# Patient Record
Sex: Male | Born: 1955 | Race: White | Hispanic: No | Marital: Single | Smoking: Current every day smoker
Health system: Southern US, Community
[De-identification: ages and names within clinical notes are randomized; demographics above are authoritative.]

## PROBLEM LIST (undated history)

## (undated) DIAGNOSIS — I517 Cardiomegaly: Secondary | ICD-10-CM

## (undated) DIAGNOSIS — I639 Cerebral infarction, unspecified: Secondary | ICD-10-CM

## (undated) DIAGNOSIS — R2231 Localized swelling, mass and lump, right upper limb: Secondary | ICD-10-CM

## (undated) DIAGNOSIS — E7800 Pure hypercholesterolemia, unspecified: Secondary | ICD-10-CM

## (undated) DIAGNOSIS — G510 Bell's palsy: Secondary | ICD-10-CM

## (undated) DIAGNOSIS — I2510 Atherosclerotic heart disease of native coronary artery without angina pectoris: Secondary | ICD-10-CM

## (undated) DIAGNOSIS — K8050 Calculus of bile duct without cholangitis or cholecystitis without obstruction: Secondary | ICD-10-CM

## (undated) DIAGNOSIS — I5189 Other ill-defined heart diseases: Secondary | ICD-10-CM

## (undated) DIAGNOSIS — I739 Peripheral vascular disease, unspecified: Secondary | ICD-10-CM

## (undated) DIAGNOSIS — I10 Essential (primary) hypertension: Secondary | ICD-10-CM

## (undated) DIAGNOSIS — K649 Unspecified hemorrhoids: Secondary | ICD-10-CM

## (undated) DIAGNOSIS — Z720 Tobacco use: Secondary | ICD-10-CM

## (undated) HISTORY — PX: VARICOSE VEIN SURGERY: SHX832

## (undated) HISTORY — DX: Atherosclerotic heart disease of native coronary artery without angina pectoris: I25.10

## (undated) HISTORY — DX: Cardiomegaly: I51.7

## (undated) HISTORY — DX: Peripheral vascular disease, unspecified: I73.9

## (undated) HISTORY — DX: Other ill-defined heart diseases: I51.89

---

## 2004-11-21 ENCOUNTER — Emergency Department: Payer: Self-pay | Admitting: General Practice

## 2007-02-05 ENCOUNTER — Emergency Department: Payer: Self-pay | Admitting: Emergency Medicine

## 2009-09-11 ENCOUNTER — Emergency Department: Payer: Self-pay | Admitting: Internal Medicine

## 2011-04-22 ENCOUNTER — Emergency Department: Payer: Self-pay | Admitting: Emergency Medicine

## 2011-04-22 LAB — CBC
HGB: 15.7 g/dL (ref 13.0–18.0)
Platelet: 186 10*3/uL (ref 150–440)
RBC: 4.85 10*6/uL (ref 4.40–5.90)
RDW: 12.5 % (ref 11.5–14.5)
WBC: 4.3 10*3/uL (ref 3.8–10.6)

## 2011-04-22 LAB — URINALYSIS, COMPLETE
Bilirubin,UR: NEGATIVE
Blood: NEGATIVE
Ketone: NEGATIVE
Protein: NEGATIVE
RBC,UR: <1 /HPF (ref 0–5)
Specific Gravity: 1.004 (ref 1.003–1.030)
Squamous Epithelial: NONE SEEN
WBC UR: NONE SEEN /HPF (ref 0–5)

## 2011-04-22 LAB — COMPREHENSIVE METABOLIC PANEL
Alkaline Phosphatase: 56 U/L (ref 50–136)
Anion Gap: 9 (ref 7–16)
Calcium, Total: 8.6 mg/dL (ref 8.5–10.1)
Chloride: 106 mmol/L (ref 98–107)
Co2: 28 mmol/L (ref 21–32)
EGFR (African American): >60
Glucose: 99 mg/dL (ref 65–99)
Potassium: 3.8 mmol/L (ref 3.5–5.1)
SGOT(AST): 21 U/L (ref 15–37)

## 2011-04-26 ENCOUNTER — Emergency Department: Payer: Self-pay | Admitting: Emergency Medicine

## 2011-04-26 LAB — COMPREHENSIVE METABOLIC PANEL
Alkaline Phosphatase: 68 U/L (ref 50–136)
Anion Gap: 8 (ref 7–16)
BUN: 14 mg/dL (ref 7–18)
Calcium, Total: 9.7 mg/dL (ref 8.5–10.1)
Chloride: 101 mmol/L (ref 98–107)
EGFR (African American): >60
EGFR (Non-African Amer.): >60
SGOT(AST): 17 U/L (ref 15–37)
SGPT (ALT): 26 U/L
Total Protein: 7.9 g/dL (ref 6.4–8.2)

## 2011-04-26 LAB — CBC
MCH: 32.1 pg (ref 26.0–34.0)
MCHC: 33.8 g/dL (ref 32.0–36.0)
MCV: 95 fL (ref 80–100)
Platelet: 213 10*3/uL (ref 150–440)
RBC: 4.73 10*6/uL (ref 4.40–5.90)
RDW: 13.6 % (ref 11.5–14.5)

## 2016-03-19 ENCOUNTER — Emergency Department: Payer: Self-pay

## 2016-03-19 ENCOUNTER — Observation Stay
Admission: EM | Admit: 2016-03-19 | Discharge: 2016-03-20 | Disposition: A | Discharge status: Home / Self Care | Payer: Self-pay | Attending: Internal Medicine | Admitting: Internal Medicine

## 2016-03-19 DIAGNOSIS — Z7902 Long term (current) use of antithrombotics/antiplatelets: Secondary | ICD-10-CM | POA: Insufficient documentation

## 2016-03-19 DIAGNOSIS — Z79899 Other long term (current) drug therapy: Secondary | ICD-10-CM | POA: Insufficient documentation

## 2016-03-19 DIAGNOSIS — Z9119 Patient's noncompliance with other medical treatment and regimen: Secondary | ICD-10-CM | POA: Insufficient documentation

## 2016-03-19 DIAGNOSIS — J40 Bronchitis, not specified as acute or chronic: Secondary | ICD-10-CM | POA: Insufficient documentation

## 2016-03-19 DIAGNOSIS — Z8673 Personal history of transient ischemic attack (TIA), and cerebral infarction without residual deficits: Secondary | ICD-10-CM | POA: Insufficient documentation

## 2016-03-19 DIAGNOSIS — Z791 Long term (current) use of non-steroidal anti-inflammatories (NSAID): Secondary | ICD-10-CM | POA: Insufficient documentation

## 2016-03-19 DIAGNOSIS — Z7982 Long term (current) use of aspirin: Secondary | ICD-10-CM | POA: Insufficient documentation

## 2016-03-19 DIAGNOSIS — R079 Chest pain, unspecified: Secondary | ICD-10-CM | POA: Diagnosis present

## 2016-03-19 DIAGNOSIS — Z8249 Family history of ischemic heart disease and other diseases of the circulatory system: Secondary | ICD-10-CM | POA: Insufficient documentation

## 2016-03-19 DIAGNOSIS — I200 Unstable angina: Secondary | ICD-10-CM | POA: Insufficient documentation

## 2016-03-19 DIAGNOSIS — I10 Essential (primary) hypertension: Secondary | ICD-10-CM | POA: Insufficient documentation

## 2016-03-19 DIAGNOSIS — G510 Bell's palsy: Secondary | ICD-10-CM | POA: Insufficient documentation

## 2016-03-19 DIAGNOSIS — F17210 Nicotine dependence, cigarettes, uncomplicated: Secondary | ICD-10-CM | POA: Insufficient documentation

## 2016-03-19 DIAGNOSIS — J111 Influenza due to unidentified influenza virus with other respiratory manifestations: Principal | ICD-10-CM | POA: Insufficient documentation

## 2016-03-19 HISTORY — DX: Tobacco use: Z72.0

## 2016-03-19 HISTORY — DX: Cerebral infarction, unspecified: I63.9

## 2016-03-19 HISTORY — DX: Bell's palsy: G51.0

## 2016-03-19 HISTORY — DX: Essential (primary) hypertension: I10

## 2016-03-19 LAB — BASIC METABOLIC PANEL
ANION GAP: 6 (ref 5–15)
BUN: 9 mg/dL (ref 6–20)
CALCIUM: 8.5 mg/dL — AB (ref 8.9–10.3)
CO2: 27 mmol/L (ref 22–32)
Chloride: 104 mmol/L (ref 101–111)
Creatinine, Ser: 1.03 mg/dL (ref 0.61–1.24)
GFR calc Af Amer: >60 mL/min (ref >60)
GLUCOSE: 112 mg/dL — AB (ref 65–99)
Potassium: 3.4 mmol/L — ABNORMAL LOW (ref 3.5–5.1)
SODIUM: 137 mmol/L (ref 135–145)

## 2016-03-19 LAB — CBC
HCT: 45.8 % (ref 40.0–52.0)
HEMOGLOBIN: 15.7 g/dL (ref 13.0–18.0)
MCH: 31.1 pg (ref 26.0–34.0)
MCHC: 34.4 g/dL (ref 32.0–36.0)
MCV: 90.4 fL (ref 80.0–100.0)
Platelets: 135 10*3/uL — ABNORMAL LOW (ref 150–440)
RBC: 5.06 MIL/uL (ref 4.40–5.90)
RDW: 13.8 % (ref 11.5–14.5)
WBC: 2.9 10*3/uL — ABNORMAL LOW (ref 3.8–10.6)

## 2016-03-19 LAB — TROPONIN I
TROPONIN I: 0.1 ng/mL — AB (ref <0.03)
Troponin I: 0.09 ng/mL (ref <0.03)

## 2016-03-19 LAB — INFLUENZA PANEL BY PCR (TYPE A & B)
Influenza A By PCR: NEGATIVE
Influenza B By PCR: POSITIVE — AB

## 2016-03-19 MED ORDER — BISACODYL 10 MG RE SUPP: 10.0000 mg | Freq: Every day | RECTAL | Status: DC | PRN

## 2016-03-19 MED ORDER — ONDANSETRON HCL 4 MG/2ML IJ SOLN: 4.0000 mg | Freq: Four times a day (QID) | INTRAMUSCULAR | Status: DC | PRN

## 2016-03-19 MED ORDER — AMLODIPINE BESYLATE 10 MG PO TABS
10.0000 mg | ORAL_TABLET | Freq: Every day | ORAL | Status: DC
  Administered 2016-03-19 – 2016-03-20 (×2): 10 mg via ORAL
  Filled 2016-03-19 (×2): qty 1

## 2016-03-19 MED ORDER — METHYLPREDNISOLONE SODIUM SUCC 125 MG IJ SOLR
60.0000 mg | Freq: Every day | INTRAMUSCULAR | Status: DC
Start: 2016-03-19 — End: 2016-03-20
  Administered 2016-03-19 – 2016-03-20 (×2): 60 mg via INTRAVENOUS
  Filled 2016-03-19 (×2): qty 2

## 2016-03-19 MED ORDER — SODIUM CHLORIDE 0.9% FLUSH: 3.0000 mL | INTRAVENOUS | Status: DC | PRN

## 2016-03-19 MED ORDER — OSELTAMIVIR PHOSPHATE 75 MG PO CAPS
75.0000 mg | ORAL_CAPSULE | Freq: Two times a day (BID) | ORAL | Status: DC
  Administered 2016-03-19 – 2016-03-20 (×2): 75 mg via ORAL
  Filled 2016-03-19 (×2): qty 1

## 2016-03-19 MED ORDER — NITROGLYCERIN 0.4 MG SL SUBL: 0.4000 mg | SUBLINGUAL_TABLET | SUBLINGUAL | Status: DC | PRN

## 2016-03-19 MED ORDER — ENOXAPARIN SODIUM 40 MG/0.4ML ~~LOC~~ SOLN
40.0000 mg | SUBCUTANEOUS | Status: DC
  Administered 2016-03-19: 40 mg via SUBCUTANEOUS
  Filled 2016-03-19: qty 0.4

## 2016-03-19 MED ORDER — IBUPROFEN 400 MG PO TABS: 400.0000 mg | ORAL_TABLET | Freq: Four times a day (QID) | ORAL | Status: DC | PRN

## 2016-03-19 MED ORDER — ASPIRIN 81 MG PO CHEW
324.0000 mg | CHEWABLE_TABLET | Freq: Once | ORAL | Status: AC
  Administered 2016-03-19: 324 mg via ORAL
  Filled 2016-03-19: qty 4

## 2016-03-19 MED ORDER — ACETAMINOPHEN 650 MG RE SUPP: 650.0000 mg | Freq: Four times a day (QID) | RECTAL | Status: DC | PRN

## 2016-03-19 MED ORDER — ASPIRIN EC 81 MG PO TBEC
81.0000 mg | DELAYED_RELEASE_TABLET | Freq: Every day | ORAL | Status: DC
  Administered 2016-03-20: 81 mg via ORAL
  Filled 2016-03-19: qty 1

## 2016-03-19 MED ORDER — IPRATROPIUM-ALBUTEROL 0.5-2.5 (3) MG/3ML IN SOLN: 3.0000 mL | RESPIRATORY_TRACT | Status: DC | PRN

## 2016-03-19 MED ORDER — ACETAMINOPHEN 325 MG PO TABS: 650.0000 mg | ORAL_TABLET | Freq: Four times a day (QID) | ORAL | Status: DC | PRN

## 2016-03-19 MED ORDER — ONDANSETRON HCL 4 MG PO TABS: 4.0000 mg | ORAL_TABLET | Freq: Four times a day (QID) | ORAL | Status: DC | PRN

## 2016-03-19 MED ORDER — NITROGLYCERIN 2 % TD OINT
1.0000 [in_us] | TOPICAL_OINTMENT | Freq: Once | TRANSDERMAL | Status: AC
  Administered 2016-03-19: 1 [in_us] via TOPICAL
  Filled 2016-03-19: qty 1

## 2016-03-19 MED ORDER — LISINOPRIL 20 MG PO TABS
40.0000 mg | ORAL_TABLET | Freq: Every day | ORAL | Status: DC
  Administered 2016-03-19 – 2016-03-20 (×2): 40 mg via ORAL
  Filled 2016-03-19 (×2): qty 2

## 2016-03-19 MED ORDER — GABAPENTIN 100 MG PO CAPS
100.0000 mg | ORAL_CAPSULE | Freq: Three times a day (TID) | ORAL | Status: DC
  Administered 2016-03-19 – 2016-03-20 (×2): 100 mg via ORAL
  Filled 2016-03-19 (×2): qty 1

## 2016-03-19 MED ORDER — SODIUM CHLORIDE 0.9% FLUSH
3.0000 mL | Freq: Two times a day (BID) | INTRAVENOUS | Status: DC
  Administered 2016-03-19 – 2016-03-20 (×2): 3 mL via INTRAVENOUS

## 2016-03-19 MED ORDER — POLYETHYLENE GLYCOL 3350 17 G PO PACK: 17.0000 g | PACK | Freq: Every day | ORAL | Status: DC | PRN

## 2016-03-19 MED ORDER — SODIUM CHLORIDE 0.9% FLUSH: 3.0000 mL | Freq: Two times a day (BID) | INTRAVENOUS | Status: DC

## 2016-03-19 MED ORDER — HYDRALAZINE HCL 20 MG/ML IJ SOLN: 10.0000 mg | Freq: Four times a day (QID) | INTRAMUSCULAR | Status: DC | PRN

## 2016-03-19 MED ORDER — SODIUM CHLORIDE 0.9 % IV SOLN: 250.0000 mL | INTRAVENOUS | Status: DC | PRN

## 2016-03-19 NOTE — H&P (Signed)
[place] [facility] [place] NAME: Miguel Fletcher    MR#:  [id number]  DATE OF BIRTH:  06-Sep-1955  DATE OF ADMISSION:  03/19/2016  PRIMARY CARE PHYSICIAN: No primary care provider on file.   REQUESTING/REFERRING PHYSICIAN: Dr. Marcelene Butte  CHIEF COMPLAINT:   Chief Complaint  Patient presents with  . Chest Pain    HISTORY OF PRESENT ILLNESS:  Miguel Fletcher  is a 60 y.o. male with a known history of Hypertension, Bell's palsy, CVA, tobacco use presents to the emergency room complaining of 3 weeks of on and off chest pain lasting 30 seconds to a minute. This is worse when he coughs. He has also noticed on and off coughing and wheezing. Continues to smoke. Afebrile. Patient was found to have troponin of 0.10 in the emergency room. EKG shows chronic changes. Influenza B positive. Accelerated hypertension as patient has not taken his medications for many weeks.  PAST MEDICAL HISTORY:   Past Medical History:  Diagnosis Date  . Bell's palsy   . Hypertension   . Stroke (cerebrum) ([place])   . Tobacco use     PAST SURGICAL HISTORY:  History reviewed. No pertinent surgical history.  SOCIAL HISTORY:   Social History  Substance Use Topics  . Smoking status: Current Every Day Smoker    Packs/day: 2.00  . Smokeless tobacco: Never Used  . Alcohol use No    FAMILY HISTORY:   Family History  Problem Relation Age of Onset  . CAD Neg Hx     DRUG ALLERGIES:  No Known Allergies  REVIEW OF SYSTEMS:   Review of Systems  Constitutional: Positive for malaise/fatigue. Negative for chills, fever and weight loss.  HENT: Negative for hearing loss and nosebleeds.   Eyes: Negative for blurred vision, double vision and pain.  Respiratory: Positive for cough, sputum production and wheezing. Negative for hemoptysis and shortness of breath.   Cardiovascular: Positive for chest pain. Negative for palpitations, orthopnea and leg swelling.  Gastrointestinal:  Negative for abdominal pain, constipation, diarrhea, nausea and vomiting.  Genitourinary: Negative for dysuria and hematuria.  Musculoskeletal: Negative for back pain, falls and myalgias.  Skin: Negative for rash.  Neurological: Positive for weakness. Negative for dizziness, tremors, sensory change, speech change, focal weakness, seizures and headaches.  Endo/Heme/Allergies: Does not bruise/bleed easily.  Psychiatric/Behavioral: Negative for depression and memory loss. The patient is not nervous/anxious.     MEDICATIONS AT HOME:   Prior to Admission medications   Medication Sig Start Date End Date Taking? Authorizing Provider  amLODipine (NORVASC) 10 MG tablet Take 10 mg by mouth daily. 08/19/15  Yes Historical Provider, MD  aspirin EC 81 MG tablet Take 81 mg by mouth daily. 08/04/14  Yes Historical Provider, MD  dextromethorphan-guaiFENesin (MUCINEX DM) 30-600 MG 12hr tablet Take 1 tablet by mouth 2 (two) times daily as needed for cough.   Yes Historical Provider, MD  gabapentin (NEURONTIN) 100 MG capsule Take 100 mg by mouth 3 (three) times daily.   Yes Historical Provider, MD  hydrochlorothiazide (HYDRODIURIL) 25 MG tablet Take 25 mg by mouth daily.   Yes Historical Provider, MD  Ibuprofen (ADVIL PO) Take 1 tablet by mouth every 4 (four) hours as needed (pain).   Yes Historical Provider, MD  lisinopril (PRINIVIL,ZESTRIL) 20 MG tablet Take 20 mg by mouth daily. 07/08/15  Yes Historical Provider, MD  nitroGLYCERIN (NITROSTAT) 0.4 MG SL tablet Place 0.4 mg under the tongue every 5 (five) minutes x 3 doses as needed.  08/04/14  Yes Historical Provider, MD     VITAL SIGNS:  Blood pressure (!) 196/77, pulse 64, temperature 99.9 F (37.7 C), temperature source Oral, resp. rate 16, height 5\' 9"  (1.753 m), weight 99.8 kg (220 lb), SpO2 94 %.  PHYSICAL EXAMINATION:  Physical Exam  GENERAL:  60 y.o.-year-old patient lying in the bed with no acute distress.  EYES: Pupils equal, round, reactive to  light and accommodation. No scleral icterus. Extraocular muscles intact.  HEENT: Head atraumatic, normocephalic. Oropharynx and nasopharynx clear. No oropharyngeal erythema, moist oral mucosa  NECK:  Supple, no jugular venous distention. No thyroid enlargement, no tenderness.  LUNGS: Bilateral wheezing. Good air entry. CARDIOVASCULAR: S1, S2 normal. No murmurs, rubs, or gallops.  ABDOMEN: Soft, nontender, nondistended. Bowel sounds present. No organomegaly or mass.  EXTREMITIES: No pedal edema, cyanosis, or clubbing. + 2 pedal & radial pulses b/l.   NEUROLOGIC: Cranial nerves II through XII are intact. No focal Motor or sensory deficits appreciated b/l PSYCHIATRIC: The patient is alert and oriented x 3. Good affect.  SKIN: No obvious rash, lesion, or ulcer.   LABORATORY PANEL:   CBC  Recent Labs Lab 03/19/16 1712  WBC 2.9*  HGB 15.7  HCT 45.8  PLT 135*   ------------------------------------------------------------------------------------------------------------------  Chemistries   Recent Labs Lab 03/19/16 1712  NA 137  K 3.4*  CL 104  CO2 27  GLUCOSE 112*  BUN 9  CREATININE 1.03  CALCIUM 8.5*   ------------------------------------------------------------------------------------------------------------------  Cardiac Enzymes  Recent Labs Lab 03/19/16 1712  TROPONINI 0.10*   ------------------------------------------------------------------------------------------------------------------  RADIOLOGY:  Dg Chest 2 View  Result Date: 03/19/2016 CLINICAL DATA:  Chest pain and shortness of breath.  Cough. EXAM: CHEST  2 VIEW COMPARISON:  04/22/2011 FINDINGS: The heart size and mediastinal contours are within normal limits. Both lungs are clear. Slight thoracolumbar scoliosis. IMPRESSION: No active cardiopulmonary disease. Electronically Signed   By: Lorriane Shire M.D.   On: 03/19/2016 18:30   IMPRESSION AND PLAN:   * Chest pain with mild elevated of  troponin Atypical chest pain. Likely due to accelerated hypertension and influenza Admit to telemetry. Repeat troponin. Start aspirin. Check echocardiogram and consult cardiology. If patient has further significant elevation in troponin he eats to be started on full dose Lovenox.  * Influenza B Start Tamiflu. Solu-Medrol. Nebs when necessary.  * Accelerated hypertension Restart patient's amlodipine and lisinopril from outpatient. Invalid blood pressure still elevated consider hydrochlorothiazide tomorrow. Add IV when necessary medications.  * DVT prophylaxis with Lovenox  All the records are reviewed and case discussed with ED provider. Management plans discussed with the patient, family and they are in agreement.  CODE STATUS: FULL CODE  TOTAL TIME TAKING CARE OF THIS PATIENT: 40 minutes.   Hillary Bow R M.D on 03/19/2016 at 8:02 PM  Between 7am to 6pm - Pager - [phone]  After 6pm go to [email or web address] - password [email or web address] [facility]  Office  [phone]  CC: Primary care physician; No primary care provider on file.  Note: This dictation was prepared with Dragon dictation along with smaller phrase technology. Any transcriptional errors that result from this process are unintentional.

## 2016-03-19 NOTE — ED Provider Notes (Signed)
Time Seen: Approximately 1826  I have reviewed the triage notes  Chief Complaint: Chest Pain   History of Present Illness: Miguel Fletcher is a 60 y.o. male who presents with some intermittent chest discomfort now for the last approximately week or more. He describes the episodes lasting anywhere from 15-20 minutes mainly substernal radiating into both upper extremities. Patient's had some cough and cold symptoms and apparently has felt febrile at home without taking his temperature. He also has history of hypertension and has not currently been taking his blood pressure medications. Patient arrives to the emergency department today due to chest pain episode that started approximately an hour prior to arrival and again lasted approximately 20 minutes. Patient denies any substernal chest discomfort at this time. He denies any headaches or focal weakness.   Past Medical History:  Diagnosis Date  . Hypertension   . Stroke (cerebrum) ([place])     There are no active problems to display for this patient.   History reviewed. No pertinent surgical history.  History reviewed. No pertinent surgical history.    Allergies:  Patient has no known allergies.  Family History: No family history on file.  Social History: Social History  Substance Use Topics  . Smoking status: Current Every Day Smoker    Packs/day: 2.00  . Smokeless tobacco: Never Used  . Alcohol use No     Review of Systems:   10 point review of systems was performed and was otherwise negative:  Constitutional: No fever Eyes: No visual disturbances ENT: No sore throat, ear pain Cardiac: No chest pain Respiratory: NoCurrent shortness of breath, wheezing, or stridor. He does have a dry nonproductive cough. Abdomen: No abdominal pain, no vomiting, No diarrhea Endocrine: No weight loss, No night sweats Extremities: No peripheral edema, cyanosis Skin: No rashes, easy bruising Neurologic: No focal weakness, trouble  with speech or swollowing Urologic: No dysuria, Hematuria, or urinary frequency   Physical Exam:  ED Triage Vitals  Enc Vitals Group     BP 03/19/16 1707 (!) 196/77     Pulse Rate 03/19/16 1707 64     Resp 03/19/16 1707 16     Temp 03/19/16 1707 99.9 F (37.7 C)     Temp Source 03/19/16 1707 Oral     SpO2 03/19/16 1707 94 %     Weight 03/19/16 1708 220 lb (99.8 kg)     Height 03/19/16 1708 5\' 9"  (1.753 m)     Head Circumference --      Peak Flow --      Pain Score --      Pain Loc --      Pain Edu? --      Excl. in [place]? --     General: Awake , Alert , and Oriented times 3; GCS 15 Head: Normal cephalic , atraumatic Eyes: Pupils equal , round, reactive to light Nose/Throat: No nasal drainage, patent upper airway without erythema or exudate.  Neck: Supple, Full range of motion, No anterior adenopathy or palpable thyroid masses Lungs: Clear to ascultation without wheezes , rhonchi, or rales Heart: Regular rate, regular rhythm without murmurs , gallops , or rubs Abdomen: Soft, non tender without rebound, guarding , or rigidity; bowel sounds positive and symmetric in all 4 quadrants. No organomegaly .        Extremities: 2 plus symmetric pulses. No edema, clubbing or cyanosis Neurologic: normal ambulation, Motor symmetric without deficits, sensory intact Skin: warm, dry, no rashes No obvious reproducible chest wall  pain  Labs:   All laboratory work was reviewed including any pertinent negatives or positives listed below:  Labs Reviewed  BASIC METABOLIC PANEL - Abnormal; Notable for the following:       Result Value   Potassium 3.4 (*)    Glucose, Bld 112 (*)    Calcium 8.5 (*)    All other components within normal limits  CBC - Abnormal; Notable for the following:    WBC 2.9 (*)    Platelets 135 (*)    All other components within normal limits  TROPONIN I - Abnormal; Notable for the following:    Troponin I 0.10 (*)    All other components within normal limits   INFLUENZA PANEL BY PCR (TYPE A & B, H1N1)   Troponin I. His elevated EKG:  ED ECG REPORT I, Daymon Larsen, the attending physician, personally viewed and interpreted this ECG.  Date: 03/19/2016 EKG Time: 1705 Rate:65 Rhythm: normal sinus rhythm QRS Axis: normal Intervals: Left anterior fascicular block ST/T Wave abnormalities: normal Conduction Disturbances: none Narrative Interpretation: unremarkable Left ventricular hypertrophy Poor R-wave progression in the septal leads which appears to be new in comparison EKG performed on 04-22-2011 No acute ischemic changes Radiology:  "Dg Chest 2 View  Result Date: 03/19/2016 CLINICAL DATA:  Chest pain and shortness of breath.  Cough. EXAM: CHEST  2 VIEW COMPARISON:  04/22/2011 FINDINGS: The heart size and mediastinal contours are within normal limits. Both lungs are clear. Slight thoracolumbar scoliosis. IMPRESSION: No active cardiopulmonary disease. Electronically Signed   By: Lorriane Shire M.D.   On: 03/19/2016 18:30  "  I personally reviewed the radiologic studies   ED Course:  Patient's stay here was uneventful he started on nitroglycerin paste for both hypertension and treatment for what could be unstable angina. Patient doesn't appear to have any obvious ischemic changes on his EKG does troponin is positive. Differential includes all life-threatening causes for chest pain. This includes but is not exclusive to acute coronary syndrome, aortic dissection, pulmonary embolism, cardiac tamponade, community-acquired pneumonia, pericarditis, musculoskeletal chest wall pain, etc. Patient still awaiting a flu test and does not appear to have any signs of community acquired pneumonia. He presents with a very low-grade fever and a dry nonproductive cough. Which could be simple bronchitis. Clinical Course      Assessment: * Acute coronary syndrome Hypertension Noncompliance    Plan:  Inpatient           Daymon Larsen,  MD 03/19/16 [phone]

## 2016-03-19 NOTE — ED Triage Notes (Addendum)
Pt came to ED via pov c/o chest pain and sob on and off since today. Pt c/o tightness in both arms. Denies nausea, vomiting, but reports has had diarrhea all night long. Pt coughing in triage, reports chest pain hurts most when coughing. Reports history of MI many years ago. Pt blood pressure 196/77 in triage, pt reports not compliant with his medication.

## 2016-03-20 ENCOUNTER — Observation Stay
Admit: 2016-03-20 | Discharge: 2016-03-20 | Disposition: A | Discharge status: Home / Self Care | Payer: Self-pay | Attending: Internal Medicine | Admitting: Internal Medicine

## 2016-03-20 LAB — ECHOCARDIOGRAM COMPLETE
Height: 69 in
WEIGHTICAEL: 3361.6 [oz_av]

## 2016-03-20 LAB — TROPONIN I: Troponin I: 0.08 ng/mL (ref <0.03)

## 2016-03-20 MED ORDER — CLOPIDOGREL BISULFATE 75 MG PO TABS: 75.0000 mg | ORAL_TABLET | Freq: Every day | ORAL | 2 refills | Status: DC

## 2016-03-20 MED ORDER — OSELTAMIVIR PHOSPHATE 75 MG PO CAPS: 75.0000 mg | ORAL_CAPSULE | Freq: Two times a day (BID) | ORAL | 0 refills | Status: DC

## 2016-03-20 MED ORDER — TRAMADOL HCL 50 MG PO TABS: 50.0000 mg | ORAL_TABLET | Freq: Four times a day (QID) | ORAL | 0 refills | Status: DC | PRN

## 2016-03-20 NOTE — Progress Notes (Signed)
Discharge instructions given. IV and tele removed. Prescriptions given to patient. No questions at this time and patient verbalized understanding.

## 2016-03-20 NOTE — Discharge Summary (Signed)
[place] [facility] [place] NAME: Miguel Fletcher    MR#:  [id number]  DATE OF BIRTH:  1955-07-16  DATE OF ADMISSION:  03/19/2016   ADMITTING PHYSICIAN: Hillary Bow, MD  DATE OF DISCHARGE: 03/20/2016 12:19 PM  PRIMARY CARE PHYSICIAN: No PCP Per Patient   ADMISSION DIAGNOSIS:   Unstable angina ([place]) [I20.0]  DISCHARGE DIAGNOSIS:   Active Problems:   Chest pain   SECONDARY DIAGNOSIS:   Past Medical History:  Diagnosis Date  . Bell's palsy   . Hypertension   . Stroke (cerebrum) ([place])   . Tobacco use     HOSPITAL COURSE:   60 year old male with past medical history significant for hypertension, Bell's palsy, history of stroke presents to hospital secondary to cough and chest pain.  #1 chest pain-pleuritic chest pain from influenza and bronchitis. -Much improved. Troponins were slightly elevated on admission but plateaued. Likely demand ischemia. -Audiology consult is appreciated. Echocardiogram done and pending. -Outpatient follow-up with cardiology recommended this week. Continue aspirin, Plavix has been added.  #2 influenza positive-started on Tamiflu. Continue cough medications. Avoid exposure.  #3 hypertension-on lisinopril, hydrochlorothiazide, Norvasc   Patient has been very stable. Much improved symptoms. And has been very anxious to go home. After cleared by cardiology, is being discharged home.   DISCHARGE CONDITIONS:   Stable  CONSULTS OBTAINED:   Treatment Team:  Gladstone Lighter, MD Dionisio David, MD  DRUG ALLERGIES:   No Known Allergies DISCHARGE MEDICATIONS:   Allergies as of 03/20/2016   No Known Allergies     Medication List    STOP taking these medications   ADVIL PO     TAKE these medications   amLODipine 10 MG tablet Commonly known as:  NORVASC Take 10 mg by mouth daily.   aspirin EC 81 MG tablet Take 81 mg by mouth daily.   clopidogrel 75 MG tablet Commonly known as:   PLAVIX Take 1 tablet (75 mg total) by mouth daily.   dextromethorphan-guaiFENesin 30-600 MG 12hr tablet Commonly known as:  MUCINEX DM Take 1 tablet by mouth 2 (two) times daily as needed for cough.   gabapentin 100 MG capsule Commonly known as:  NEURONTIN Take 100 mg by mouth 3 (three) times daily.   hydrochlorothiazide 25 MG tablet Commonly known as:  HYDRODIURIL Take 25 mg by mouth daily.   lisinopril 20 MG tablet Commonly known as:  PRINIVIL,ZESTRIL Take 20 mg by mouth daily.   nitroGLYCERIN 0.4 MG SL tablet Commonly known as:  NITROSTAT Place 0.4 mg under the tongue every 5 (five) minutes x 3 doses as needed.   oseltamivir 75 MG capsule Commonly known as:  TAMIFLU Take 1 capsule (75 mg total) by mouth 2 (two) times daily. X 4 more days   traMADol 50 MG tablet Commonly known as:  ULTRAM Take 1 tablet (50 mg total) by mouth every 6 (six) hours as needed for moderate pain or severe pain.        DISCHARGE INSTRUCTIONS:   1. Cardiology f/u in 2-3 days 2. PCP f/u in 1 week  DIET:   Cardiac diet  ACTIVITY:   Activity as tolerated  OXYGEN:   Home Oxygen: No.  Oxygen Delivery: room air  DISCHARGE LOCATION:   home   If you experience worsening of your admission symptoms, develop shortness of breath, life threatening emergency, suicidal or homicidal thoughts you must seek medical attention immediately by calling 911 or calling your MD immediately  if symptoms  less severe.  You Must read complete instructions/literature along with all the possible adverse reactions/side effects for all the Medicines you take and that have been prescribed to you. Take any new Medicines after you have completely understood and accpet all the possible adverse reactions/side effects.   Please note  You were cared for by a hospitalist during your hospital stay. If you have any questions about your discharge medications or the care you received while you were in the hospital after  you are discharged, you can call the unit and asked to speak with the hospitalist on call if the hospitalist that took care of you is not available. Once you are discharged, your primary care physician will handle any further medical issues. Please note that NO REFILLS for any discharge medications will be authorized once you are discharged, as it is imperative that you return to your primary care physician (or establish a relationship with a primary care physician if you do not have one) for your aftercare needs so that they can reassess your need for medications and monitor your lab values.    On the day of Discharge:  VITAL SIGNS:   Blood pressure (!) 135/59, pulse (!) 55, temperature 98.5 F (36.9 C), temperature source Oral, resp. rate 18, height 5\' 9"  (1.753 m), weight 95.3 kg (210 lb 1.6 oz), SpO2 95 %.  PHYSICAL EXAMINATION:    GENERAL:  60 y.o.-year-old patient lying in the bed with no acute distress.  EYES: Pupils equal, round, reactive to light and accommodation. No scleral icterus. Extraocular muscles intact.  HEENT: Head atraumatic, normocephalic. Oropharynx and nasopharynx clear.  NECK:  Supple, no jugular venous distention. No thyroid enlargement, no tenderness.  LUNGS: coarse breath sounds bilaterally at the bases, no wheezing, rales,rhonchi or crepitation. No use of accessory muscles of respiration.  CARDIOVASCULAR: S1, S2 normal. No murmurs, rubs, or gallops.  ABDOMEN: Soft, non-tender, non-distended. Bowel sounds present. No organomegaly or mass.  EXTREMITIES: No pedal edema, cyanosis, or clubbing.  NEUROLOGIC: Cranial nerves intact except right sided facial droop noted.. Muscle strength 5/5 in all extremities. Sensation intact. Gait not checked.  PSYCHIATRIC: The patient is alert and oriented x 3.  SKIN: No obvious rash, lesion, or ulcer.   DATA REVIEW:   CBC  Recent Labs Lab 03/19/16 1712  WBC 2.9*  HGB 15.7  HCT 45.8  PLT 135*    Chemistries   Recent  Labs Lab 03/19/16 1712  NA 137  K 3.4*  CL 104  CO2 27  GLUCOSE 112*  BUN 9  CREATININE 1.03  CALCIUM 8.5*     Microbiology Results  No results found for this or any previous visit.  RADIOLOGY:  Dg Chest 2 View  Result Date: 03/19/2016 CLINICAL DATA:  Chest pain and shortness of breath.  Cough. EXAM: CHEST  2 VIEW COMPARISON:  04/22/2011 FINDINGS: The heart size and mediastinal contours are within normal limits. Both lungs are clear. Slight thoracolumbar scoliosis. IMPRESSION: No active cardiopulmonary disease. Electronically Signed   By: Lorriane Shire M.D.   On: 03/19/2016 18:30     Management plans discussed with the patient, family and they are in agreement.  CODE STATUS:     Code Status Orders        Start     Ordered   03/19/16 1958  Full code  Continuous     03/19/16 1958    Code Status History    Date Active Date Inactive Code Status Order ID Comments User Context  This patient has a current code status but no historical code status.      TOTAL TIME TAKING CARE OF THIS PATIENT: 38 minutes.    Gladstone Lighter M.D on 03/20/2016 at 1:24 PM  Between 7am to 6pm - Pager - [phone]  After 6pm go to [email or web address] - [employer] [facility]  Office  [phone]  CC: Primary care physician; No PCP Per Patient   Note: This dictation was prepared with Dragon dictation along with smaller phrase technology. Any transcriptional errors that result from this process are unintentional.

## 2016-03-20 NOTE — Progress Notes (Signed)
*  PRELIMINARY RESULTS* Echocardiogram 2D Echocardiogram has been performed.  Miguel Fletcher 03/20/2016, 10:55 AM

## 2016-03-20 NOTE — Progress Notes (Signed)
Pt arrived via stretcher for ED. Pt A&Ox4. No complaints of cp, sob. Telemetry monitor applied and called to CCMD. Skin intact.Given room orientation, instructed on how to use call bell.

## 2016-03-20 NOTE — Progress Notes (Signed)
Pt refusing bed alarm. Educated on reason for bed alarm. Pt. Stated that he is alright and that he gets up through the night and walks around at home. Will continue to monitor and assess.

## 2016-03-20 NOTE — Progress Notes (Signed)
Miguel Fletcher is a 60 y.o. male  [id number]  Primary Cardiologist: Neoma Laming Reason for Consultation: Chest pain  HPI: This is a 60 year old white male with a past medical history of CVA presented to the hospital with sharp Chest pain associated with some shortness of breath. Chest pain is gone now. Chest pain would occur when he would cough and appeared to be pleuritic. Patient is having influenza at the same time.   Review of Systems: No chest pain at this time no shortness of breath   Past Medical History:  Diagnosis Date  . Bell's palsy   . Hypertension   . Stroke (cerebrum) ([place])   . Tobacco use     Medications Prior to Admission  Medication Sig Dispense Refill  . amLODipine (NORVASC) 10 MG tablet Take 10 mg by mouth daily.    Marland Kitchen aspirin EC 81 MG tablet Take 81 mg by mouth daily.    Marland Kitchen dextromethorphan-guaiFENesin (MUCINEX DM) 30-600 MG 12hr tablet Take 1 tablet by mouth 2 (two) times daily as needed for cough.    . gabapentin (NEURONTIN) 100 MG capsule Take 100 mg by mouth 3 (three) times daily.    . hydrochlorothiazide (HYDRODIURIL) 25 MG tablet Take 25 mg by mouth daily.    . Ibuprofen (ADVIL PO) Take 1 tablet by mouth every 4 (four) hours as needed (pain).    Marland Kitchen lisinopril (PRINIVIL,ZESTRIL) 20 MG tablet Take 20 mg by mouth daily.    . nitroGLYCERIN (NITROSTAT) 0.4 MG SL tablet Place 0.4 mg under the tongue every 5 (five) minutes x 3 doses as needed.       Marland Kitchen amLODipine  10 mg Oral Daily  . aspirin EC  81 mg Oral Daily  . enoxaparin (LOVENOX) injection  40 mg Subcutaneous Q24H  . gabapentin  100 mg Oral TID  . lisinopril  40 mg Oral Daily  . methylPREDNISolone (SOLU-MEDROL) injection  60 mg Intravenous Daily  . oseltamivir  75 mg Oral BID  . sodium chloride flush  3 mL Intravenous Q12H    Infusions:   No Known Allergies  Social History   Social History  . Marital status: Single    Spouse name: N/A  . Number of children: N/A  . Years of education:  N/A   Occupational History  . Not on file.   Social History Main Topics  . Smoking status: Current Every Day Smoker    Packs/day: 2.00  . Smokeless tobacco: Never Used  . Alcohol use No  . Drug use: No  . Sexual activity: Not on file   Other Topics Concern  . Not on file   Social History Narrative  . No narrative on file    Family History  Problem Relation Age of Onset  . CAD Neg Hx     PHYSICAL EXAM: Vitals:   03/20/16 0011 03/20/16 0510  BP: (!) 172/80 (!) 135/59  Pulse: 60 (!) 55  Resp:  18  Temp:  98.5 F (36.9 C)     Intake/Output Summary (Last 24 hours) at 03/20/16 1047 Last data filed at 03/20/16 0731  Gross per 24 hour  Intake                0 ml  Output              950 ml  Net             -950 ml    General:  Well appearing. No respiratory difficulty  HEENT: normal Neck: supple. no JVD. Carotids 2+ bilat; no bruits. No lymphadenopathy or thryomegaly appreciated. Cor: PMI nondisplaced. Regular rate & rhythm. No rubs, gallops or murmurs. Lungs: clear Abdomen: soft, nontender, nondistended. No hepatosplenomegaly. No bruits or masses. Good bowel sounds. Extremities: no cyanosis, clubbing, rash, edema Neuro: alert & oriented x 3, cranial nerves grossly intact. moves all 4 extremities w/o difficulty. Affect pleasant.  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  Results for orders placed or performed during the hospital encounter of 03/19/16 (from the past 24 hour(s))  Basic metabolic panel     Status: Abnormal   Collection Time: 03/19/16  5:12 PM  Result Value Ref Range   Sodium 137 135 - 145 mmol/L   Potassium 3.4 (L) 3.5 - 5.1 mmol/L   Chloride 104 101 - 111 mmol/L   CO2 27 22 - 32 mmol/L   Glucose, Bld 112 (H) 65 - 99 mg/dL   BUN 9 6 - 20 mg/dL   Creatinine, Ser 1.03 0.61 - 1.24 mg/dL   Calcium 8.5 (L) 8.9 - 10.3 mg/dL   GFR calc non Af Amer >60 >60 mL/min   GFR calc Af Amer >60 >60 mL/min   Anion  gap 6 5 - 15  CBC     Status: Abnormal   Collection Time: 03/19/16  5:12 PM  Result Value Ref Range   WBC 2.9 (L) 3.8 - 10.6 K/uL   RBC 5.06 4.40 - 5.90 MIL/uL   Hemoglobin 15.7 13.0 - 18.0 g/dL   HCT 45.8 40.0 - 52.0 %   MCV 90.4 80.0 - 100.0 fL   MCH 31.1 26.0 - 34.0 pg   MCHC 34.4 32.0 - 36.0 g/dL   RDW 13.8 11.5 - 14.5 %   Platelets 135 (L) 150 - 440 K/uL  Troponin I     Status: Abnormal   Collection Time: 03/19/16  5:12 PM  Result Value Ref Range   Troponin I 0.10 (HH) <0.03 ng/mL  Influenza panel by PCR (type A & B, H1N1)     Status: Abnormal   Collection Time: 03/19/16  6:51 PM  Result Value Ref Range   Influenza A By PCR NEGATIVE NEGATIVE   Influenza B By PCR POSITIVE (A) NEGATIVE  Troponin I     Status: Abnormal   Collection Time: 03/19/16 10:30 PM  Result Value Ref Range   Troponin I 0.09 (HH) <0.03 ng/mL  Troponin I     Status: Abnormal   Collection Time: 03/20/16  4:30 AM  Result Value Ref Range   Troponin I 0.08 (HH) <0.03 ng/mL   Dg Chest 2 View  Result Date: 03/19/2016 CLINICAL DATA:  Chest pain and shortness of breath.  Cough. EXAM: CHEST  2 VIEW COMPARISON:  04/22/2011 FINDINGS: The heart size and mediastinal contours are within normal limits. Both lungs are clear. Slight thoracolumbar scoliosis. IMPRESSION: No active cardiopulmonary disease. Electronically Signed   By: Lorriane Shire M.D.   On: 03/19/2016 18:30     ASSESSMENT AND PLAN:Atypical chest pain mostly pleuritic in the setting of influenza with mildly elevated troponin due to demand ischemia. Advise patient on aspirin and Plavix and nitrates and can be discharged with follow-up in the office on Thursday at 1:30 PM.  [name]

## 2018-02-17 IMAGING — CR DG CHEST 2V
2 series · 2 of 2 positions shown · non-contrast
Comparison: 04/22/2011

CLINICAL DATA: Chest pain and shortness of breath.  Cough.

EXAM:
CHEST  2 VIEW

[chest pa]
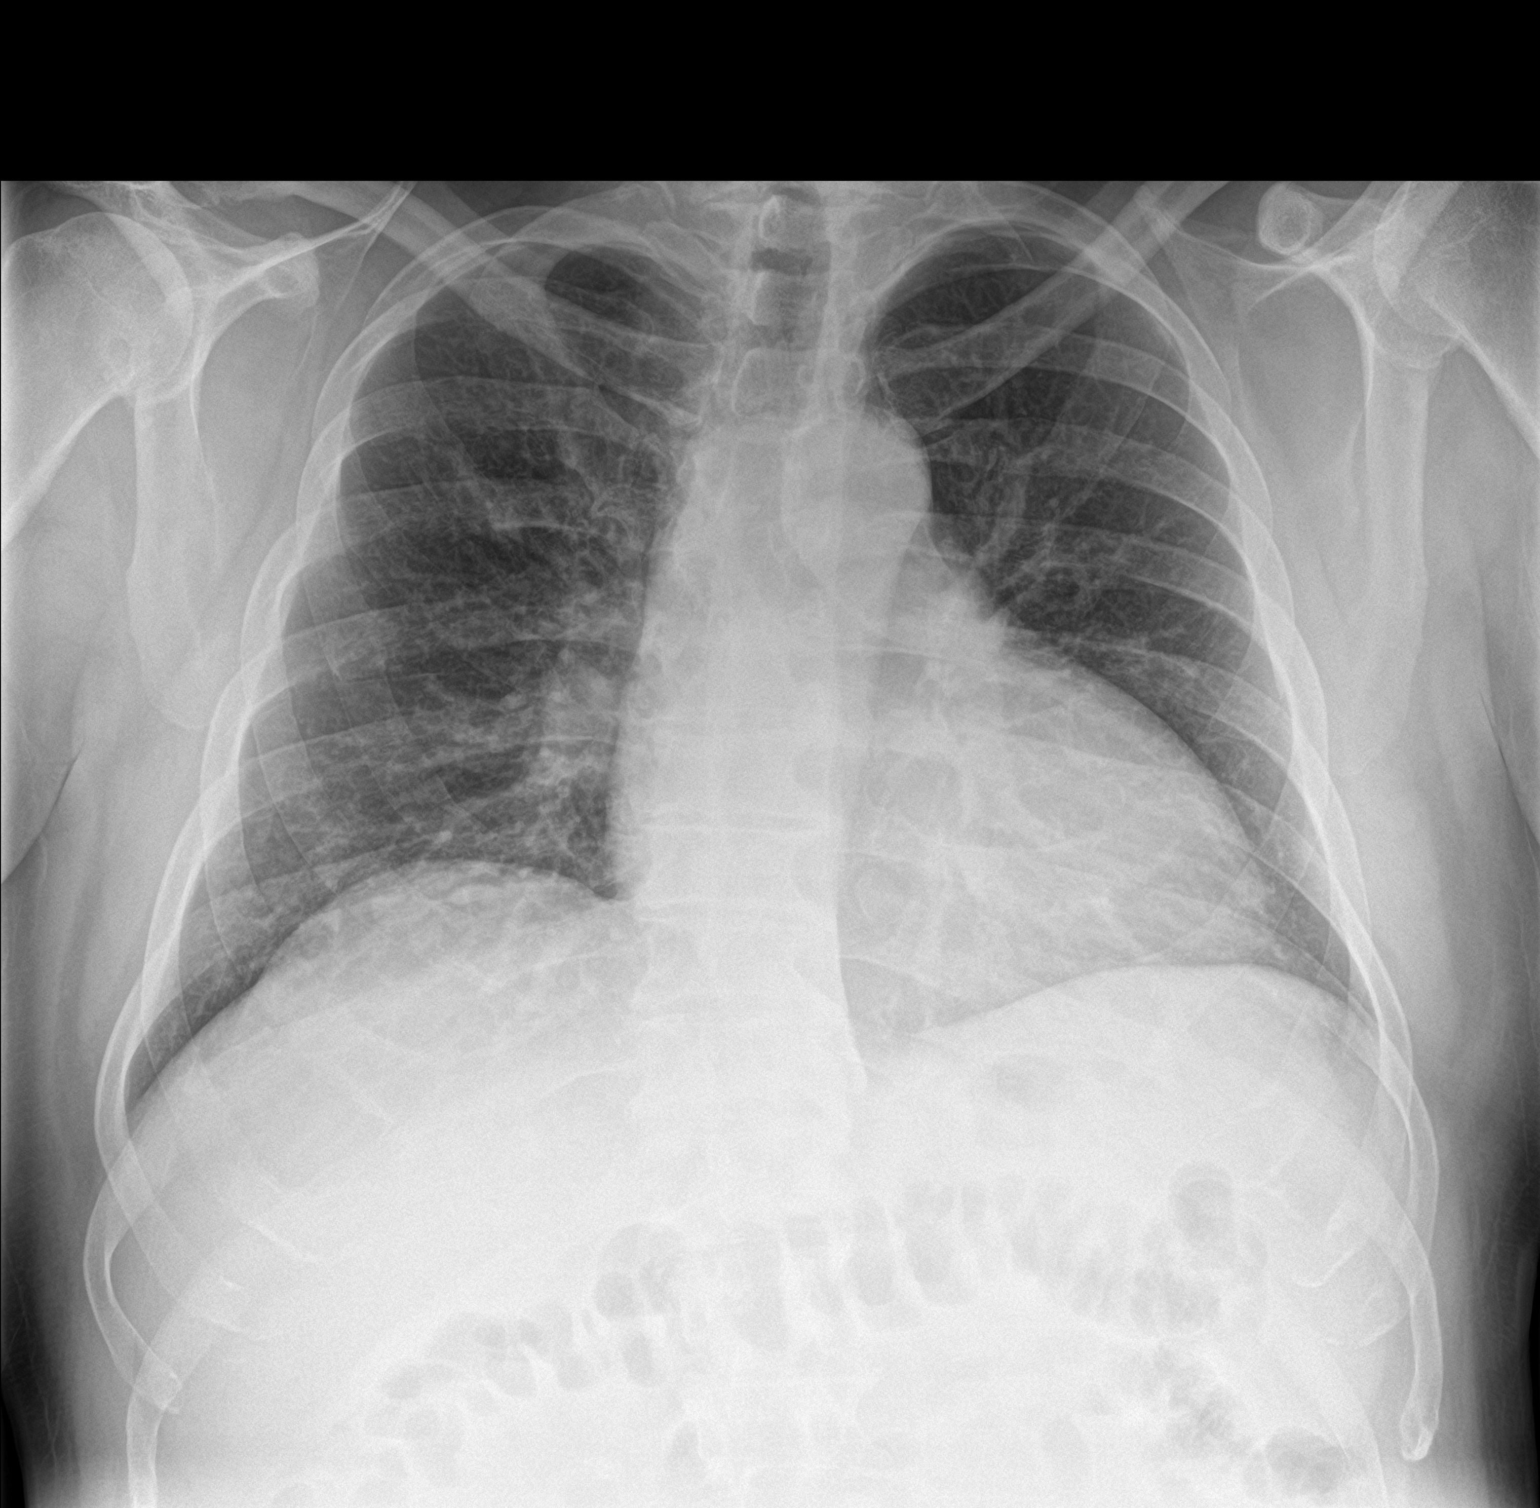

[chest lat]
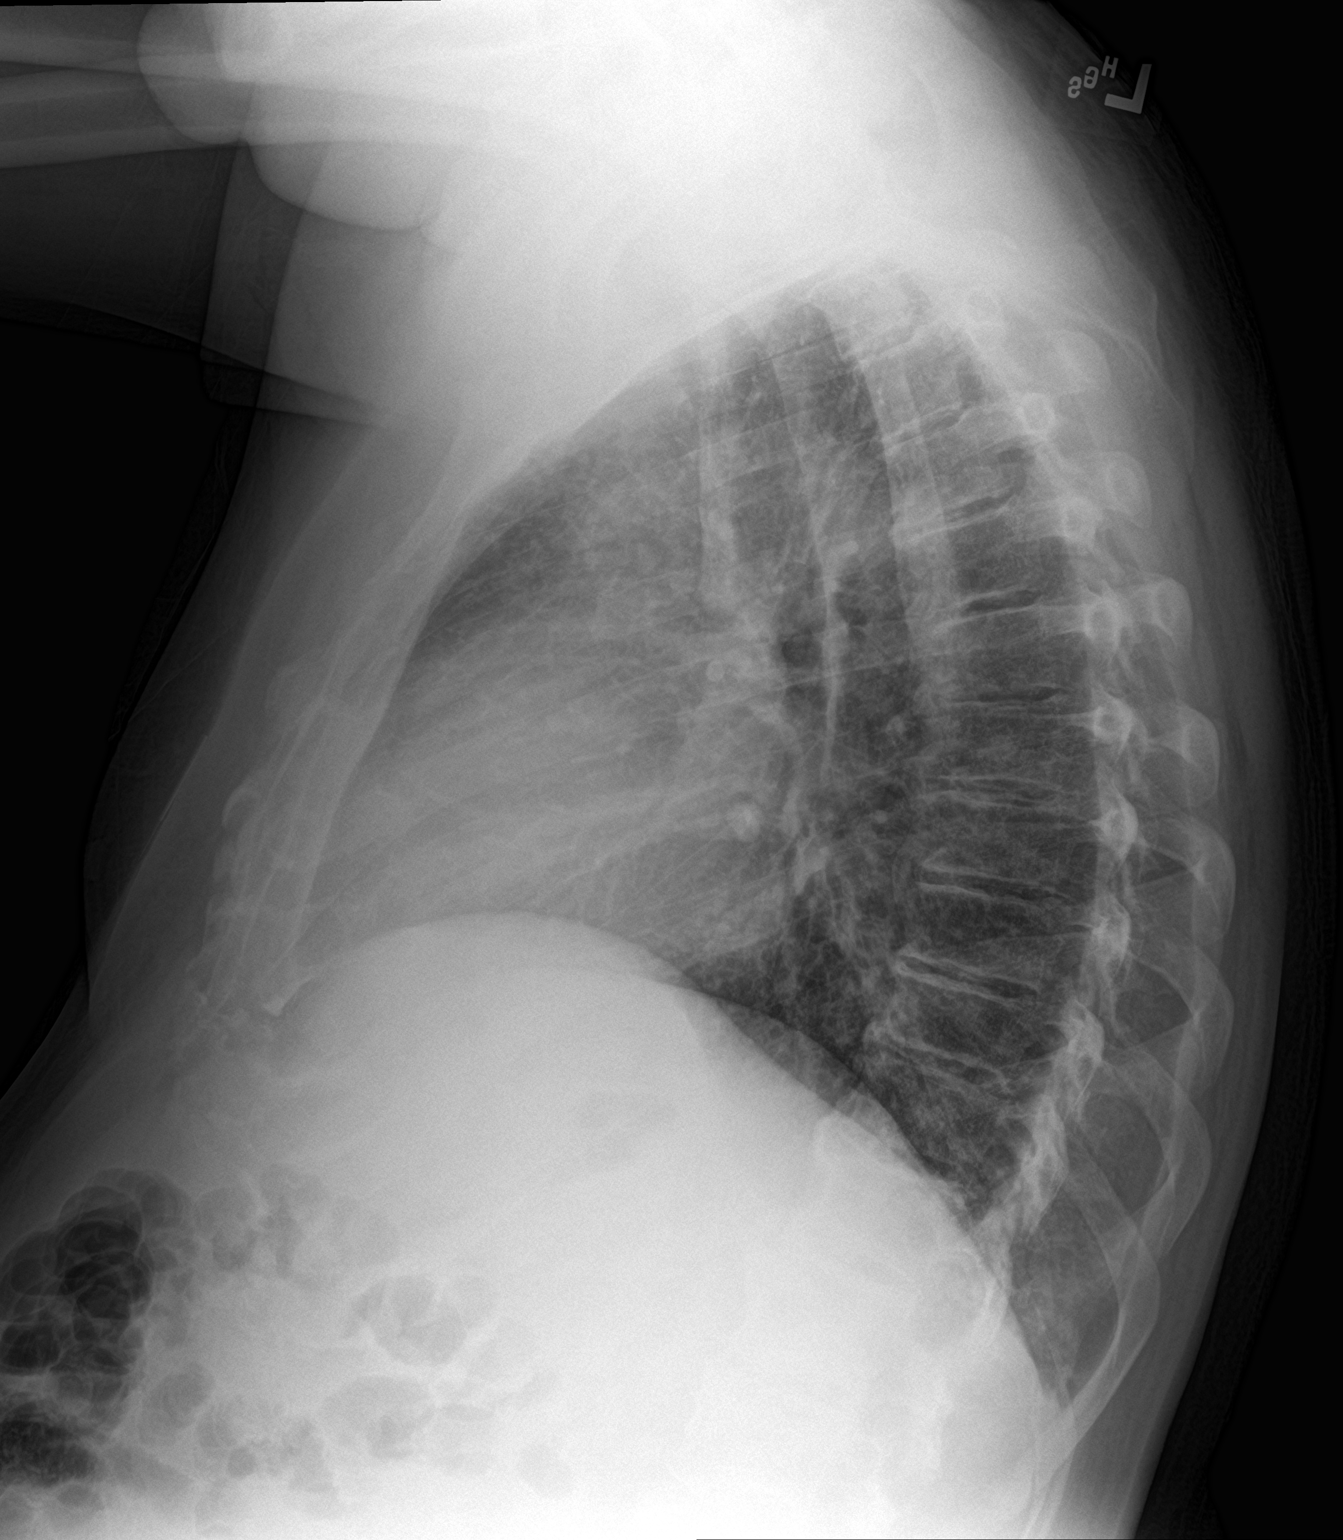

[2 of 2 positions shown; findings below may reference images not displayed]

FINDINGS: The heart size and mediastinal contours are within normal limits.
Both lungs are clear. Slight thoracolumbar scoliosis.
IMPRESSION: No active cardiopulmonary disease.

## 2018-05-09 ENCOUNTER — Emergency Department
Admission: EM | Admit: 2018-05-09 | Discharge: 2018-05-09 | Disposition: A | Discharge status: Home / Self Care | Payer: Self-pay | Attending: Emergency Medicine | Admitting: Emergency Medicine

## 2018-05-09 ENCOUNTER — Emergency Department: Payer: Self-pay

## 2018-05-09 ENCOUNTER — Encounter: Payer: Self-pay | Admitting: Emergency Medicine

## 2018-05-09 DIAGNOSIS — K8050 Calculus of bile duct without cholangitis or cholecystitis without obstruction: Secondary | ICD-10-CM | POA: Insufficient documentation

## 2018-05-09 DIAGNOSIS — I10 Essential (primary) hypertension: Secondary | ICD-10-CM | POA: Insufficient documentation

## 2018-05-09 DIAGNOSIS — K8020 Calculus of gallbladder without cholecystitis without obstruction: Secondary | ICD-10-CM | POA: Insufficient documentation

## 2018-05-09 DIAGNOSIS — F17210 Nicotine dependence, cigarettes, uncomplicated: Secondary | ICD-10-CM | POA: Insufficient documentation

## 2018-05-09 DIAGNOSIS — Z79899 Other long term (current) drug therapy: Secondary | ICD-10-CM | POA: Insufficient documentation

## 2018-05-09 LAB — CBC
HCT: 43.8 % (ref 39.0–52.0)
Hemoglobin: 14.7 g/dL (ref 13.0–17.0)
MCH: 30.7 pg (ref 26.0–34.0)
MCHC: 33.6 g/dL (ref 30.0–36.0)
MCV: 91.4 fL (ref 80.0–100.0)
NRBC: 0 % (ref 0.0–0.2)
PLATELETS: 206 10*3/uL (ref 150–400)
RBC: 4.79 MIL/uL (ref 4.22–5.81)
RDW: 13.2 % (ref 11.5–15.5)
WBC: 5.1 10*3/uL (ref 4.0–10.5)

## 2018-05-09 LAB — BASIC METABOLIC PANEL
Anion gap: 5 (ref 5–15)
BUN: 13 mg/dL (ref 8–23)
CALCIUM: 9.4 mg/dL (ref 8.9–10.3)
CO2: 31 mmol/L (ref 22–32)
Chloride: 103 mmol/L (ref 98–111)
Creatinine, Ser: 0.88 mg/dL (ref 0.61–1.24)
GFR calc Af Amer: >60 mL/min (ref >60)
GFR calc non Af Amer: >60 mL/min (ref >60)
GLUCOSE: 101 mg/dL — AB (ref 70–99)
Potassium: 3.2 mmol/L — ABNORMAL LOW (ref 3.5–5.1)
Sodium: 139 mmol/L (ref 135–145)

## 2018-05-09 LAB — HEPATIC FUNCTION PANEL
ALT: 18 U/L (ref 0–44)
AST: 20 U/L (ref 15–41)
Albumin: 4.3 g/dL (ref 3.5–5.0)
Alkaline Phosphatase: 69 U/L (ref 38–126)
Bilirubin, Direct: <0.1 mg/dL (ref 0.0–0.2)
Total Bilirubin: 0.9 mg/dL (ref 0.3–1.2)
Total Protein: 8 g/dL (ref 6.5–8.1)

## 2018-05-09 LAB — TROPONIN I: Troponin I: 0.03 ng/mL (ref <0.03)

## 2018-05-09 LAB — LIPASE, BLOOD: Lipase: 35 U/L (ref 11–51)

## 2018-05-09 MED ORDER — NAPROXEN 500 MG PO TABS: 500.0000 mg | ORAL_TABLET | Freq: Two times a day (BID) | ORAL | 0 refills | Status: DC

## 2018-05-09 MED ORDER — KETOROLAC TROMETHAMINE 30 MG/ML IJ SOLN
15.0000 mg | INTRAMUSCULAR | Status: AC
  Administered 2018-05-09: 15 mg via INTRAVENOUS
  Filled 2018-05-09: qty 1

## 2018-05-09 MED ORDER — FAMOTIDINE 20 MG PO TABS: 20.0000 mg | ORAL_TABLET | Freq: Two times a day (BID) | ORAL | 0 refills | Status: DC

## 2018-05-09 MED ORDER — ALUM & MAG HYDROXIDE-SIMETH 200-200-20 MG/5ML PO SUSP
30.0000 mL | Freq: Once | ORAL | Status: AC
  Administered 2018-05-09: 30 mL via ORAL
  Filled 2018-05-09: qty 30

## 2018-05-09 MED ORDER — FAMOTIDINE IN NACL 20-0.9 MG/50ML-% IV SOLN
20.0000 mg | Freq: Once | INTRAVENOUS | Status: AC
  Administered 2018-05-09: 20 mg via INTRAVENOUS
  Filled 2018-05-09: qty 50

## 2018-05-09 MED ORDER — ONDANSETRON 4 MG PO TBDP: 4.0000 mg | ORAL_TABLET | Freq: Three times a day (TID) | ORAL | 0 refills | Status: DC | PRN

## 2018-05-09 MED ORDER — ONDANSETRON HCL 4 MG/2ML IJ SOLN
4.0000 mg | Freq: Once | INTRAMUSCULAR | Status: AC
  Administered 2018-05-09: 4 mg via INTRAVENOUS
  Filled 2018-05-09: qty 2

## 2018-05-09 NOTE — ED Notes (Signed)
Patient returned from US.

## 2018-05-09 NOTE — ED Notes (Signed)
Patient to go to Rm 17, Sherlene Shams aware of placement and Troponin level.

## 2018-05-09 NOTE — ED Triage Notes (Signed)
Pt reports sharp pain to mid epigastric area that started a couple of days ago, radiates down to his right abd and gets worse every day.

## 2018-05-09 NOTE — ED Notes (Signed)
Patient transported to US.  Will continue to monitor.   

## 2018-05-09 NOTE — ED Notes (Signed)
Troponin 0.03, Charge Nurse notified.

## 2018-05-09 NOTE — ED Notes (Signed)
First Nurse Note: EKG was performed and read by Dr. Joni Fears.  Patient has had labs drawn and has been to Radiology.

## 2018-05-09 NOTE — ED Provider Notes (Addendum)
[facility] Emergency Department Provider Note  ____________________________________________  Time seen: Approximately 1:58 PM  I have reviewed the triage vital signs and the nursing notes.   HISTORY  Chief Complaint Abdominal Pain    HPI Miguel Fletcher is a 63 y.o. male with a history of hypertension, smoking, Bell's palsy who complains of  epigastric abdominal pain that started yesterday, constant, waxing and waning, no aggravating or alleviating factors.  Moderate to severe in intensity, sharp.  Nonradiating.  Reports decreased appetite today.  No fevers chills chest pain or shortness of breath.  No dizziness or syncope.     Past Medical History:  Diagnosis Date  . Bell's palsy   . Hypertension   . Stroke (cerebrum) ([place])   . Tobacco use      Patient Active Problem List   Diagnosis Date Noted  . Chest pain 03/19/2016     History reviewed. No pertinent surgical history.   Prior to Admission medications   Medication Sig Start Date End Date Taking? Authorizing Provider  amLODipine (NORVASC) 10 MG tablet Take 10 mg by mouth daily. 08/19/15  Yes [provider]  aspirin EC 81 MG tablet Take 81 mg by mouth daily. 08/04/14  Yes [provider]  gabapentin (NEURONTIN) 100 MG capsule Take 100 mg by mouth 3 (three) times daily.   Yes [provider]  lisinopril (PRINIVIL,ZESTRIL) 20 MG tablet Take 20 mg by mouth daily. 07/08/15  Yes [provider]  nitroGLYCERIN (NITROSTAT) 0.4 MG SL tablet Place 0.4 mg under the tongue every 5 (five) minutes x 3 doses as needed. 08/04/14  Yes [provider]  famotidine (PEPCID) 20 MG tablet Take 1 tablet (20 mg total) by mouth 2 (two) times daily. 05/09/18   Carrie Mew, MD  naproxen (NAPROSYN) 500 MG tablet Take 1 tablet (500 mg total) by mouth 2 (two) times daily with a meal. 05/09/18   Carrie Mew, MD  ondansetron (ZOFRAN ODT) 4 MG disintegrating tablet Take 1  tablet (4 mg total) by mouth every 8 (eight) hours as needed for nausea or vomiting. 05/09/18   Carrie Mew, MD     Allergies Patient has no known allergies.   Family History  Problem Relation Age of Onset  . CAD Neg Hx     Social History Social History   Tobacco Use  . Smoking status: Current Every Day Smoker    Packs/day: 2.00  . Smokeless tobacco: Never Used  Substance Use Topics  . Alcohol use: No  . Drug use: No    Review of Systems  Constitutional:   No fever or chills.  ENT:   No sore throat. No rhinorrhea. Cardiovascular:   No chest pain or syncope. Respiratory:   No dyspnea or cough. Gastrointestinal:   Positive as above for abdominal pain without vomiting and diarrhea.  Musculoskeletal:   Negative for focal pain or swelling All other systems reviewed and are negative except as documented above in ROS and HPI.  ____________________________________________   PHYSICAL EXAM:  VITAL SIGNS: ED Triage Vitals  Enc Vitals Group     BP 05/09/18 0837 (!) 217/80     Pulse Rate 05/09/18 0837 (!) 55     Resp 05/09/18 0837 16     Temp 05/09/18 0837 97.6 F (36.4 C)     Temp Source 05/09/18 0837 Oral     SpO2 05/09/18 0837 99 %     Weight 05/09/18 0837 230 lb (104.3 kg)     Height  05/09/18 0837 5\' 9"  (1.753 m)     Head Circumference --      Peak Flow --      Pain Score 05/09/18 0903 10     Pain Loc --      Pain Edu? --      Excl. in [place]? --     Vital signs reviewed, nursing assessments reviewed.   Constitutional:   Alert and oriented. Non-toxic appearance. Eyes:   Conjunctivae are normal. EOMI. PERRL. ENT      Head:   Normocephalic and atraumatic.      Nose:   No congestion/rhinnorhea.       Mouth/Throat:   MMM, no pharyngeal erythema. No peritonsillar mass.       Neck:   No meningismus. Full ROM. Hematological/Lymphatic/Immunilogical:   No cervical lymphadenopathy. Cardiovascular:   RRR. Symmetric bilateral radial and DP pulses.  No murmurs. Cap  refill less than 2 seconds. Respiratory:   Normal respiratory effort without tachypnea/retractions. Breath sounds are clear and equal bilaterally. No wheezes/rales/rhonchi. Gastrointestinal:   Soft with epigastric and right upper quadrant tenderness. Non distended. There is no CVA tenderness.  No rebound, rigidity, or guarding. Musculoskeletal:   Normal range of motion in all extremities. No joint effusions.  No lower extremity tenderness.  No edema. Neurologic:   Normal speech and language.  Motor grossly intact. No acute focal neurologic deficits are appreciated.  Skin:    Skin is warm, dry and intact. No rash noted.  No petechiae, purpura, or bullae.  ____________________________________________    LABS (pertinent positives/negatives) (all labs ordered are listed, but only abnormal results are displayed) Labs Reviewed  BASIC METABOLIC PANEL - Abnormal; Notable for the following components:      Result Value   Potassium 3.2 (*)    Glucose, Bld 101 (*)    All other components within normal limits  TROPONIN I - Abnormal; Notable for the following components:   Troponin I 0.03 (*)    All other components within normal limits  CBC  HEPATIC FUNCTION PANEL  LIPASE, BLOOD   ____________________________________________   EKG  Interpreted by me Sinus bradycardia rate of 51, left axis, first-degree AV block.  Poor R wave progression.  Normal ST segments.  T wave inversion in the lateral leads due to LVH.  ____________________________________________    RADIOLOGY  Dg Chest 2 View  Result Date: 05/09/2018 CLINICAL DATA:  Chest pain. EXAM: CHEST - 2 VIEW COMPARISON:  Radiographs of March 19, 2016. FINDINGS: The heart size and mediastinal contours are within normal limits. Both lungs are clear. No pneumothorax or pleural effusion is noted. The visualized skeletal structures are unremarkable. IMPRESSION: No active cardiopulmonary disease. Electronically Signed   By: Marijo Conception,  M.D.   On: 05/09/2018 09:46   US Abdomen Limited Ruq  Result Date: 05/09/2018 CLINICAL DATA:  Right upper quadrant pain EXAM: ULTRASOUND ABDOMEN LIMITED RIGHT UPPER QUADRANT COMPARISON:  September 11, 2009 FINDINGS: Gallbladder: Within the gallbladder, there is a 4 mm echogenic focus which moves and shadows consistent with a small gallstone. There are two 3 mm echogenic foci which do not move and shadow, consistent with small polyps. There is no gallbladder wall thickening or pericholecystic fluid. No sonographic Murphy sign noted by sonographer. Common bile duct: Diameter: 2 mm. No intrahepatic or extrahepatic biliary duct dilatation. Liver: No focal lesion identified. Within normal limits in parenchymal echogenicity. Portal vein is patent on color Doppler imaging with normal direction of blood flow towards the liver. IMPRESSION:  There is a 4 mm gallstone. No gallbladder wall thickening or pericholecystic fluid. There are two 3 mm polyps within the gallbladder. Per consensus guidelines, polyps of this small size do not warrant additional imaging surveillance. Study otherwise unremarkable. Electronically Signed   By: Lowella Grip III M.D.   On: 05/09/2018 11:19    ____________________________________________   PROCEDURES Procedures  ____________________________________________  DIFFERENTIAL DIAGNOSIS   Gastritis, pancreatitis, cholecystitis, choledocholithiasis.  Chronic hypertension  CLINICAL IMPRESSION / ASSESSMENT AND PLAN / ED COURSE  Medications ordered in the ED: Medications  famotidine (PEPCID) IVPB 20 mg premix (0 mg Intravenous Stopped 05/09/18 1254)  alum & mag hydroxide-simeth (MAALOX/MYLANTA) 200-200-20 MG/5ML suspension 30 mL (30 mLs Oral Given 05/09/18 1158)  ondansetron (ZOFRAN) injection 4 mg (4 mg Intravenous Given 05/09/18 1158)  ketorolac (TORADOL) 30 MG/ML injection 15 mg (15 mg Intravenous Given 05/09/18 1254)    Pertinent labs & imaging results that were available  during my care of the patient were reviewed by me and considered in my medical decision making (see chart for details).    Patient presents with upper abdominal pain and tenderness.  Doubt ACS PE dissection, no evidence of abdominal perforation or bowel obstruction.  Doubt AAA or dissection.  Treat patient symptomatically with an acids, check labs and ultrasound.E    ----------------------------------------- 2:03 PM on 05/09/2018 -----------------------------------------  Ultrasound shows a 4 mm gallstone but noninflamed gallbladder.  Labs are normal.  Vital signs unremarkable except for his severe hypertension.  He has not taken his blood pressure medicines today including lisinopril and amlodipine.  He is tolerating oral intake, symptoms are controlled, will discharge home to follow-up with general surgery.  Symptomatic support, bland diet.  ----------------------------------------- 2:37 PM on 05/09/2018 -----------------------------------------  Had a long discussion with the patient about managing the biliary colic and following up with general surgery.  Also strongly encouraged him to restart his blood pressure medicine.  He reports that he has not been taking them because it makes him feel dizzy.  Advised that he should start 1 of them such as the lisinopril first and then continue that daily for 5 to 7 days and then start the other 1 too with a goal blood pressure of 120/80.  Warned him of the risk of stroke heart attack renal failure and other serious diseases if he continues to allow his blood pressure to remain this high.  He understands and agrees.  Wife is at bedside as well.     ____________________________________________   FINAL CLINICAL IMPRESSION(S) / ED DIAGNOSES    Final diagnoses:  Biliary colic  Calculus of gallbladder without cholecystitis without obstruction     ED Discharge Orders         Ordered    naproxen (NAPROSYN) 500 MG tablet  2 times daily with  meals     05/09/18 1357    famotidine (PEPCID) 20 MG tablet  2 times daily     05/09/18 1357    ondansetron (ZOFRAN ODT) 4 MG disintegrating tablet  Every 8 hours PRN     05/09/18 1357          Portions of this note were generated with dragon dictation software. Dictation errors may occur despite best attempts at proofreading.   Carrie Mew, MD 05/09/18 1407    Carrie Mew, MD 05/09/18 1438

## 2018-05-09 NOTE — ED Notes (Signed)
First Nurse Note: Patient complaining of abdominal pain starting yesterday AM, states pain is "getting worse".  Denies N&V, states "it just hurts".  Declines WC.

## 2018-05-09 NOTE — Discharge Instructions (Signed)
Your labs today were okay.  The ultrasound of your gallbladder shows a 4 mm gallstone which is likely causing your symptoms.  Please follow-up with general surgery if you continue to have episodes of abdominal pain.  Avoid greasy foods and follow a simple diet for the next several days to avoid recurrence of the symptoms.  Taking an antacid medicine may also help.  Be sure to continue taking all of your blood pressure medicine at home.

## 2018-05-09 NOTE — ED Notes (Signed)
Pt was able to eat sandwich box without any difficulty.

## 2018-05-14 ENCOUNTER — Other Ambulatory Visit: Payer: Self-pay

## 2018-05-14 ENCOUNTER — Encounter: Payer: Self-pay | Admitting: General Surgery

## 2018-05-14 ENCOUNTER — Ambulatory Visit: Payer: Self-pay | Admitting: General Surgery

## 2018-05-14 ENCOUNTER — Encounter: Payer: Self-pay | Admitting: *Deleted

## 2018-05-14 VITALS — BP 234/98 | HR 71 | Temp 97.9°F | Ht 69.0 in | Wt 212.0 lb

## 2018-05-14 DIAGNOSIS — K8050 Calculus of bile duct without cholangitis or cholecystitis without obstruction: Secondary | ICD-10-CM

## 2018-05-14 NOTE — Progress Notes (Signed)
Patient's surgery to be scheduled for 06-01-18 at [facility] with Dr. Celine Ahr.  The patient is aware he will need to Pre-Admit. Patient will check in at the [facility], [address] (first floor). Patient will be contacted with date/time once arranged.   We will also request medical clearance from [facility] due to elevated blood pressure. A request has been faxed today.   The patient is aware to call the office should he have further questions.

## 2018-05-14 NOTE — Progress Notes (Signed)
Patient ID: Miguel Fletcher, male   DOB: 03-Oct-1955, 63 y.o.   MRN: [id number]  Chief Complaint  Patient presents with  . Other    biliary colic    HPI Miguel Fletcher is a 63 y.o. male.    He was seen in the emergency department last week with acute onset of right upper quadrant pain.  He says that it came on suddenly without any precipitating factors, such as eating fatty food.  He says the pain was right under his right rib cage and felt like somebody stabbing a needle in the area.  The pain did not radiate anywhere.  He denies any nausea or vomiting associated with the pain.  He did not have any diarrhea.  He states that he did not have any fevers or chills.  This was the first time that he had ever experienced this pain.  An ultrasound performed in the emergency department revealed cholelithiasis as well as a couple of small gallbladder polyps.  The polyps were small enough that they did not meet criteria for additional surveillance.  He is here today to discuss his biliary colic and surgical options.  He states that he continues to be in pain.  He points to the right upper quadrant in the mid clavicular line asked where the pain is.  He is able to eat and this does not exacerbate his discomfort.  He has not had any jaundice, pancreatitis, dark tea-colored urine, or acholic stools.  He has never had any abdominal surgery.    Of note, today his blood pressure is exceptionally high.  It was initially 245/99.  On recheck it was 234/98.  He states that he has not been taking his medication until just a few days ago.  He is supposed to be taking Norvasc 10 mg daily and lisinopril 20 mg daily.    Past Medical History:  Diagnosis Date  . Bell's palsy   . Hypertension   . Stroke (cerebrum) ([place])   . Tobacco use     History reviewed. No pertinent surgical history.  Family History  Problem Relation Age of Onset  . CAD Neg Hx     Social History Social History   Tobacco Use  . Smoking  status: Current Every Day Smoker    Packs/day: 2.00  . Smokeless tobacco: Never Used  Substance Use Topics  . Alcohol use: No  . Drug use: No    No Known Allergies  Current Outpatient Medications  Medication Sig Dispense Refill  . amLODipine (NORVASC) 10 MG tablet Take 10 mg by mouth daily.    Marland Kitchen aspirin EC 81 MG tablet Take 81 mg by mouth daily.    . famotidine (PEPCID) 20 MG tablet Take 1 tablet (20 mg total) by mouth 2 (two) times daily. 60 tablet 0  . gabapentin (NEURONTIN) 100 MG capsule Take 100 mg by mouth 3 (three) times daily.    Marland Kitchen lisinopril (PRINIVIL,ZESTRIL) 20 MG tablet Take 20 mg by mouth daily.    . naproxen (NAPROSYN) 500 MG tablet Take 1 tablet (500 mg total) by mouth 2 (two) times daily with a meal. 20 tablet 0  . nitroGLYCERIN (NITROSTAT) 0.4 MG SL tablet Place 0.4 mg under the tongue every 5 (five) minutes x 3 doses as needed.    . ondansetron (ZOFRAN ODT) 4 MG disintegrating tablet Take 1 tablet (4 mg total) by mouth every 8 (eight) hours as needed for nausea or vomiting. 20 tablet 0   No current  facility-administered medications for this visit.     Review of Systems Review of Systems  Gastrointestinal: Positive for abdominal pain.  All other systems reviewed and are negative.   Blood pressure (!) 234/98, pulse 71, temperature 97.9 F (36.6 C), temperature source Skin, height 5\' 9"  (1.753 m), weight 212 lb (96.2 kg), SpO2 99 %.  Physical Exam Physical Exam Constitutional:      General: He is not in acute distress.    Appearance: Normal appearance. He is obese. He is not toxic-appearing.  HENT:     Head: Normocephalic and atraumatic.     Mouth/Throat:     Mouth: Mucous membranes are moist.  Eyes:     General: No scleral icterus.    Conjunctiva/sclera: Conjunctivae normal.     Comments: Bell's palsy with right lid droop and lower lip paralysis.  Neck:     Musculoskeletal: Normal range of motion.  Cardiovascular:     Rate and Rhythm: Normal rate and  regular rhythm.     Pulses: Normal pulses.  Pulmonary:     Effort: Pulmonary effort is normal.     Breath sounds: Normal breath sounds.  Abdominal:     General: Abdomen is flat.     Tenderness: There is guarding.     Comments: Obese. Voluntary guarding with examination of RUQ. No Murphy's sign.  Genitourinary:    Comments: Deferred. Musculoskeletal: Normal range of motion.        General: No swelling.  Lymphadenopathy:     Cervical: No cervical adenopathy.  Skin:    General: Skin is warm and dry.     Coloration: Skin is not jaundiced.  Neurological:     Mental Status: He is alert.     Sensory: Sensory deficit present.     Comments: Right side of face, secondary to Bell's palsy.  Psychiatric:        Mood and Affect: Mood normal.        Behavior: Behavior normal.     Data Reviewed ED visit of 05/09/18 and RUQ US performed that date.    CLINICAL DATA:  Right upper quadrant pain  EXAM: ULTRASOUND ABDOMEN LIMITED RIGHT UPPER QUADRANT  COMPARISON:  September 11, 2009  FINDINGS: Gallbladder:  Within the gallbladder, there is a 4 mm echogenic focus which moves and shadows consistent with a small gallstone. There are two 3 mm echogenic foci which do not move and shadow, consistent with small polyps. There is no gallbladder wall thickening or pericholecystic fluid. No sonographic Murphy sign noted by sonographer.  Common bile duct:  Diameter: 2 mm. No intrahepatic or extrahepatic biliary duct dilatation.  Liver:  No focal lesion identified. Within normal limits in parenchymal echogenicity. Portal vein is patent on color Doppler imaging with normal direction of blood flow towards the liver.  IMPRESSION: There is a 4 mm gallstone. No gallbladder wall thickening or pericholecystic fluid.  There are two 3 mm polyps within the gallbladder. Per consensus guidelines, polyps of this small size do not warrant additional imaging surveillance.  Study otherwise  unremarkable.  Results for TRAETON, BORDAS (MRN [id number]) as of 05/14/2018 17:15  Ref. Range 05/09/2018 08:41  Sodium Latest Ref Range: 135 - 145 mmol/L 139  Potassium Latest Ref Range: 3.5 - 5.1 mmol/L 3.2 (L)  Chloride Latest Ref Range: 98 - 111 mmol/L 103  CO2 Latest Ref Range: 22 - 32 mmol/L 31  Glucose Latest Ref Range: 70 - 99 mg/dL 101 (H)  BUN Latest Ref Range: 8 - 23  mg/dL 13  Creatinine Latest Ref Range: 0.61 - 1.24 mg/dL 0.88  Calcium Latest Ref Range: 8.9 - 10.3 mg/dL 9.4  Anion gap Latest Ref Range: 5 - 15  5  Alkaline Phosphatase Latest Ref Range: 38 - 126 U/L 69  Albumin Latest Ref Range: 3.5 - 5.0 g/dL 4.3  Lipase Latest Ref Range: 11 - 51 U/L 35  AST Latest Ref Range: 15 - 41 U/L 20  ALT Latest Ref Range: 0 - 44 U/L 18  Total Protein Latest Ref Range: 6.5 - 8.1 g/dL 8.0  Bilirubin, Direct Latest Ref Range: 0.0 - 0.2 mg/dL <0.1  Indirect Bilirubin Latest Ref Range: 0.3 - 0.9 mg/dL NOT CALCULATED  Total Bilirubin Latest Ref Range: 0.3 - 1.2 mg/dL 0.9  GFR, Est Non African American Latest Ref Range: >60 mL/min >60  GFR, Est African American Latest Ref Range: >60 mL/min >60  Troponin I Latest Ref Range: <0.03 ng/mL 0.03 (HH)  WBC Latest Ref Range: 4.0 - 10.5 K/uL 5.1  RBC Latest Ref Range: 4.22 - 5.81 MIL/uL 4.79  Hemoglobin Latest Ref Range: 13.0 - 17.0 g/dL 14.7  HCT Latest Ref Range: 39.0 - 52.0 % 43.8  MCV Latest Ref Range: 80.0 - 100.0 fL 91.4  MCH Latest Ref Range: 26.0 - 34.0 pg 30.7  MCHC Latest Ref Range: 30.0 - 36.0 g/dL 33.6  RDW Latest Ref Range: 11.5 - 15.5 % 13.2  Platelets Latest Ref Range: 150 - 400 K/uL 206  nRBC Latest Ref Range: 0.0 - 0.2 % 0.0   Imaging and labs negative for acute cholecystitis.  Assessment 64 year old male with symptoms consistent with biliary colic.  He does have a small stone which may be wedged in the cystic duct.  I recommended that he undergo cholecystectomy due to his ongoing pain.  He also has uncontrolled  hypertension.  I recommended that he seek care in the emergency department due to his extreme blood pressure elevation.  He refused and stated that he would see his primary care provider this week.  Plan We will schedule him for a laparoscopic cholecystectomy.  He needs to get his blood pressure under better control.  This may be as simple as actually taking his prescribed medications, but he will need to see his primary care provider.  I will also have him seen by anesthesiology in the preop clinic to determine whether or not we may proceed safely with his operation.I discussed the procedure in detail.  We discussed the risks and benefits of a laparoscopic cholecystectomy and possible cholangiogram including, but not limited to: bleeding, infection, injury to surrounding structures such as the intestine or liver, bile leak, retained gallstones, need to convert to an open procedure, prolonged diarrhea, blood clots such as DVT, common bile duct injury, anesthesia risks, and possible need for additional procedures. The patient had the opportunity to ask any questions and these were answered to his satisfaction.     Fredirick Maudlin 05/14/2018, 5:06 PM

## 2018-05-14 NOTE — Patient Instructions (Signed)

## 2018-05-16 ENCOUNTER — Telehealth: Payer: Self-pay | Admitting: *Deleted

## 2018-05-16 ENCOUNTER — Telehealth: Payer: Self-pay | Admitting: General Surgery

## 2018-05-16 NOTE — Telephone Encounter (Signed)
Patient's wife, Helene Kelp, contacted today and surgery date for 06-01-18 has been confirmed.    The patient's wife is aware he will need to Pre-Admit on 05-21-18 at 11 am. Patient will check in at the [facility], [address] (first floor).   The patient is scheduled to see his PCP at [facility] on 05-18-18 for medical clearance due to elevated blood pressure.

## 2018-05-16 NOTE — Telephone Encounter (Signed)
I have mailed a physician estimate for patient's surgery that is scheduled on 06/01/18 with Dr Celine Ahr to the address in patients chart.   Laparoscopic cholecystectomy Self-pay

## 2018-05-21 ENCOUNTER — Other Ambulatory Visit: Payer: Self-pay

## 2018-05-21 ENCOUNTER — Encounter: Payer: Self-pay | Admitting: Anesthesiology

## 2018-05-21 ENCOUNTER — Encounter
Admission: RE | Admit: 2018-05-21 | Discharge: 2018-05-21 | Disposition: A | Discharge status: Home / Self Care | Payer: Self-pay | Source: Ambulatory Visit | Attending: General Surgery | Admitting: General Surgery

## 2018-05-21 DIAGNOSIS — Z01812 Encounter for preprocedural laboratory examination: Secondary | ICD-10-CM | POA: Insufficient documentation

## 2018-05-21 HISTORY — DX: Unspecified hemorrhoids: K64.9

## 2018-05-21 HISTORY — DX: Calculus of bile duct without cholangitis or cholecystitis without obstruction: K80.50

## 2018-05-21 HISTORY — DX: Pure hypercholesterolemia, unspecified: E78.00

## 2018-05-21 NOTE — Pre-Procedure Instructions (Signed)
EKG/REQUEST TO CLEAR RELATED TO BP CALLED AND FAXED TO [facility] WHO IS SEEING PATIENT 05/25/18

## 2018-05-21 NOTE — Patient Instructions (Signed)
Your procedure is scheduled on: Friday, March 13 Report to Day Surgery on the 2nd floor of the [facility]. To find out your arrival time, please call [phone] between 1PM - 3PM on: Thursday, March 12  REMEMBER: Instructions that are not followed completely may result in serious medical risk, up to and including death; or upon the discretion of your surgeon and anesthesiologist your surgery may need to be rescheduled.  Do not eat food after midnight the night before surgery.  No gum chewing, lozengers or hard candies.  You may however, drink CLEAR liquids up to 2 hours before you are scheduled to arrive for your surgery. Do not drink anything within 2 hours of the start of your surgery.  Clear liquids include: - water  - apple juice without pulp - gatorade - black coffee or tea (Do NOT add milk or creamers to the coffee or tea) Do NOT drink anything that is not on this list.  No Alcohol for 24 hours before or after surgery.  No Smoking including e-cigarettes for 24 hours prior to surgery.  No chewable tobacco products for at least 6 hours prior to surgery.  No nicotine patches on the day of surgery.  On the morning of surgery brush your teeth with toothpaste and water, you may rinse your mouth with mouthwash if you wish. Do not swallow any toothpaste or mouthwash.  Notify your doctor if there is any change in your medical condition (cold, fever, infection).  Do not wear jewelry, make-up, hairpins, clips or nail polish.  Do not wear lotions, powders, or perfumes.   Do not shave 48 hours prior to surgery.   Contacts and dentures may not be worn into surgery.  Do not bring valuables to the hospital, including drivers license, insurance or credit cards.  [facility] is not responsible for any belongings or valuables.   TAKE THESE MEDICATIONS THE MORNING OF SURGERY:  1.  Famotidine - (take one the night before and one on the morning of surgery - helps to prevent nausea  after surgery.) 2.  Hydralazine  Use CHG Soap as directed on instruction sheet.  Starting March 6 - Stop aspirin and Anti-inflammatories (NSAIDS) such as Advil, Aleve, Ibuprofen, Motrin, Naproxen, Naprosyn and Aspirin based products such as Excedrin, Goodys Powder, BC Powder. (May take Tylenol or Acetaminophen if needed.)  Starting March 6 - Stop ANY OVER THE COUNTER supplements until after surgery.  Wear comfortable clothing (specific to your surgery type) to the hospital.  If you are being discharged the day of surgery, you will not be allowed to drive home. You will need a responsible adult to drive you home and stay with you that night.   If you are taking public transportation, you will need to have a responsible adult with you. Please confirm with your physician that it is acceptable to use public transportation.   Please call [phone] if you have any questions about these instructions.

## 2018-05-22 ENCOUNTER — Telehealth: Payer: Self-pay | Admitting: Cardiovascular Disease

## 2018-05-22 NOTE — Telephone Encounter (Signed)
Pt has been referred to Dr. Rockey Situ for preop evaluation. Appt is on 06/05/2018.  His surgery is scheduled for 06/01/18.   Pre-op callback, please notify [facility] that surgery may have to be rescheduled until after seen for clearance.   _________________________________________________  Request for surgical clearance: 1. What type of surgery is being performed? LAP CHOLECYSTECTOMY  2. When is this surgery scheduled? 06/01/18 3. What type of clearance is required (medical clearance vs. Pharmacy clearance to hold med vs. Both)? MEDICAL Are there any medications that need to be held prior to surgery and how long? NOT LISTED 4. Practice name and name of physician performing surgery? [facility], DR. Anderson [place] 5. What is your office phone number [phone] 7.   What is your office fax number [phone] 8.   Anesthesia type (None, local, MAC, general) ? NOT LISTED

## 2018-05-22 NOTE — Telephone Encounter (Signed)
   [facility] Pre-operative Risk Assessment    Request for surgical clearance:  1. What type of surgery is being performed? LAP CHOLECYSTECTOMY  2. When is this surgery scheduled? 06/01/18  3. What type of clearance is required (medical clearance vs. Pharmacy clearance to hold med vs. Both)? MEDICAL, ELEVATED BP, PLEASE REVIEW EKG 05/09/18  4. Are there any medications that need to be held prior to surgery and how long? NOT LISTED   5. Practice name and name of physician performing surgery? [facility], [place], DR PENWARDEN  6. What is your office phone number   [phone] 7.   What is your office fax number [phone]  8.   Anesthesia type (None, local, MAC, general) ? NOT LISTED  Lucienne Minks 05/22/2018, 2:42 PM  _________________________________________________________________   (provider comments below)

## 2018-05-22 NOTE — Telephone Encounter (Signed)
Per pcp office schedule asap for new patient appt.  Scheduled 3/17 with Arida at 3

## 2018-05-22 NOTE — Telephone Encounter (Signed)
   [facility] Pre-operative Risk Assessment    Request for surgical clearance:  1. What type of surgery is being performed? LAP CHOLECYSTECTOMY   2. When is this surgery scheduled? 06/01/18  3. What type of clearance is required (medical clearance vs. Pharmacy clearance to hold med vs. Both)? MEDICAL  Are there any medications that need to be held prior to surgery and how long? NOT LISTED  4. Practice name and name of physician performing surgery? [facility], DR. Anderson Malta [place]  5. What is your office phone number [phone]   7.   What is your office fax number [phone]  8.   Anesthesia type (None, local, MAC, general) ? NOT LISTED   Lucienne Minks 05/22/2018, 7:47 AM  _________________________________________________________________   (provider comments below)

## 2018-05-22 NOTE — Telephone Encounter (Signed)
    This patient has not been seen in our office before. He has been seen by [facility] in 2017. He was also seen by Dr. Neoma Laming in the hospital for cardiac evaluation in 02/2016.  I called the patient to inquire as to whether he is followed by a cardiologist and he states that the only doctors he sees are at the [facility] in [place]. He says that a doctor wanted him seen by our practice for his elevated BP.   The patient should probably be seen by Dr. Humphrey Rolls as he has established a relationship.   If pt needs to be seen for elevated BP and surgical clearance in our practice  he will need a referral as a new patient.   Daune Perch, AGNP-C [place] 05/22/2018  11:44 AM Pager: [phone]

## 2018-05-22 NOTE — Telephone Encounter (Signed)
I will efax this to the requesting provider and his PCP at [facility].

## 2018-05-23 ENCOUNTER — Telehealth: Payer: Self-pay | Admitting: *Deleted

## 2018-05-23 NOTE — Telephone Encounter (Signed)
Faxed requesting [facility] preop clearance below to make them aware of pts appt and that surgery may need to be postponed until after his appt.

## 2018-05-23 NOTE — Pre-Procedure Instructions (Signed)
CALLED DR Glenford Peers RE PT REFERRED BY PCP TO DR Rockey Situ [facility] IS SEEING 06/05/18. SO NOT CLEARED FOR SURGERY 06/01/18. SPOKE [place]

## 2018-05-23 NOTE — Telephone Encounter (Signed)
Patient contacted today and notified that since his primary care doctor is requesting an evaluation with cardiology that Dr. Celine Ahr would like to cancel surgery until we receive cardiac clearance.   The patient is scheduled for follow up with his PCP on 05-25-18.   Appointment with Dr. Fletcher Anon scheduled for 06-05-18.

## 2018-06-01 ENCOUNTER — Ambulatory Visit: Admit: 2018-06-01 | Payer: Self-pay | Source: Ambulatory Visit | Admitting: General Surgery

## 2018-06-01 ENCOUNTER — Encounter: Payer: Self-pay | Source: Ambulatory Visit

## 2018-06-01 SURGERY — LAPAROSCOPIC CHOLECYSTECTOMY
Anesthesia: General

## 2018-06-05 ENCOUNTER — Ambulatory Visit: Payer: Self-pay | Admitting: Cardiovascular Disease

## 2018-06-05 ENCOUNTER — Encounter: Payer: Self-pay | Admitting: *Deleted

## 2018-06-05 ENCOUNTER — Encounter

## 2018-06-05 NOTE — Progress Notes (Signed)
Patient rescheduled appointment with cardiology due to COVID-19.   The patient is now scheduled to see Marguerite Olea, MD on 07-11-18.

## 2018-06-05 NOTE — Progress Notes (Signed)
Per Caryl-Lyn, [facility] would not clear patient at 05-25-18 office visit and patient would need to be cleared by cardiology.   Patient is scheduled to see Dr. Fletcher Anon on 06-05-18.

## 2018-06-27 ENCOUNTER — Telehealth: Payer: Self-pay | Admitting: *Deleted

## 2018-06-27 NOTE — Telephone Encounter (Signed)
Patient has New Patient appointment for 07/11/18. Called to discuss. Patient says it was for clearance regarding elevated blood pressure for a lap chole. Patient went on to say that his PCP started him on Pepcid and for about 1 month now he has not had gallbladder pain or issues anymore. He is afraid of going out in public right now because of the Covid virus and would like to wait to reschedule his appointment at a later time.  HTN is currently managed by his PCP. Patient aware it would be good for him to call back to reschedule when needed.   Will also route to the COVID pool.

## 2018-07-11 ENCOUNTER — Ambulatory Visit: Payer: Self-pay | Admitting: Cardiology

## 2018-07-12 ENCOUNTER — Telehealth: Payer: Self-pay | Admitting: *Deleted

## 2018-07-12 NOTE — Telephone Encounter (Signed)
Called the patient since he did not go to his cardiology appointment that was scheduled for 07-11-18 with [facility].   The patient reports he does not want to go out in public due to [id number].   Patient does report that he is not having any gallbladder issues at this time.   The patient wishes to call the office if he wishes to arrange surgery in the future. He is aware that we would still need cardiac clearance prior. He verbalizes understanding.

## 2019-07-01 ENCOUNTER — Other Ambulatory Visit: Payer: Self-pay

## 2019-07-01 ENCOUNTER — Emergency Department
Admission: EM | Admit: 2019-07-01 | Discharge: 2019-07-01 | Disposition: A | Discharge status: Home / Self Care | Payer: Self-pay | Attending: Emergency Medicine | Admitting: Emergency Medicine

## 2019-07-01 ENCOUNTER — Encounter: Payer: Self-pay | Admitting: Emergency Medicine

## 2019-07-01 ENCOUNTER — Emergency Department: Payer: Self-pay

## 2019-07-01 DIAGNOSIS — Y929 Unspecified place or not applicable: Secondary | ICD-10-CM | POA: Insufficient documentation

## 2019-07-01 DIAGNOSIS — Y9389 Activity, other specified: Secondary | ICD-10-CM | POA: Insufficient documentation

## 2019-07-01 DIAGNOSIS — Y999 Unspecified external cause status: Secondary | ICD-10-CM | POA: Insufficient documentation

## 2019-07-01 DIAGNOSIS — Z9114 Patient's other noncompliance with medication regimen: Secondary | ICD-10-CM | POA: Insufficient documentation

## 2019-07-01 DIAGNOSIS — I10 Essential (primary) hypertension: Secondary | ICD-10-CM | POA: Insufficient documentation

## 2019-07-01 DIAGNOSIS — F17210 Nicotine dependence, cigarettes, uncomplicated: Secondary | ICD-10-CM | POA: Insufficient documentation

## 2019-07-01 DIAGNOSIS — Z79899 Other long term (current) drug therapy: Secondary | ICD-10-CM | POA: Insufficient documentation

## 2019-07-01 DIAGNOSIS — W1789XA Other fall from one level to another, initial encounter: Secondary | ICD-10-CM | POA: Insufficient documentation

## 2019-07-01 DIAGNOSIS — S20212A Contusion of left front wall of thorax, initial encounter: Secondary | ICD-10-CM | POA: Insufficient documentation

## 2019-07-01 DIAGNOSIS — Z7982 Long term (current) use of aspirin: Secondary | ICD-10-CM | POA: Insufficient documentation

## 2019-07-01 MED ORDER — TRAMADOL HCL 50 MG PO TABS: 50.0000 mg | ORAL_TABLET | Freq: Four times a day (QID) | ORAL | 0 refills | Status: DC | PRN

## 2019-07-01 MED ORDER — TRAMADOL HCL 50 MG PO TABS: 50.0000 mg | ORAL_TABLET | Freq: Four times a day (QID) | ORAL | 0 refills | Status: AC | PRN

## 2019-07-01 MED ORDER — HYDROMORPHONE HCL 1 MG/ML IJ SOLN
1.0000 mg | Freq: Once | INTRAMUSCULAR | Status: AC
  Administered 2019-07-01: 09:00:00 1 mg via INTRAMUSCULAR
  Filled 2019-07-01: qty 1

## 2019-07-01 MED ORDER — CLONIDINE HCL 0.1 MG PO TABS
0.3000 mg | ORAL_TABLET | Freq: Once | ORAL | Status: AC
  Administered 2019-07-01: 0.3 mg via ORAL
  Filled 2019-07-01: qty 3

## 2019-07-01 MED ORDER — LIDOCAINE 5 % EX PTCH: 1.0000 | MEDICATED_PATCH | Freq: Two times a day (BID) | CUTANEOUS | 0 refills | Status: DC

## 2019-07-01 MED ORDER — LIDOCAINE 5 % EX PTCH: 1.0000 | MEDICATED_PATCH | Freq: Two times a day (BID) | CUTANEOUS | 0 refills | Status: AC

## 2019-07-01 NOTE — Discharge Instructions (Signed)
Advised to follow-up with your treating doctor for hypertension since you refused to take medication as prescribed in the past.  Your x-ray was negative for rib fracture.

## 2019-07-01 NOTE — ED Triage Notes (Signed)
Presents via EMS  States he fell yesterday  Landed on left side  Having pain to left posterior rib area  Increased pain with inspiration

## 2019-07-01 NOTE — ED Provider Notes (Signed)
[facility] Emergency Department Provider Note   ____________________________________________   First MD Initiated Contact with Patient 07/01/19 [phone]     (approximate)  I have reviewed the triage vital signs and the nursing notes.   HISTORY  Chief Complaint Fall    HPI STRAN BECTON is a 64 y.o. male patient presents with left lateral/posterior rib pain secondary to a fall yesterday.  Patient state he fell of a low level crane and landed on his left arm.  Patient states rib pain increased with inspiration.  It was noted that the patient arrived with a blood pressure 230/140.  Patient voices no concerns states this secondary to his noncompliance of medication.  Patient state medication causes headaches and dizziness so he decided not to take him.  Patient has been noncompliant for over 3 months.  This is also verified by his wife who states he tries to get him to take the medication without success.  Patient does not notify his treating doctor about cessation of medication.  Patient said that he does not want any blood pressure medication but advised him I cannot let him leave and be conscious of blood pressure high.  Patient state blood pressure is high secondary to pain.  Patient rates his pain as a 10/10.  Patient described pain as "sharp/aching.  No palliative measure for pain.  Patient also relates a history of a CVA in 2013.         Past Medical History:  Diagnosis Date  . Bell's palsy   . Biliary colic   . Hemorrhoids   . Hypercholesterolemia   . Hypertension   . Stroke (cerebrum) ([place])   . Tobacco use     Patient Active Problem List   Diagnosis Date Noted  . Chest pain 03/19/2016    Past Surgical History:  Procedure Laterality Date  . VARICOSE VEIN SURGERY Left     Prior to Admission medications   Medication Sig Start Date End Date Taking? Authorizing Provider  amLODipine (NORVASC) 10 MG tablet Take 10 mg by mouth every evening.     [provider]  aspirin EC 81 MG tablet Take 81 mg by mouth daily. 08/04/14   [provider]  atorvastatin (LIPITOR) 20 MG tablet Take 20 mg by mouth at bedtime.    [provider]  famotidine (PEPCID) 20 MG tablet Take 1 tablet (20 mg total) by mouth 2 (two) times daily. 05/09/18   Carrie Mew, MD  gabapentin (NEURONTIN) 100 MG capsule Take 100 mg by mouth at bedtime.     [provider]  hydrALAZINE (APRESOLINE) 25 MG tablet Take 25 mg by mouth 2 (two) times daily.    [provider]  hydrochlorothiazide (HYDRODIURIL) 25 MG tablet Take 25 mg by mouth every evening.    [provider]  lidocaine (LIDODERM) 5 % Place 1 patch onto the skin every 12 (twelve) hours. Remove & Discard patch within 12 hours or as directed by MD 07/01/19 06/30/20  Sable Feil, PA-C  lisinopril (PRINIVIL,ZESTRIL) 20 MG tablet Take 20 mg by mouth every evening.    [provider]  naproxen (NAPROSYN) 500 MG tablet Take 1 tablet (500 mg total) by mouth 2 (two) times daily with a meal. 05/09/18   Carrie Mew, MD  nitroGLYCERIN (NITROSTAT) 0.4 MG SL tablet Place 0.4 mg under the tongue every 5 (five) minutes x 3 doses as needed. 08/04/14   [provider]  traMADol (ULTRAM) 50 MG tablet Take  1 tablet (50 mg total) by mouth every 6 (six) hours as needed. 07/01/19 06/30/20  Sable Feil, PA-C    Allergies Patient has no known allergies.  Family History  Problem Relation Age of Onset  . Alzheimer's disease Father   . Heart disease Father   . CAD Neg Hx     Social History Social History   Tobacco Use  . Smoking status: Current Every Day Smoker    Packs/day: 1.00    Types: Cigarettes  . Smokeless tobacco: Never Used  Substance Use Topics  . Alcohol use: No  . Drug use: No    Review of Systems Constitutional: No fever/chills Eyes: No visual changes. ENT: No sore throat. Cardiovascular: Denies chest pain. Respiratory: Denies  shortness of breath. Gastrointestinal: No abdominal pain.  No nausea, no vomiting.  No diarrhea.  No constipation. Genitourinary: Negative for dysuria. Musculoskeletal: Left lateral/posterior rib pain. Skin: Negative for rash. Neurological: Negative for headaches, focal weakness or numbness.  Endocrine:  Hyperlipidemia and hypertension. ____________________________________________   PHYSICAL EXAM:  VITAL SIGNS: ED Triage Vitals  Enc Vitals Group     BP 07/01/19 0843 (!) 230/140     Pulse Rate 07/01/19 0843 88     Resp 07/01/19 0843 16     Temp 07/01/19 0843 98 F (36.7 C)     Temp Source 07/01/19 0843 Oral     SpO2 07/01/19 0843 99 %     Weight 07/01/19 0844 220 lb (99.8 kg)     Height 07/01/19 0844 5\' 9"  (1.753 m)     Head Circumference --      Peak Flow --      Pain Score 07/01/19 0843 10     Pain Loc --      Pain Edu? --      Excl. in [place]? --    Constitutional: Alert and oriented. Well appearing and in no acute distress. Eyes: Conjunctivae are normal. PERRL. EOMI. Head: Atraumatic. Nose: No congestion/rhinnorhea. Mouth/Throat: Mucous membranes are moist.  Oropharynx non-erythematous. Neck: No stridor.  No cervical spine tenderness to palpation. Hematological/Lymphatic/Immunilogical: No cervical lymphadenopathy. Cardiovascular: Normal rate, regular rhythm. Grossly normal heart sounds.  Good peripheral circulation. Respiratory: Normal respiratory effort.  No retractions. Lungs CTAB. Gastrointestinal: Soft and nontender. No distention. No abdominal bruits. No CVA tenderness. Musculoskeletal: Moderate guarding palpation left lateral ribs 8 through 10.   Neurologic:  Normal speech and language. No gross focal neurologic deficits are appreciated. No gait instability. Skin:  Skin is warm, dry and intact. No rash noted.  No abrasions or ecchymosis. Psychiatric: Mood and affect are normal. Speech and behavior are normal.  ____________________________________________    LABS (all labs ordered are listed, but only abnormal results are displayed)  Labs Reviewed - No data to display ____________________________________________  EKG   ____________________________________________  RADIOLOGY  ED MD interpretation:    Official radiology report(s): DG Ribs Unilateral W/Chest Left  Result Date: 07/01/2019 CLINICAL DATA:  Fall, left rib pain EXAM: LEFT RIBS AND CHEST - 3+ VIEW COMPARISON:  Chest radiograph 05/09/2018 FINDINGS: There is no acute left rib fracture identified. No pleural effusion or pneumothorax. No consolidation. Normal heart size for portable technique. IMPRESSION: No acute left rib fracture. Electronically Signed   By: Macy Mis M.D.   On: 07/01/2019 09:21    ____________________________________________   PROCEDURES  Procedure(s) performed (including Critical Care):  Procedures   ____________________________________________   INITIAL IMPRESSION / ASSESSMENT AND PLAN / ED COURSE  As part of my medical  decision making, I reviewed the following data within the [place]     Patient presents with left lateral rib pain secondary to contusion.  Discussed negative chest x-ray findings with patient.  Patient also has hypertension but is noncompliant with medication.  Initial blood pressure was 230/140.  Status post Catapres BP decreased to 201/97.  Patient denies any residual effects of taking the medication.  Patient also was given Dilaudid IM.  Patient given discharge care instruction and a prescription for for Toradol and Lidoderm patches.  Advised to follow-up with his treating doctor to discuss his hypertension.    TEOFILO PAILLE was evaluated in Emergency Department on 07/01/2019 for the symptoms described in the history of present illness. He was evaluated in the context of the global COVID-19 pandemic, which necessitated consideration that the patient might be at risk for infection with the SARS-CoV-2 virus  that causes COVID-19. Institutional protocols and algorithms that pertain to the evaluation of patients at risk for COVID-19 are in a state of rapid change based on information released by regulatory bodies including the CDC and federal and state organizations. These policies and algorithms were followed during the patient's care in the ED.       ____________________________________________   FINAL CLINICAL IMPRESSION(S) / ED DIAGNOSES  Final diagnoses:  Contusion of rib on left side, initial encounter  Essential hypertension     ED Discharge Orders         Ordered    traMADol (ULTRAM) 50 MG tablet  Every 6 hours PRN     07/01/19 1039    lidocaine (LIDODERM) 5 %  Every 12 hours     07/01/19 1039           Note:  This document was prepared using Dragon voice recognition software and may include unintentional dictation errors.    Sable Feil, PA-C 07/01/19 [place], Kevin, MD 07/01/19 1350

## 2020-10-01 ENCOUNTER — Observation Stay
Admission: EM | Admit: 2020-10-01 | Discharge: 2020-10-02 | Disposition: A | Discharge status: Home / Self Care | Payer: Self-pay | Attending: Internal Medicine | Admitting: Internal Medicine

## 2020-10-01 ENCOUNTER — Emergency Department: Payer: Self-pay

## 2020-10-01 ENCOUNTER — Observation Stay: Payer: Self-pay

## 2020-10-01 ENCOUNTER — Ambulatory Visit: Payer: Self-pay

## 2020-10-01 ENCOUNTER — Other Ambulatory Visit: Payer: Self-pay

## 2020-10-01 DIAGNOSIS — F17210 Nicotine dependence, cigarettes, uncomplicated: Secondary | ICD-10-CM | POA: Insufficient documentation

## 2020-10-01 DIAGNOSIS — Z8673 Personal history of transient ischemic attack (TIA), and cerebral infarction without residual deficits: Secondary | ICD-10-CM | POA: Insufficient documentation

## 2020-10-01 DIAGNOSIS — Z20822 Contact with and (suspected) exposure to covid-19: Secondary | ICD-10-CM | POA: Insufficient documentation

## 2020-10-01 DIAGNOSIS — R079 Chest pain, unspecified: Principal | ICD-10-CM | POA: Diagnosis present

## 2020-10-01 DIAGNOSIS — E876 Hypokalemia: Secondary | ICD-10-CM | POA: Diagnosis present

## 2020-10-01 DIAGNOSIS — R2231 Localized swelling, mass and lump, right upper limb: Secondary | ICD-10-CM

## 2020-10-01 DIAGNOSIS — I10 Essential (primary) hypertension: Secondary | ICD-10-CM

## 2020-10-01 DIAGNOSIS — Z79899 Other long term (current) drug therapy: Secondary | ICD-10-CM | POA: Insufficient documentation

## 2020-10-01 DIAGNOSIS — G510 Bell's palsy: Secondary | ICD-10-CM | POA: Insufficient documentation

## 2020-10-01 DIAGNOSIS — Z7982 Long term (current) use of aspirin: Secondary | ICD-10-CM | POA: Insufficient documentation

## 2020-10-01 LAB — TROPONIN I (HIGH SENSITIVITY)
Troponin I (High Sensitivity): 66 ng/L — ABNORMAL HIGH (ref <18)
Troponin I (High Sensitivity): 75 ng/L — ABNORMAL HIGH (ref <18)

## 2020-10-01 LAB — CBC
HCT: 42 % (ref 39.0–52.0)
Hemoglobin: 14.7 g/dL (ref 13.0–17.0)
MCH: 31.8 pg (ref 26.0–34.0)
MCHC: 35 g/dL (ref 30.0–36.0)
MCV: 90.9 fL (ref 80.0–100.0)
Platelets: 209 10*3/uL (ref 150–400)
RBC: 4.62 MIL/uL (ref 4.22–5.81)
RDW: 13.5 % (ref 11.5–15.5)
WBC: 6.2 10*3/uL (ref 4.0–10.5)
nRBC: 0 % (ref 0.0–0.2)

## 2020-10-01 LAB — HEPATIC FUNCTION PANEL
ALT: 14 U/L (ref 0–44)
AST: 22 U/L (ref 15–41)
Albumin: 3.7 g/dL (ref 3.5–5.0)
Alkaline Phosphatase: 61 U/L (ref 38–126)
Bilirubin, Direct: <0.1 mg/dL (ref 0.0–0.2)
Total Bilirubin: 0.9 mg/dL (ref 0.3–1.2)
Total Protein: 6.9 g/dL (ref 6.5–8.1)

## 2020-10-01 LAB — RESP PANEL BY RT-PCR (FLU A&B, COVID) ARPGX2
Influenza A by PCR: NEGATIVE
Influenza B by PCR: NEGATIVE
SARS Coronavirus 2 by RT PCR: NEGATIVE

## 2020-10-01 LAB — BASIC METABOLIC PANEL
Anion gap: 9 (ref 5–15)
BUN: 14 mg/dL (ref 8–23)
CO2: 27 mmol/L (ref 22–32)
Calcium: 9 mg/dL (ref 8.9–10.3)
Chloride: 103 mmol/L (ref 98–111)
Creatinine, Ser: 1.13 mg/dL (ref 0.61–1.24)
GFR, Estimated: >60 mL/min (ref >60)
Glucose, Bld: 87 mg/dL (ref 70–99)
Potassium: 2.7 mmol/L — CL (ref 3.5–5.1)
Sodium: 139 mmol/L (ref 135–145)

## 2020-10-01 LAB — HEPARIN LEVEL (UNFRACTIONATED): Heparin Unfractionated: <0.1 IU/mL — ABNORMAL LOW (ref 0.30–0.70)

## 2020-10-01 LAB — APTT: aPTT: 32 seconds (ref 24–36)

## 2020-10-01 LAB — PROTIME-INR
INR: 1 (ref 0.8–1.2)
Prothrombin Time: 13.5 seconds (ref 11.4–15.2)

## 2020-10-01 LAB — POTASSIUM: Potassium: 3.3 mmol/L — ABNORMAL LOW (ref 3.5–5.1)

## 2020-10-01 LAB — MAGNESIUM: Magnesium: 2.2 mg/dL (ref 1.7–2.4)

## 2020-10-01 MED ORDER — ONDANSETRON HCL 4 MG/2ML IJ SOLN: 4.0000 mg | Freq: Four times a day (QID) | INTRAMUSCULAR | Status: DC | PRN

## 2020-10-01 MED ORDER — HEPARIN BOLUS VIA INFUSION
2700.0000 [IU] | Freq: Once | INTRAVENOUS | Status: AC
  Administered 2020-10-01: 2700 [IU] via INTRAVENOUS
  Filled 2020-10-01: qty 2700

## 2020-10-01 MED ORDER — ACETAMINOPHEN 500 MG PO TABS: 1000.0000 mg | ORAL_TABLET | Freq: Four times a day (QID) | ORAL | Status: DC | PRN

## 2020-10-01 MED ORDER — ASPIRIN EC 325 MG PO TBEC
325.0000 mg | DELAYED_RELEASE_TABLET | Freq: Every day | ORAL | Status: DC
  Administered 2020-10-01 – 2020-10-02 (×2): 325 mg via ORAL
  Filled 2020-10-01 (×2): qty 1

## 2020-10-01 MED ORDER — POTASSIUM CHLORIDE 20 MEQ PO PACK
40.0000 meq | PACK | Freq: Once | ORAL | Status: AC
  Administered 2020-10-01: 40 meq via ORAL
  Filled 2020-10-01: qty 2

## 2020-10-01 MED ORDER — POTASSIUM CHLORIDE CRYS ER 20 MEQ PO TBCR
40.0000 meq | EXTENDED_RELEASE_TABLET | Freq: Once | ORAL | Status: AC
  Administered 2020-10-02: 40 meq via ORAL
  Filled 2020-10-01: qty 2

## 2020-10-01 MED ORDER — AMLODIPINE BESYLATE 10 MG PO TABS
10.0000 mg | ORAL_TABLET | Freq: Every day | ORAL | Status: DC
  Administered 2020-10-02: 10 mg via ORAL
  Filled 2020-10-01: qty 1

## 2020-10-01 MED ORDER — HYDRALAZINE HCL 50 MG PO TABS: 25.0000 mg | ORAL_TABLET | Freq: Two times a day (BID) | ORAL | Status: DC

## 2020-10-01 MED ORDER — ACETAMINOPHEN 650 MG RE SUPP: 650.0000 mg | Freq: Four times a day (QID) | RECTAL | Status: DC | PRN

## 2020-10-01 MED ORDER — HYDRALAZINE HCL 25 MG PO TABS
25.0000 mg | ORAL_TABLET | Freq: Two times a day (BID) | ORAL | Status: DC
  Administered 2020-10-01 (×2): 25 mg via ORAL
  Filled 2020-10-01 (×2): qty 1

## 2020-10-01 MED ORDER — HEPARIN BOLUS VIA INFUSION
4000.0000 [IU] | Freq: Once | INTRAVENOUS | Status: AC
  Administered 2020-10-01: 4000 [IU] via INTRAVENOUS
  Filled 2020-10-01: qty 4000

## 2020-10-01 MED ORDER — LISINOPRIL 20 MG PO TABS
20.0000 mg | ORAL_TABLET | Freq: Every day | ORAL | Status: DC
  Administered 2020-10-01: 20 mg via ORAL
  Filled 2020-10-01: qty 2

## 2020-10-01 MED ORDER — POTASSIUM CHLORIDE 10 MEQ/100ML IV SOLN
10.0000 meq | INTRAVENOUS | Status: AC
  Administered 2020-10-01 (×3): 10 meq via INTRAVENOUS
  Filled 2020-10-01 (×3): qty 100

## 2020-10-01 MED ORDER — AMLODIPINE BESYLATE 5 MG PO TABS: 10.0000 mg | ORAL_TABLET | Freq: Every day | ORAL | Status: DC

## 2020-10-01 MED ORDER — ONDANSETRON HCL 4 MG PO TABS: 4.0000 mg | ORAL_TABLET | Freq: Four times a day (QID) | ORAL | Status: DC | PRN

## 2020-10-01 MED ORDER — IOHEXOL 350 MG/ML SOLN
75.0000 mL | Freq: Once | INTRAVENOUS | Status: AC | PRN
  Administered 2020-10-01: 75 mL via INTRAVENOUS

## 2020-10-01 MED ORDER — HEPARIN (PORCINE) 25000 UT/250ML-% IV SOLN
1700.0000 [IU]/h | INTRAVENOUS | Status: DC
  Administered 2020-10-01: 1150 [IU]/h via INTRAVENOUS
  Administered 2020-10-02: 1450 [IU]/h via INTRAVENOUS
  Filled 2020-10-01 (×2): qty 250

## 2020-10-01 MED ORDER — LABETALOL HCL 5 MG/ML IV SOLN
10.0000 mg | Freq: Once | INTRAVENOUS | Status: AC
  Administered 2020-10-01: 10 mg via INTRAVENOUS
  Filled 2020-10-01: qty 4

## 2020-10-01 NOTE — Progress Notes (Signed)
[facility] for heparin Indication: chest pain/ACS  No Known Allergies  Patient Measurements: Height: 5\' 9"  (175.3 cm) Weight: 99.8 kg (220 lb) IBW/kg (Calculated) : 70.7 Heparin Dosing Weight: 91 kg  Vital Signs: Temp: 97.7 F (36.5 C) (07/14 1200) Temp Source: Oral (07/14 1200) BP: 193/90 (07/14 1457) Pulse Rate: 55 (07/14 1457)  Labs: Recent Labs    10/01/20 1203 10/01/20 1411  HGB 14.7  --   HCT 42.0  --   PLT 209  --   CREATININE 1.13  --   TROPONINIHS 66* 75*    Estimated Creatinine Clearance: 76.9 mL/min (by C-G formula based on SCr of 1.13 mg/dL).   Medical History: Past Medical History:  Diagnosis Date   Bell's palsy    Biliary colic    Hemorrhoids    Hypercholesterolemia    Hypertension    Stroke (cerebrum) ([facility])    Tobacco use     Assessment: 65 year old male presented with chest pain. No anticoagulation PTA noted on medication review. Pharmacy consult for heparin.  Goal of Therapy:  Heparin level 0.3-0.7 units/ml Monitor platelets by anticoagulation protocol: Yes   Plan:  Heparin 4000 unit bolus Heparin drip at 1150 units/hr Check HL at 2200 CBC daily while on heparin drip  Tawnya Crook, PharmD 10/01/2020,3:07 PM

## 2020-10-01 NOTE — Progress Notes (Signed)
ANTICOAGULATION CONSULT NOTE  Pharmacy Consult for heparin Indication: chest pain/ACS  No Known Allergies  Patient Measurements: Height: 5\' 9"  (175.3 cm) Weight: 99.8 kg (220 lb) IBW/kg (Calculated) : 70.7 Heparin Dosing Weight: 91 kg  Vital Signs: Temp: 97.7 F (36.5 C) (07/14 1938) Temp Source: Oral (07/14 1938) BP: 164/77 (07/14 1938) Pulse Rate: 54 (07/14 1938)  Labs: Recent Labs    10/01/20 1200 10/01/20 1203 10/01/20 1411 10/01/20 2158  HGB  --  14.7  --   --   HCT  --  42.0  --   --   PLT  --  209  --   --   APTT 32  --   --   --   LABPROT 13.5  --   --   --   INR 1.0  --   --   --   HEPARINUNFRC  --   --   --  <0.10*  CREATININE  --  1.13  --   --   TROPONINIHS  --  66* 75*  --      Estimated Creatinine Clearance: 76.9 mL/min (by C-G formula based on SCr of 1.13 mg/dL).   Medical History: Past Medical History:  Diagnosis Date   Bell's palsy    Biliary colic    Hemorrhoids    Hypercholesterolemia    Hypertension    Stroke (cerebrum) (H[facility])    Tobacco use     Assessment: 65 year old male presented with chest pain. No anticoagulation PTA noted on medication review. Pharmacy consult for heparin.  Goal of Therapy:  Heparin level 0.3-0.7 units/ml Monitor platelets by anticoagulation protocol: Yes  07/14 2158 HL <0.10   Plan:  Heparin 2700 unit bolus Increase heparin drip to 1450 units/hr Recheck HL in 6 hours after rate change CBC daily while on heparin drip  Renda Rolls, PharmD, [facility] 10/01/2020 11:40 PM

## 2020-10-01 NOTE — Progress Notes (Signed)
Patient unwilling to take off pants so we can do a full skin assessment.  Patient states that there is nothing wrong with my legs except for varicose veins.   2nd nurse was in the room at time of admission.  Assessed patient back and chest.  No visible wounds noted.   Will reassess skin if patient takes his pants off later.

## 2020-10-01 NOTE — Progress Notes (Signed)
Spoke to patient wife.  Gave update.

## 2020-10-01 NOTE — ED Provider Notes (Signed)
[facility] Emergency Department Provider Note   ____________________________________________   Event Date/Time   First MD Initiated Contact with Patient 10/01/20 1158     (approximate)  I have reviewed the triage vital signs and the nursing notes.   HISTORY  Chief Complaint Chest Pain    HPI Miguel Fletcher is a 65 y.o. male with below stated past medical history presents for chest pain  LOCATION: Central substernal chest DURATION: Began this morning approximately 2 hours prior to arrival TIMING: Slightly improved since onset SEVERITY: 5/10 QUALITY: Tightness/fullness CONTEXT: States pain began after breakfast MODIFYING FACTORS: Denies any exacerbating or relieving factors ASSOCIATED SYMPTOMS: Feels "gassy like I need to burp"   Per medical record review patient has history of Bell's palsy with persistent right facial droop          Past Medical History:  Diagnosis Date   Bell's palsy    Biliary colic    Hemorrhoids    Hypercholesterolemia    Hypertension    Stroke (cerebrum) ([place])    Tobacco use     Patient Active Problem List   Diagnosis Date Noted   Chest pain 03/19/2016    Past Surgical History:  Procedure Laterality Date   VARICOSE VEIN SURGERY Left     Prior to Admission medications   Medication Sig Start Date End Date Taking? Authorizing Provider  amLODipine (NORVASC) 10 MG tablet Take 10 mg by mouth every evening.   Yes [provider]  aspirin EC 81 MG tablet Take 81 mg by mouth daily. 08/04/14  Yes [provider]  hydrALAZINE (APRESOLINE) 25 MG tablet Take 25 mg by mouth 2 (two) times daily.   Yes [provider]  lisinopril-hydrochlorothiazide (ZESTORETIC) 20-25 MG tablet Take 1 tablet by mouth daily.   Yes [provider]  famotidine (PEPCID) 20 MG tablet Take 1 tablet (20 mg total) by mouth 2 (two) times daily. Patient not taking: Reported on 10/01/2020 05/09/18   Carrie Mew, MD  naproxen (NAPROSYN) 500 MG tablet Take 1 tablet (500 mg total) by mouth 2 (two) times daily with a meal. Patient not taking: Reported on 10/01/2020 05/09/18   Carrie Mew, MD    Allergies Patient has no known allergies.  Family History  Problem Relation Age of Onset   Alzheimer's disease Father    Heart disease Father    CAD Neg Hx     Social History Social History   Tobacco Use   Smoking status: Every Day    Packs/day: 2.00    Years: 25.00    Pack years: 50.00    Types: Cigarettes   Smokeless tobacco: Never  Vaping Use   Vaping Use: Never used  Substance Use Topics   Alcohol use: No   Drug use: No    Review of Systems Constitutional: No fever/chills Eyes: No visual changes. ENT: No sore throat. Cardiovascular: Endorses chest pain. Respiratory: Denies shortness of breath. Gastrointestinal: No abdominal pain.  No nausea, no vomiting.  No diarrhea. Genitourinary: Negative for dysuria. Musculoskeletal: Negative for acute arthralgias Skin: Negative for rash. Neurological: Negative for headaches, weakness/numbness/paresthesias in any extremity Psychiatric: Negative for suicidal ideation/homicidal ideation   ____________________________________________   PHYSICAL EXAM:  VITAL SIGNS: ED Triage Vitals  Enc Vitals Group     BP 10/01/20 1200 (!) 172/77     Pulse Rate 10/01/20 1200 60     Resp 10/01/20 1200 (!) 30     Temp 10/01/20 1200 97.7 F (36.5 C)  Temp Source 10/01/20 1200 Oral     SpO2 10/01/20 1200 97 %     Weight 10/01/20 1201 220 lb (99.8 kg)     Height 10/01/20 1201 5\' 9"  (1.753 m)     Head Circumference --      Peak Flow --      Pain Score 10/01/20 1201 5     Pain Loc --      Pain Edu? --      Excl. in [place]? --    Constitutional: Alert and oriented. Well appearing and in no acute distress. Eyes: Conjunctivae are normal. PERRL. Head: Atraumatic. Nose: No congestion/rhinnorhea. Mouth/Throat: Mucous membranes are moist. Neck:  No stridor Cardiovascular: Grossly normal heart sounds.  Good peripheral circulation. Respiratory: Normal respiratory effort.  No retractions. Gastrointestinal: Soft and nontender. No distention. Musculoskeletal: Right axillary mass Neurologic:  Normal speech and language. No gross focal neurologic deficits are appreciated. Skin:  Skin is warm and dry. No rash noted. Psychiatric: Mood and affect are normal. Speech and behavior are normal.  ____________________________________________   LABS (all labs ordered are listed, but only abnormal results are displayed)  Labs Reviewed  BASIC METABOLIC PANEL - Abnormal; Notable for the following components:      Result Value   Potassium 2.7 (*)    All other components within normal limits  TROPONIN I (HIGH SENSITIVITY) - Abnormal; Notable for the following components:   Troponin I (High Sensitivity) 66 (*)    All other components within normal limits  TROPONIN I (HIGH SENSITIVITY) - Abnormal; Notable for the following components:   Troponin I (High Sensitivity) 75 (*)    All other components within normal limits  RESP PANEL BY RT-PCR (FLU A&B, COVID) ARPGX2  CBC  HEPATIC FUNCTION PANEL  APTT  PROTIME-INR  HEPARIN LEVEL (UNFRACTIONATED)  HIV ANTIBODY (ROUTINE TESTING W REFLEX)  MAGNESIUM   ____________________________________________  EKG  ED ECG REPORT I, Naaman Plummer, the attending physician, personally viewed and interpreted this ECG.  Date: 10/01/2020 EKG Time: 1157 Rate: 58 Rhythm: normal sinus rhythm QRS Axis: normal Intervals: normal ST/T Wave abnormalities: normal Narrative Interpretation: no evidence of acute ischemia  ____________________________________________  RADIOLOGY  ED MD interpretation: 2 view chest x-ray shows no evidence of acute abnormalities including no pneumonia, pneumothorax, or widened mediastinum  CTA of the chest shows no evidence of acute abnormalities including no fractures, dislocations,  vascular abnormalities, or significant edema  Official radiology report(s): DG Chest 2 View  Result Date: 10/01/2020 CLINICAL DATA:  65 year old male with history of chest pain and facial droop. Right-sided weakness. EXAM: CHEST - 2 VIEW COMPARISON:  Chest x-ray 07/01/2019. FINDINGS: Lung volumes are normal. No consolidative airspace disease. No pleural effusions. No pneumothorax. No pulmonary nodule or mass noted. Pulmonary vasculature and the cardiomediastinal silhouette are within normal limits. IMPRESSION: No radiographic evidence of acute cardiopulmonary disease. Electronically Signed   By: Vinnie Langton M.D.   On: 10/01/2020 12:46   CT ANGIO CHEST AORTA W/CM & OR WO/CM  Result Date: 10/01/2020 CLINICAL DATA:  Chest pain and back pain. Aortic dissection suspected. EXAM: CT ANGIOGRAPHY CHEST WITH CONTRAST TECHNIQUE: Multidetector CT imaging of the chest was performed using the standard protocol during bolus administration of intravenous contrast. Multiplanar CT image reconstructions and MIPs were obtained to evaluate the vascular anatomy. CONTRAST:  20mL OMNIPAQUE IOHEXOL 350 MG/ML SOLN COMPARISON:  Chest radiography same day. FINDINGS: Cardiovascular: Mild cardiomegaly. Coronary artery calcification is present. Aortic atherosclerotic calcification is present. No evidence of dissection or  aneurysmal dilatation. Small amount of fluid in the superior pericardial recess. Pulmonary arterial branches appear normal. Mediastinum/Nodes: No mass or lymphadenopathy. Lungs/Pleura: No pleural effusion. No pulmonary edema. No infiltrate or collapse. No mass or nodule. Upper Abdomen: No upper abdominal finding of significance. Musculoskeletal: Chronic degenerative spondylosis and bridging osteophytes. Review of the MIP images confirms the above findings. IMPRESSION: No acute chest pathology by CT.  No aortic aneurysm or dissection. Aortic Atherosclerosis (ICD10-I70.0). Coronary artery calcification. Electronically  Signed   By: Nelson Chimes M.D.   On: 10/01/2020 14:36    ____________________________________________   PROCEDURES  Procedure(s) performed (including Critical Care):  Procedures   ____________________________________________   INITIAL IMPRESSION / ASSESSMENT AND PLAN / ED COURSE  As part of my medical decision making, I reviewed the following data within the electronic medical record, if available:  Nursing notes reviewed and incorporated, Labs reviewed, EKG interpreted, Old chart reviewed, Radiograph reviewed and Notes from prior ED visits reviewed and incorporated        Workup: ECG, CXR, CBC, BMP, Troponin Findings: ECG: No overt evidence of STEMI. No evidence of Brugadas sign, delta wave, epsilon wave, significantly prolonged QTc, or malignant arrhythmia HS Troponin: Negative x1 Other Labs unremarkable for emergent problems. CXR: Without PTX, PNA, or widened mediastinum Last Stress Test:  2017 Last Heart Catheterization:  2017 HEART Score: 5  Given History, Exam, and Workup I have low suspicion for ACS, Pneumothorax, Pneumonia, Pulmonary Embolus, Tamponade, Aortic Dissection or other emergent problem as a cause for this presentation.   Reassesment: Prior to discharge patients pain was controlled and they were well appearing.  High Risk Chest Pain Patient at increased risk for Major Adverse Cardiac Event (AMI, PCI, CABG, death) Interventions: ASA 324mg  Defer Heparin drip as patient pain free at this time,   Disposition: Admit for continued cardiac monitoring and trending of troponins as well as further evaluation for potential inpatient stress testing vs cardiac catheterization and coronary angiography.      ____________________________________________   FINAL CLINICAL IMPRESSION(S) / ED DIAGNOSES  Final diagnoses:  Chest pain, unspecified type     ED Discharge Orders     None        Note:  This document was prepared using Dragon voice recognition  software and may include unintentional dictation errors.    Naaman Plummer, MD 10/01/20 [phone]

## 2020-10-01 NOTE — ED Triage Notes (Signed)
BIB EMS from home. CP. feeling of belching. EKG normal. IV left AC. Facial droop. Right sided weakness in face but has HX of Bells Palsy.   97% RA 60HR 155/85

## 2020-10-01 NOTE — H&P (Signed)
History and Physical   [name] HAL:[id number] DOB: 02-12-1956 DOA: 10/01/2020  PCP: [facility]  Outpatient Specialists: Dr. Rockey Situ Patient coming from: home  I have personally briefly reviewed patient's old medical records in [place].  Chief Concern: chest pain  HPI: Miguel Fletcher is a 65 y.o. male with medical history significant for hypertension, history of hyperlipidemia, history of Bell's palsy with residual right-sided facial droop, tobacco use, presents to the emergency department for chief concerns of chest pain.  Patient endorses that the chest pain started for about one week, feels like heart burn and he had you want to burp, when it comes on it lasts seconds, burping helps. It worsens with welding and/or activity. The chest discomfort goes away when he relaxes.  Last night he woke up with his right side of this face hurting. He endorses waking up feeling sweating and clammy prompting him to come in.   He reports he has had similar pain in the last few years but it has become more frequent in the last week. He endorse cramping in his right arm and numbness.   Right axillary mass gradual increase in size.  Social history: He lives at home with spouse and 13 dogs. He smokes tobacco, 1 ppd. He denies etoh, recreational drugs. He works as a Building control surveyor.   Vaccination history: He is vaccinated for covid 19, two shots.   Family history: his father was 67/44 when he had his first MI, father had two open heart surgeries  ROS: Constitutional: no weight change, no fever ENT/Mouth: no sore throat, no rhinorrhea Eyes: no eye pain, no vision changes Cardiovascular: + chest pain, + dyspnea,  no edema, no palpitations Respiratory: + cough, no sputum, no wheezing Gastrointestinal: no nausea, no vomiting, no diarrhea, no constipation Genitourinary: no urinary incontinence, no dysuria, no hematuria Musculoskeletal: no arthralgias, no myalgias Skin: no  skin lesions, no pruritus, Neuro: + weakness, no loss of consciousness, no syncope Psych: no anxiety, no depression, + decrease appetite Heme/Lymph: no bruising, no bleeding  ED Course: Discussed with EDP, patient requiring hospitalization for NSTEMI.  Vitals in the emergency department was remarkable for temperature of 97.7, respiration rate of 30, heart rate of 60, blood pressure 172/77, SPO2 of 97% on room air.  Labs in the emergency department was remarkable for high sensitive troponin of 66 and increased to 75.  Sodium 139, potassium 2.7, chloride 103, bicarb 27, BUN 14, serum creatinine of 1.13, nonfasting blood glucose of 87, WBC 6.2, hemoglobin 14.7, platelets 209.  COVID/influenza A/influenza B were negative.  EDP started patient on heparin gtt., potassium chloride 10 mill equivalent x4, potassium phosphate 40 mill equivalent p.o. once.  Assessment/Plan  Principal Problem:   Chest pain Active Problems:   Hypokalemia   Essential hypertension   Axillary mass, right   # Chest pain # NSTEMI - Continue Heparin GTT - Echo ordered - Aspirin 325 mg p.o. once - Cardiology consulted and will see the patient - Cardiology please orders for n.p.o. after midnight  # Hypertension-elevated - Resumed home amlodipine 10 mg daily for 7/15 as patient decreased a.m. dose ready - One-time dose of labetalol 10 mg IV for blood pressure control - Resumed lisinopril 20 mg daily on admission - Holding hydrochlorothiazide due to hypokalemia  # Hypokalemia-holding hydrochlorothiazide - Status post replacement - Repeat check - Check magnesium  # Right upper extremity weakness-CT of the head ordered and was read as negative for acute intracranial abnormality  #  History of right axillary mass-outpatient follow-up - Patient endorses that he has had difficulty following up with surgeon, his wife has called multiple times to schedule appointments however she received no return phone calls -  Patient does endorse that its gradually and slowly growing in size  # History of Bell's palsy  Chart reviewed.   Echocardiogram on 03/20/2016 was read as ejection fraction estimated at 60%, severe concentric hypertrophy.  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 at midnight Family Communication: Updated uncle, Sonia Side at bedside Disposition Plan: Pending clinical course Consults called: Cardiology Admission status: Progressive cardiac, observation, telemetry  Past Medical History:  Diagnosis Date   Bell's palsy    Biliary colic    Hemorrhoids    Hypercholesterolemia    Hypertension    Stroke (cerebrum) ([place])    Tobacco use    Past Surgical History:  Procedure Laterality Date   VARICOSE VEIN SURGERY Left    Social History:  reports that he has been smoking cigarettes. He has a 50.00 pack-year smoking history. He has never used smokeless tobacco. He reports that he does not drink alcohol and does not use drugs.  No Known Allergies Family History  Problem Relation Age of Onset   Alzheimer's disease Father    Heart disease Father    CAD Neg Hx    Family history: Family history reviewed and pertinent for father with MI at age 75.  Prior to Admission medications   Medication Sig Start Date End Date Taking? Authorizing Provider  amLODipine (NORVASC) 10 MG tablet Take 10 mg by mouth every evening.   Yes [provider]  aspirin please patient for n.p.o. after midnight 81 MG tablet Take 81 mg by mouth daily. 08/04/14  Yes [provider]  hydrALAZINE (APRESOLINE) 25 MG tablet Take 25 mg by mouth 2 (two) times daily.   Yes [provider]  lisinopril-hydrochlorothiazide (ZESTORETIC) 20-25 MG tablet Take 1 tablet by mouth daily.   Yes [provider]  famotidine (PEPCID) 20 MG tablet Take 1 tablet (20 mg total) by  mouth 2 (two) times daily. Patient not taking: Reported on 10/01/2020 05/09/18   Carrie Mew, MD  naproxen (NAPROSYN) 500 MG tablet Take 1 tablet (500 mg total) by mouth 2 (two) times daily with a meal. Patient not taking: Reported on 10/01/2020 05/09/18   Carrie Mew, MD   Physical Exam: Vitals:   10/01/20 1700 10/01/20 1730 10/01/20 1809 10/01/20 1938  BP: (!) 198/85 (!) 188/93 104/81 (!) 164/77  Pulse: (!) 55 (!) 55 (!) 50 (!) 54  Resp: 13 15 18 20   Temp:   97.7 F (36.5 C) 97.7 F (36.5 C)  TempSrc:    Oral  SpO2: 97% 99% 99% 99%  Weight:      Height:       Constitutional: appears age-appropriate, NAD, calm, comfortable Eyes: PERRL, lids and conjunctivae normal ENMT: Mucous membranes are moist. Posterior pharynx clear of any exudate or lesions. Age-appropriate dentition. Hearing appropriate Neck: normal, supple, no masses, no thyromegaly Respiratory: clear to auscultation bilaterally, no wheezing, no crackles. Normal respiratory effort. No accessory muscle use.  Cardiovascular: Regular rate and rhythm, no murmurs / rubs / gallops. No extremity edema. 2+ pedal pulses. No carotid bruits.  Abdomen: Obese abdomen, no tenderness, no masses palpated, no hepatosplenomegaly. Bowel sounds positive.  Musculoskeletal: no clubbing / cyanosis. No joint deformity  upper and lower extremities. Good ROM, no contractures, no atrophy. Normal muscle tone.  Skin: no rashes, lesions, ulcers. No induration Neurologic: Sensation intact. Strength 5/5 in all 4.  Psychiatric: Normal judgment and insight. Alert and oriented x 3. Normal mood.   EKG: Ordered and pending  Chest x-ray on Admission: I personally reviewed and I agree with radiologist reading as below.  DG Chest 2 View  Result Date: 10/01/2020 CLINICAL DATA:  65 year old male with history of chest pain and facial droop. Right-sided weakness. EXAM: CHEST - 2 VIEW COMPARISON:  Chest x-ray 07/01/2019. FINDINGS: Lung volumes are normal. No  consolidative airspace disease. No pleural effusions. No pneumothorax. No pulmonary nodule or mass noted. Pulmonary vasculature and the cardiomediastinal silhouette are within normal limits. IMPRESSION: No radiographic evidence of acute cardiopulmonary disease. Electronically Signed   By: Vinnie Langton M.D.   On: 10/01/2020 12:46   CT HEAD WO CONTRAST  Result Date: 10/01/2020 CLINICAL DATA:  Neurologic deficit. Right-sided weakness in face. History of Bell's palsy. Chest pain. EXAM: CT HEAD WITHOUT CONTRAST TECHNIQUE: Contiguous axial images were obtained from the base of the skull through the vertex without intravenous contrast. COMPARISON:  04/22/2011 FINDINGS: Brain: No evidence of acute infarction, hemorrhage, hydrocephalus, extra-axial collection or mass lesion/mass effect. There is mild diffuse low-attenuation within the subcortical and periventricular white matter compatible with chronic microvascular disease. Vascular: No hyperdense vessel or unexpected calcification. Skull: Normal. Negative for fracture or focal lesion. Sinuses/Orbits: No acute finding. Other: None IMPRESSION: 1. No acute intracranial abnormalities. 2. Mild chronic small vessel ischemic change. Electronically Signed   By: Kerby Moors M.D.   On: 10/01/2020 16:36   CT ANGIO CHEST AORTA W/CM & OR WO/CM  Result Date: 10/01/2020 CLINICAL DATA:  Chest pain and back pain. Aortic dissection suspected. EXAM: CT ANGIOGRAPHY CHEST WITH CONTRAST TECHNIQUE: Multidetector CT imaging of the chest was performed using the standard protocol during bolus administration of intravenous contrast. Multiplanar CT image reconstructions and MIPs were obtained to evaluate the vascular anatomy. CONTRAST:  1mL OMNIPAQUE IOHEXOL 350 MG/ML SOLN COMPARISON:  Chest radiography same day. FINDINGS: Cardiovascular: Mild cardiomegaly. Coronary artery calcification is present. Aortic atherosclerotic calcification is present. No evidence of dissection or  aneurysmal dilatation. Small amount of fluid in the superior pericardial recess. Pulmonary arterial branches appear normal. Mediastinum/Nodes: No mass or lymphadenopathy. Lungs/Pleura: No pleural effusion. No pulmonary edema. No infiltrate or collapse. No mass or nodule. Upper Abdomen: No upper abdominal finding of significance. Musculoskeletal: Chronic degenerative spondylosis and bridging osteophytes. Review of the MIP images confirms the above findings. IMPRESSION: No acute chest pathology by CT.  No aortic aneurysm or dissection. Aortic Atherosclerosis (ICD10-I70.0). Coronary artery calcification. Electronically Signed   By: Nelson Chimes M.D.   On: 10/01/2020 14:36    Labs on Admission: I have personally reviewed following labs  CBC: Recent Labs  Lab 10/01/20 1203  WBC 6.2  HGB 14.7  HCT 42.0  MCV 90.9  PLT 267   Basic Metabolic Panel: Recent Labs  Lab 10/01/20 1203 10/01/20 1411 10/01/20 1847  NA 139  --   --   K 2.7*  --  3.3*  CL 103  --   --   CO2 27  --   --   GLUCOSE 87  --   --   BUN 14  --   --   CREATININE 1.13  --   --   CALCIUM 9.0  --   --   MG  --  2.2  --  GFR: Estimated Creatinine Clearance: 76.9 mL/min (by C-G formula based on SCr of 1.13 mg/dL).  Liver Function Tests: Recent Labs  Lab 10/01/20 1203  AST 22  ALT 14  ALKPHOS 61  BILITOT 0.9  PROT 6.9  ALBUMIN 3.7   Coagulation Profile: Recent Labs  Lab 10/01/20 1200  INR 1.0   Urine analysis:    Component Value Date/Time   COLORURINE Straw 04/22/2011 0951   APPEARANCEUR Clear 04/22/2011 0951   LABSPEC 1.004 04/22/2011 0951   PHURINE 7.0 04/22/2011 0951   GLUCOSEU Negative 04/22/2011 0951   HGBUR Negative 04/22/2011 0951   BILIRUBINUR Negative 04/22/2011 0951   KETONESUR Negative 04/22/2011 0951   PROTEINUR Negative 04/22/2011 0951   NITRITE Negative 04/22/2011 0951   LEUKOCYTESUR Negative 04/22/2011 0951   Dr. Tobie Poet [facility]  If 7PM-7AM, please contact overnight-coverage  provider If 7AM-7PM, please contact day coverage provider [email or web address]  10/01/2020, 11:37 PM

## 2020-10-01 NOTE — Consult Note (Signed)
[facility]   Patient seen and examined. Patient wishes to leave AMA.  Dr. Tobie Poet

## 2020-10-01 NOTE — Telephone Encounter (Signed)
Pt.'s wife reports pt. Is seen at [facility]. She called them about pt. Having chest pain. She reports they "told me to call [facility] to be seen." Pt. Is not seen at the practice. Pt. Having chest and arm pain with numbness to face. Instructed to call 911 now. Verbalizes understanding.

## 2020-10-01 NOTE — ED Notes (Signed)
Pt returned from CT °

## 2020-10-01 NOTE — Telephone Encounter (Signed)
Reason for Disposition  Sounds like a life-threatening emergency to the triager  Answer Assessment - Initial Assessment Questions 1. LOCATION: "Where does it hurt?"       Middle 2. RADIATION: "Does the pain go anywhere else?" (e.g., into neck, jaw, arms, back)     Arm, numbness to face 3. ONSET: "When did the chest pain begin?" (Minutes, hours or days)      Yesterday 4. PATTERN "Does the pain come and go, or has it been constant since it started?"  "Does it get worse with exertion?"      Constant 5. DURATION: "How long does it last" (e.g., seconds, minutes, hours)     Hours 6. SEVERITY: "How bad is the pain?"  (e.g., Scale 1-10; mild, moderate, or severe)    - MILD (1-3): doesn't interfere with normal activities     - MODERATE (4-7): interferes with normal activities or awakens from sleep    - SEVERE (8-10): excruciating pain, unable to do any normal activities       Severe 7. CARDIAC RISK FACTORS: "Do you have any history of heart problems or risk factors for heart disease?" (e.g., angina, prior heart attack; diabetes, high blood pressure, high cholesterol, smoker, or strong family history of heart disease)     No 8. PULMONARY RISK FACTORS: "Do you have any history of lung disease?"  (e.g., blood clots in lung, asthma, emphysema, birth control pills)     No 9. CAUSE: "What do you think is causing the chest pain?"     Unsure 10. OTHER SYMPTOMS: "Do you have any other symptoms?" (e.g., dizziness, nausea, vomiting, sweating, fever, difficulty breathing, cough)       Numbness 11. PREGNANCY: "Is there any chance you are pregnant?" "When was your last menstrual period?"       N/a  Protocols used: Chest Pain-A-AH

## 2020-10-02 ENCOUNTER — Observation Stay (HOSPITAL_BASED_OUTPATIENT_CLINIC_OR_DEPARTMENT_OTHER)
Admit: 2020-10-02 | Discharge: 2020-10-02 | Disposition: A | Discharge status: Home / Self Care | Payer: Self-pay | Attending: Internal Medicine | Admitting: Internal Medicine

## 2020-10-02 DIAGNOSIS — F17200 Nicotine dependence, unspecified, uncomplicated: Secondary | ICD-10-CM

## 2020-10-02 DIAGNOSIS — R079 Chest pain, unspecified: Secondary | ICD-10-CM

## 2020-10-02 DIAGNOSIS — R778 Other specified abnormalities of plasma proteins: Secondary | ICD-10-CM

## 2020-10-02 LAB — CBC
HCT: 41.8 % (ref 39.0–52.0)
Hemoglobin: 14.6 g/dL (ref 13.0–17.0)
MCH: 32.4 pg (ref 26.0–34.0)
MCHC: 34.9 g/dL (ref 30.0–36.0)
MCV: 92.7 fL (ref 80.0–100.0)
Platelets: 192 10*3/uL (ref 150–400)
RBC: 4.51 MIL/uL (ref 4.22–5.81)
RDW: 13.9 % (ref 11.5–15.5)
WBC: 5.8 10*3/uL (ref 4.0–10.5)
nRBC: 0 % (ref 0.0–0.2)

## 2020-10-02 LAB — BASIC METABOLIC PANEL
Anion gap: 7 (ref 5–15)
BUN: 11 mg/dL (ref 8–23)
CO2: 26 mmol/L (ref 22–32)
Calcium: 8.6 mg/dL — ABNORMAL LOW (ref 8.9–10.3)
Chloride: 107 mmol/L (ref 98–111)
Creatinine, Ser: 0.87 mg/dL (ref 0.61–1.24)
GFR, Estimated: >60 mL/min (ref >60)
Glucose, Bld: 90 mg/dL (ref 70–99)
Potassium: 2.8 mmol/L — ABNORMAL LOW (ref 3.5–5.1)
Sodium: 140 mmol/L (ref 135–145)

## 2020-10-02 LAB — HEPARIN LEVEL (UNFRACTIONATED): Heparin Unfractionated: 0.19 IU/mL — ABNORMAL LOW (ref 0.30–0.70)

## 2020-10-02 LAB — ECHOCARDIOGRAM COMPLETE
AR max vel: 3.58 cm2
AV Area VTI: 3.56 cm2
AV Area mean vel: 3.2 cm2
AV Mean grad: 3 mmHg
AV Peak grad: 5.6 mmHg
Ao pk vel: 1.18 m/s
Area-P 1/2: 3.15 cm2
Height: 69 in
S' Lateral: 2.89 cm
Weight: 3520 oz

## 2020-10-02 LAB — MAGNESIUM: Magnesium: 2.2 mg/dL (ref 1.7–2.4)

## 2020-10-02 LAB — HIV ANTIBODY (ROUTINE TESTING W REFLEX): HIV Screen 4th Generation wRfx: NONREACTIVE

## 2020-10-02 MED ORDER — POTASSIUM CHLORIDE CRYS ER 20 MEQ PO TBCR
40.0000 meq | EXTENDED_RELEASE_TABLET | Freq: Once | ORAL | Status: AC
  Administered 2020-10-02: 40 meq via ORAL
  Filled 2020-10-02: qty 2

## 2020-10-02 MED ORDER — HYDRALAZINE HCL 50 MG PO TABS
100.0000 mg | ORAL_TABLET | Freq: Three times a day (TID) | ORAL | Status: DC
  Administered 2020-10-02: 100 mg via ORAL
  Filled 2020-10-02: qty 2

## 2020-10-02 MED ORDER — POTASSIUM CHLORIDE CRYS ER 10 MEQ PO TBCR: 40.0000 meq | EXTENDED_RELEASE_TABLET | Freq: Every day | ORAL | 0 refills | Status: DC

## 2020-10-02 MED ORDER — HEPARIN BOLUS VIA INFUSION
2700.0000 [IU] | INTRAVENOUS | Status: AC
  Administered 2020-10-02: 2700 [IU] via INTRAVENOUS
  Filled 2020-10-02: qty 2700

## 2020-10-02 MED ORDER — HYDRALAZINE HCL 100 MG PO TABS: 100.0000 mg | ORAL_TABLET | Freq: Three times a day (TID) | ORAL | 0 refills | Status: DC

## 2020-10-02 MED ORDER — LISINOPRIL 40 MG PO TABS: 40.0000 mg | ORAL_TABLET | Freq: Every day | ORAL | 0 refills | Status: DC

## 2020-10-02 MED ORDER — POTASSIUM CHLORIDE 10 MEQ/100ML IV SOLN
10.0000 meq | INTRAVENOUS | Status: DC
  Administered 2020-10-02: 10 meq via INTRAVENOUS
  Filled 2020-10-02 (×4): qty 100

## 2020-10-02 MED ORDER — LISINOPRIL 20 MG PO TABS
40.0000 mg | ORAL_TABLET | Freq: Every day | ORAL | Status: DC
  Administered 2020-10-02: 40 mg via ORAL
  Filled 2020-10-02: qty 2

## 2020-10-02 NOTE — Progress Notes (Signed)
*  PRELIMINARY RESULTS* Echocardiogram 2D Echocardiogram has been performed.  Miguel Fletcher 10/02/2020, 9:33 AM

## 2020-10-02 NOTE — Consult Note (Signed)
Cardiology Consultation:   Patient ID: Miguel Fletcher MRN: [id number]; DOB: 1955/10/22  Admit date: 10/01/2020 Date of Consult: 10/02/2020  PCP:  [facility] [place]  Cardiologist:  [facility], Dr. Rockey Situ rounding Advanced Practice Provider:  No care team member to display Electrophysiologist:  None [id number]}    Patient Profile:   Miguel Fletcher is a 65 y.o. male with a hx of hypertension, CVA,, current tobacco use, Bell's palsy, and who is being seen today for the evaluation of right-sided axillary pain at the request of Dr. Roosevelt Locks.  History of Present Illness:   Miguel Fletcher is a 65 year old male with PMH as above.  [facility] cardiology is consulted for chest pain.  Today, 10/02/2020, the patient reports that he does not have any chest pain.  He attributes this report to possibly coming from his wife.  He reports a long history of right sided axillary pain, for which she has sought medical help for in the past.  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  He has a history of hypertension and reports that sometimes he forgets to take his medications.  Most recently, he forgot to refill his medications.  He states that his systolic frequently runs over 200.  He can usually tell when this is the case, as he starts to feel his face becomes flushed.  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  He has no desire to quit smoking, reporting that he quit drugs and alcohol in the past and got married, so he wants to continue to smoke, as it is "1 thing that he has."  He reports that he came into the emergency department at [facility] due to right axillary pain.  He states that it was bothering him on his right flank side and up into his arm.  He denies any chest pain.  He states that his breathing is at baseline.  No  tachypalpitations, presyncope, or syncope.  He reports occasional knee pain and unsteadiness on his feet, attributed to problems with his knees.  He denies any symptoms of volume overload.  No reported symptoms of bleeding.  He reports today that he does not wish to stay at the hospital and wants to leave AMA.  He request that we let his internal medicine doctor know this information, as he will leave the hospital soon.   Past Medical History:  Diagnosis Date   Bell's palsy    Biliary colic    Hemorrhoids    Hypercholesterolemia    Hypertension    Stroke (cerebrum) ([place])    Tobacco use     Past Surgical History:  Procedure Laterality Date   VARICOSE VEIN SURGERY Left      Home Medications:  Prior to Admission medications   Medication Sig Start Date End Date Taking? Authorizing Provider  amLODipine (NORVASC) 10 MG tablet Take 10 mg by mouth every evening.   Yes [provider]  aspirin EC 81 MG tablet Take 81 mg by mouth daily. 08/04/14  Yes [provider]  hydrALAZINE (APRESOLINE) 25 MG tablet Take 25 mg by mouth 2 (two) times daily.   Yes [provider]  lisinopril-hydrochlorothiazide (ZESTORETIC) 20-25 MG tablet Take 1 tablet by mouth daily.   Yes [provider]  famotidine (PEPCID) 20 MG tablet Take 1  tablet (20 mg total) by mouth 2 (two) times daily. Patient not taking: Reported on 10/01/2020 05/09/18   Carrie Mew, MD  naproxen (NAPROSYN) 500 MG tablet Take 1 tablet (500 mg total) by mouth 2 (two) times daily with a meal. Patient not taking: Reported on 10/01/2020 05/09/18   Carrie Mew, MD    Inpatient Medications: Scheduled Meds:  amLODipine  10 mg Oral Daily   aspirin EC  325 mg Oral Daily   heparin  2,700 Units Intravenous STAT   hydrALAZINE  25 mg Oral BID   lisinopril  20 mg Oral Daily   potassium chloride  40 mEq Oral Once   Continuous Infusions:  heparin 1,450 Units/hr (10/02/20 0600)   potassium chloride      PRN Meds: acetaminophen **OR** acetaminophen, ondansetron **OR** ondansetron (ZOFRAN) IV  Allergies:   No Known Allergies  Social History:   Social History   Socioeconomic History   Marital status: Single    Spouse name: Not on file   Number of children: Not on file   Years of education: Not on file   Highest education level: Not on file  Occupational History   Not on file  Tobacco Use   Smoking status: Every Day    Packs/day: 2.00    Years: 25.00    Pack years: 50.00    Types: Cigarettes   Smokeless tobacco: Never  Vaping Use   Vaping Use: Never used  Substance and Sexual Activity   Alcohol use: No   Drug use: No   Sexual activity: Not on file  Other Topics Concern   Not on file  Social History Narrative   Not on file   Social Determinants of Health   Financial Resource Strain: Not on file  Food Insecurity: Not on file  Transportation Needs: Not on file  Physical Activity: Not on file  Stress: Not on file  Social Connections: Not on file  Intimate Partner Violence: Not on file    Family History:    Family History  Problem Relation Age of Onset   Alzheimer's disease Father    Heart disease Father    CAD Neg Hx      ROS:  Please see the history of present illness.  Review of Systems  Constitutional:        Face flushing, attributed to systolics in the [address]H  Respiratory:  Negative for shortness of breath and wheezing.   Cardiovascular:  Negative for chest pain, palpitations, orthopnea, leg swelling and PND.       Right axillary mass pain, unchanged from previous history of right axillary mass pain  Gastrointestinal:  Negative for blood in stool and melena.  Genitourinary:  Positive for flank pain. Negative for dysuria.       Right flank pain, attributed to axillary mass  Musculoskeletal:  Positive for falls, joint pain and myalgias.       Reports falls in the past, attributed to his knees, especially his right knee  Neurological:  Positive for  headaches. Negative for dizziness.   All other ROS reviewed and negative.     Physical Exam/Data:   Vitals:   10/01/20 1730 10/01/20 1809 10/01/20 1938 10/02/20 0428  BP: (!) 188/93 104/81 (!) 164/77 (!) 176/69  Pulse: (!) 55 (!) 50 (!) 54 62  Resp: 15 18 20 20   Temp:  97.7 F (36.5 C) 97.7 F (36.5 C) 98.4 F (36.9 C)  TempSrc:   Oral Oral  SpO2: 99% 99% 99% 97%  Weight:  Height:        Intake/Output Summary (Last 24 hours) at 10/02/2020 0804 Last data filed at 10/01/2020 2359 Gross per 24 hour  Intake 692.07 ml  Output --  Net 692.07 ml   Last 3 Weights 10/01/2020 07/01/2019 05/21/2018  Weight (lbs) 220 lb 220 lb 209 lb  Weight (kg) 99.791 kg 99.791 kg 94.802 kg     Body mass index is 32.49 kg/m.  General:  Well nourished, well developed, in no acute distress.  Seated in chair next to bed. HEENT: normal Lymph: no adenopathy Neck: no JVD Endocrine:  No thryomegaly Vascular: No carotid bruits; radial pulses 2+ bilaterally  cardiac:  normal S1, S2; RRR; no murmur  Lungs: slight bilateral wheezing, rhonchi or rales  Abd: soft, nontender, no hepatomegaly  Ext: no edema  Musculoskeletal:  No deformities, BUE and BLE strength normal and equal Skin: warm and dry  Neuro:  CNs 2-12 intact, no focal abnormalities noted Psych:  Normal affect   EKG:  The EKG was personally reviewed and demonstrates:  Not uploaded. Please upload EKG from ED. Will order additional EKG, as this EKG is not in the folder either.  Telemetry:  Telemetry was personally reviewed and demonstrates: SB- NSR,40-60s  Relevant CV Studies: Echo pending  Laboratory Data:  High Sensitivity Troponin:   Recent Labs  Lab 10/01/20 1203 10/01/20 1411  TROPONINIHS 66* 75*     Chemistry Recent Labs  Lab 10/01/20 1203 10/01/20 1847 10/02/20 0536  NA 139  --  140  K 2.7* 3.3* 2.8*  CL 103  --  107  CO2 27  --  26  GLUCOSE 87  --  90  BUN 14  --  11  CREATININE 1.13  --  0.87  CALCIUM 9.0  --  8.6*   GFRNONAA >60  --  >60  ANIONGAP 9  --  7    Recent Labs  Lab 10/01/20 1203  PROT 6.9  ALBUMIN 3.7  AST 22  ALT 14  ALKPHOS 61  BILITOT 0.9   Hematology Recent Labs  Lab 10/01/20 1203 10/02/20 0536  WBC 6.2 5.8  RBC 4.62 4.51  HGB 14.7 14.6  HCT 42.0 41.8  MCV 90.9 92.7  MCH 31.8 32.4  MCHC 35.0 34.9  RDW 13.5 13.9  PLT 209 192   BNPNo results for input(s): BNP, PROBNP in the last 168 hours.  DDimer No results for input(s): DDIMER in the last 168 hours.   Radiology/Studies:  DG Chest 2 View  Result Date: 10/01/2020 CLINICAL DATA:  65 year old male with history of chest pain and facial droop. Right-sided weakness. EXAM: CHEST - 2 VIEW COMPARISON:  Chest x-ray 07/01/2019. FINDINGS: Lung volumes are normal. No consolidative airspace disease. No pleural effusions. No pneumothorax. No pulmonary nodule or mass noted. Pulmonary vasculature and the cardiomediastinal silhouette are within normal limits. IMPRESSION: No radiographic evidence of acute cardiopulmonary disease. Electronically Signed   By: Vinnie Langton M.D.   On: 10/01/2020 12:46   CT HEAD WO CONTRAST  Result Date: 10/01/2020 CLINICAL DATA:  Neurologic deficit. Right-sided weakness in face. History of Bell's palsy. Chest pain. EXAM: CT HEAD WITHOUT CONTRAST TECHNIQUE: Contiguous axial images were obtained from the base of the skull through the vertex without intravenous contrast. COMPARISON:  04/22/2011 FINDINGS: Brain: No evidence of acute infarction, hemorrhage, hydrocephalus, extra-axial collection or mass lesion/mass effect. There is mild diffuse low-attenuation within the subcortical and periventricular white matter compatible with chronic microvascular disease. Vascular: No hyperdense vessel or unexpected calcification.  Skull: Normal. Negative for fracture or focal lesion. Sinuses/Orbits: No acute finding. Other: None IMPRESSION: 1. No acute intracranial abnormalities. 2. Mild chronic small vessel ischemic change.  Electronically Signed   By: Kerby Moors M.D.   On: 10/01/2020 16:36   CT ANGIO CHEST AORTA W/CM & OR WO/CM  Result Date: 10/01/2020 CLINICAL DATA:  Chest pain and back pain. Aortic dissection suspected. EXAM: CT ANGIOGRAPHY CHEST WITH CONTRAST TECHNIQUE: Multidetector CT imaging of the chest was performed using the standard protocol during bolus administration of intravenous contrast. Multiplanar CT image reconstructions and MIPs were obtained to evaluate the vascular anatomy. CONTRAST:  54mL OMNIPAQUE IOHEXOL 350 MG/ML SOLN COMPARISON:  Chest radiography same day. FINDINGS: Cardiovascular: Mild cardiomegaly. Coronary artery calcification is present. Aortic atherosclerotic calcification is present. No evidence of dissection or aneurysmal dilatation. Small amount of fluid in the superior pericardial recess. Pulmonary arterial branches appear normal. Mediastinum/Nodes: No mass or lymphadenopathy. Lungs/Pleura: No pleural effusion. No pulmonary edema. No infiltrate or collapse. No mass or nodule. Upper Abdomen: No upper abdominal finding of significance. Musculoskeletal: Chronic degenerative spondylosis and bridging osteophytes. Review of the MIP images confirms the above findings. IMPRESSION: No acute chest pathology by CT.  No aortic aneurysm or dissection. Aortic Atherosclerosis (ICD10-I70.0). Coronary artery calcification. Electronically Signed   By: Nelson Chimes M.D.   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  Patient states today he is unclear the reason that cardiology is consulted, as he denies any chest pain, tachypalpitations, presyncope, syncope, nausea, emesis.  He does report face flushing when his BP is elevated around 790 systolic, which she reports occurred at presentation due to not taking his BP medication.  He  reportedly forgot to fill his antihypertensive medication.  -- At bedtime Tn minimally elevated, flat trending at 66, 75.  EKG has not yet been uploaded.  Telemetry shows SB-NSR. --He does have risk factors for CAD, including current tobacco use, age, male, CAC as seen on recent CT, hypertension, hyperlipidemia and family history of heart disease.   --Further ischemic work-up with stress testing was discussed with patient report that he prefers to leave the hospital at this time.   Echo pending If patient stays long enough for completion of the study.   If EF reduced or wall motion abnormality, recommend further ischemic work-up at that time. Continue medical management with ASA +/- statin.   Check LDL for further risk factor stratification. Risk factor modification Heart healthy diet, activity encouraged.   BP control.  Smoking cessation. Repletion of potassium as below.   Check magnesium.   Daily BMET. If patient stays for further work-up: Completion of pending echocardiogram +/-MPI /further ischemic work-up as indicated.  Replete electrolytes.  Control BP.  Essential hypertension, poorly controlled --At presentation, systolics elevated to close to 200.  Patient reports that he usually can feel when his BP is elevated, as he gets flushing in his face.  He denies any associated headache with elevated blood pressure.  He does not experience any presyncope or vision changes.  He does have a history of stroke, and he was advised that letting his BP elevate to close to 200 increases his risk of recurrent stroke.  Patient demonstrates poor understanding of risks associated with BP elevation, which were reviewed today to help educate patient. Continue amlodipine, lisinopril.   Compliance with  antihypertensives in the outpatient setting.  Low-salt diet and fluid restrictions reviewed. Goal BP less than 130/80. If stays in hospital: Check TSH. Consider OP renal artery stenosis work-up, Pt states  blood pressure is never well controlled, even on rx.  Will defer for now.   Hypokalemia, significant -- Potassium 2.8, placing him at risk for arrhythmia.  Replete K with goal 4.0.  Check magnesium.  He denies a history of alcohol use or GI issues.  If ongoing hypokalemia, recommend GI work-up.  History of stroke --Recommend control of BP to prevent risk of CVA.  Also recommend repletion of electrolytes to prevent risk of arrhythmia, including atrial fibrillation, which could also lead to stroke.  Current tobacco use --Smoking cessation recommended.  No EKG present for pt --Request that ED EKG be uploaded for this patient.  EKG is not present and his folder/paper chart or in EMR.  If stays in hospital: Will order additional EKG.                 For questions or updates, please contact [place] Please consult [email or web address] for contact info under    Signed, Arvil Chaco, PA-C  10/02/2020 8:04 AM

## 2020-10-02 NOTE — Progress Notes (Addendum)
[name] to be D/C'd Home per MD order.  Discussed prescriptions and follow up appointments with the patient. Prescriptions given to patient, medication list explained in detail. Pt verbalized understanding.  Allergies as of 10/02/2020   No Known Allergies      Medication List     STOP taking these medications    famotidine 20 MG tablet Commonly known as: PEPCID   lisinopril-hydrochlorothiazide 20-25 MG tablet Commonly known as: ZESTORETIC   naproxen 500 MG tablet Commonly known as: Naprosyn       TAKE these medications    amLODipine 10 MG tablet Commonly known as: NORVASC Take 10 mg by mouth every evening.   aspirin EC 81 MG tablet Take 81 mg by mouth daily.   hydrALAZINE 100 MG tablet Commonly known as: APRESOLINE Take 1 tablet (100 mg total) by mouth 3 (three) times daily. What changed:  medication strength how much to take when to take this   lisinopril 40 MG tablet Commonly known as: ZESTRIL Take 1 tablet (40 mg total) by mouth daily. Start taking on: October 03, 2020   potassium chloride 10 MEQ tablet Commonly known as: KLOR-CON Take 4 tablets (40 mEq total) by mouth daily for 4 days.        Vitals:   10/02/20 0821 10/02/20 1008  BP: (!) 183/74 (!) 167/70  Pulse: (!) 59 62  Resp: 17   Temp: 98.6 F (37 C) 98.6 F (37 C)  SpO2: 98% 98%    Tele box removed and returned. Skin clean, dry and intact without evidence of skin break down, no evidence of skin tears noted. IV catheter discontinued intact. Site without signs and symptoms of complications. Dressing and pressure applied. Pt denies pain at this time. No complaints noted.  An After Visit Summary was printed and given to the patient. Patient escorted via [place], and D/C home via private auto.  Rolley Sims

## 2020-10-02 NOTE — Discharge Summary (Signed)
Physician Discharge Summary  Patient ID: Miguel Fletcher MRN: [id number] DOB/AGE: February 27, 1956 65 y.o.  Admit date: 10/01/2020 Discharge date: 10/02/2020  Admission Diagnoses:  Discharge Diagnoses:  Principal Problem:   Chest pain Active Problems:   Hypokalemia   Essential hypertension   Axillary mass, right   Discharged Condition: good  Hospital Course:  Miguel Fletcher is a 65 y.o. male with medical history significant for hypertension, history of hyperlipidemia, history of Bell's palsy with residual right-sided facial droop, tobacco use, presents to the emergency department for chief concerns of chest pain.  His blood pressure was 193/90, peak troponin 75. Patient has been seen by cardiology, deemed chest pain noncardiac.  Patient refused to stay in the hospital for further work-up or treatment.  I have discussed with Dr. Rockey Fletcher, patient will be discharged home with follow-up with cardiology and PCP. Patient has not been seen by his PCP for a long time, has not been taking his medicines.  Prescriptions are restarted.  #1.  Chest pain other. Elevated troponin secondary to hypertension urgency. Non-STEMI ruled out.  #2.  Right axillary nerve sheath tumor. Patient appears to have a pain in the right upper arm as well as right chest. I will refer patient to general surgery as outpatient.  3.  Hypertension emergency. Blood pressure medicine adjusted.  4.  Hypokalemia. Patient will be receiving IV as well as oral potassium.  Patient appeared to have a large deficit of potassium, I will continue 40 mEq of oral potassium daily for 4 days.  Follow-up with PCP as outpatient.  5.  History of Bell palsy   Consults: cardiology  Significant Diagnostic Studies:   CT ANGIOGRAPHY CHEST WITH CONTRAST   TECHNIQUE: Multidetector CT imaging of the chest was performed using the standard protocol during bolus administration of intravenous contrast. Multiplanar CT image reconstructions  and MIPs were obtained to evaluate the vascular anatomy.   CONTRAST:  32mL OMNIPAQUE IOHEXOL 350 MG/ML SOLN   COMPARISON:  Chest radiography same day.   FINDINGS: Cardiovascular: Mild cardiomegaly. Coronary artery calcification is present. Aortic atherosclerotic calcification is present. No evidence of dissection or aneurysmal dilatation. Small amount of fluid in the superior pericardial recess. Pulmonary arterial branches appear normal.   Mediastinum/Nodes: No mass or lymphadenopathy.   Lungs/Pleura: No pleural effusion. No pulmonary edema. No infiltrate or collapse. No mass or nodule.   Upper Abdomen: No upper abdominal finding of significance.   Musculoskeletal: Chronic degenerative spondylosis and bridging osteophytes.   Review of the MIP images confirms the above findings.   IMPRESSION: No acute chest pathology by CT.  No aortic aneurysm or dissection.   Aortic Atherosclerosis (ICD10-I70.0). Coronary artery calcification.     Electronically Signed   By: Miguel Fletcher M.D.   On: 10/01/2020 14:36  Treatments: Lisinopril, metoprolol, hydralazine  Discharge Exam: Blood pressure (!) 183/74, pulse (!) 59, temperature 98.6 F (37 C), resp. rate 17, height 5\' 9"  (1.753 m), weight 99.8 kg, SpO2 98 %. General appearance: alert and cooperative Resp: clear to auscultation bilaterally Cardio: regular rate and rhythm, S1, S2 normal, no murmur, click, rub or gallop GI: soft, non-tender; bowel sounds normal; no masses,  no organomegaly Extremities: extremities normal, atraumatic, no cyanosis or edema  Disposition: Discharge disposition: 01-Home or Self Care       Discharge Instructions     Diet - low sodium heart healthy   Complete by: As directed    Increase activity slowly   Complete by: As directed  Allergies as of 10/02/2020   No Known Allergies      Medication List     STOP taking these medications    famotidine 20 MG tablet Commonly known as:  PEPCID   lisinopril-hydrochlorothiazide 20-25 MG tablet Commonly known as: ZESTORETIC   naproxen 500 MG tablet Commonly known as: Naprosyn       TAKE these medications    amLODipine 10 MG tablet Commonly known as: NORVASC Take 10 mg by mouth every evening.   aspirin EC 81 MG tablet Take 81 mg by mouth daily.   hydrALAZINE 100 MG tablet Commonly known as: APRESOLINE Take 1 tablet (100 mg total) by mouth 3 (three) times daily. What changed:  medication strength how much to take when to take this   lisinopril 40 MG tablet Commonly known as: ZESTRIL Take 1 tablet (40 mg total) by mouth daily. Start taking on: October 03, 2020   potassium chloride 10 MEQ tablet Commonly known as: KLOR-CON Take 4 tablets (40 mEq total) by mouth daily for 4 days.        Follow-up [place], [facility] Follow up in 1 week(s).   Specialty: General Practice Contact information: [place] [address] [phone]         Minna Merritts, MD Follow up in 2 week(s).   Specialty: Cardiology Contact information: [place] [address] [phone]         Benjamine Sprague, DO Follow up in 4 week(s).   Specialty: Surgery Contact information: [address] [place] [address] [phone]                 Signed: Sharen Hones 10/02/2020, 9:41 AM

## 2020-10-02 NOTE — Progress Notes (Signed)
[facility] for heparin Indication: chest pain/ACS  No Known Allergies  Patient Measurements: Height: 5\' 9"  (175.3 cm) Weight: 99.8 kg (220 lb) IBW/kg (Calculated) : 70.7 Heparin Dosing Weight: 91 kg  Vital Signs: Temp: 98.4 F (36.9 C) (07/15 0428) Temp Source: Oral (07/15 0428) BP: 176/69 (07/15 0428) Pulse Rate: 62 (07/15 0428)  Labs: Recent Labs    10/01/20 1200 10/01/20 1203 10/01/20 1411 10/01/20 2158 10/02/20 0536  HGB  --  14.7  --   --  14.6  HCT  --  42.0  --   --  41.8  PLT  --  209  --   --  192  APTT 32  --   --   --   --   LABPROT 13.5  --   --   --   --   INR 1.0  --   --   --   --   HEPARINUNFRC  --   --   --  <0.10* 0.19*  CREATININE  --  1.13  --   --  0.87  TROPONINIHS  --  66* 75*  --   --      Estimated Creatinine Clearance: 99.9 mL/min (by C-G formula based on SCr of 0.87 mg/dL).   Medical History: Past Medical History:  Diagnosis Date   Bell's palsy    Biliary colic    Hemorrhoids    Hypercholesterolemia    Hypertension    Stroke (cerebrum) ([facility])    Tobacco use     Assessment: 65 year old male presented with chest pain. No anticoagulation PTA noted on medication review. Pharmacy consult for heparin.  Goal of Therapy:  Heparin level 0.3-0.7 units/ml Monitor platelets by anticoagulation protocol: Yes  07/14 2158 HL <0.10 07/15 0536 HL 0.19   Plan:  Heparin 2700 unit bolus Increase heparin drip to 1700 units/hr Recheck HL in 6 hours after rate change CBC daily while on heparin drip  Renda Rolls, PharmD, [facility] 10/02/2020 7:03 AM

## 2020-10-05 ENCOUNTER — Emergency Department: Payer: Self-pay

## 2020-10-05 ENCOUNTER — Encounter: Payer: Self-pay | Admitting: Emergency Medicine

## 2020-10-05 ENCOUNTER — Inpatient Hospital Stay
Admission: EM | Admit: 2020-10-05 | Discharge: 2020-10-07 | DRG: 280 | Disposition: A | Discharge status: Home / Self Care | Payer: Self-pay | Attending: Family Medicine | Admitting: Family Medicine

## 2020-10-05 ENCOUNTER — Other Ambulatory Visit: Payer: Self-pay

## 2020-10-05 DIAGNOSIS — Z7982 Long term (current) use of aspirin: Secondary | ICD-10-CM

## 2020-10-05 DIAGNOSIS — I444 Left anterior fascicular block: Secondary | ICD-10-CM | POA: Diagnosis present

## 2020-10-05 DIAGNOSIS — D3612 Benign neoplasm of peripheral nerves and autonomic nervous system, upper limb, including shoulder: Secondary | ICD-10-CM | POA: Diagnosis present

## 2020-10-05 DIAGNOSIS — R29810 Facial weakness: Secondary | ICD-10-CM | POA: Diagnosis present

## 2020-10-05 DIAGNOSIS — Z8249 Family history of ischemic heart disease and other diseases of the circulatory system: Secondary | ICD-10-CM

## 2020-10-05 DIAGNOSIS — E876 Hypokalemia: Secondary | ICD-10-CM

## 2020-10-05 DIAGNOSIS — E7800 Pure hypercholesterolemia, unspecified: Secondary | ICD-10-CM | POA: Diagnosis present

## 2020-10-05 DIAGNOSIS — I639 Cerebral infarction, unspecified: Secondary | ICD-10-CM

## 2020-10-05 DIAGNOSIS — F17210 Nicotine dependence, cigarettes, uncomplicated: Secondary | ICD-10-CM | POA: Diagnosis present

## 2020-10-05 DIAGNOSIS — U071 COVID-19: Secondary | ICD-10-CM

## 2020-10-05 DIAGNOSIS — I10 Essential (primary) hypertension: Secondary | ICD-10-CM

## 2020-10-05 DIAGNOSIS — E669 Obesity, unspecified: Secondary | ICD-10-CM | POA: Diagnosis present

## 2020-10-05 DIAGNOSIS — I214 Non-ST elevation (NSTEMI) myocardial infarction: Principal | ICD-10-CM

## 2020-10-05 DIAGNOSIS — Z8673 Personal history of transient ischemic attack (TIA), and cerebral infarction without residual deficits: Secondary | ICD-10-CM

## 2020-10-05 DIAGNOSIS — Z79899 Other long term (current) drug therapy: Secondary | ICD-10-CM

## 2020-10-05 DIAGNOSIS — Z6829 Body mass index (BMI) 29.0-29.9, adult: Secondary | ICD-10-CM

## 2020-10-05 DIAGNOSIS — Z720 Tobacco use: Secondary | ICD-10-CM

## 2020-10-05 DIAGNOSIS — E785 Hyperlipidemia, unspecified: Secondary | ICD-10-CM

## 2020-10-05 HISTORY — DX: Localized swelling, mass and lump, right upper limb: R22.31

## 2020-10-05 LAB — CBC
HCT: 43.7 % (ref 39.0–52.0)
Hemoglobin: 14.9 g/dL (ref 13.0–17.0)
MCH: 31.5 pg (ref 26.0–34.0)
MCHC: 34.1 g/dL (ref 30.0–36.0)
MCV: 92.4 fL (ref 80.0–100.0)
Platelets: 202 10*3/uL (ref 150–400)
RBC: 4.73 MIL/uL (ref 4.22–5.81)
RDW: 13.7 % (ref 11.5–15.5)
WBC: 6.3 10*3/uL (ref 4.0–10.5)
nRBC: 0 % (ref 0.0–0.2)

## 2020-10-05 LAB — TROPONIN I (HIGH SENSITIVITY)
Troponin I (High Sensitivity): 144 ng/L (ref <18)
Troponin I (High Sensitivity): 101 ng/L (ref <18)
Troponin I (High Sensitivity): 131 ng/L (ref <18)
Troponin I (High Sensitivity): 208 ng/L (ref <18)
Troponin I (High Sensitivity): 257 ng/L (ref <18)

## 2020-10-05 LAB — BASIC METABOLIC PANEL
Anion gap: 8 (ref 5–15)
BUN: 13 mg/dL (ref 8–23)
CO2: 27 mmol/L (ref 22–32)
Calcium: 9 mg/dL (ref 8.9–10.3)
Chloride: 104 mmol/L (ref 98–111)
Creatinine, Ser: 1.02 mg/dL (ref 0.61–1.24)
GFR, Estimated: >60 mL/min (ref >60)
Glucose, Bld: 98 mg/dL (ref 70–99)
Potassium: 3.2 mmol/L — ABNORMAL LOW (ref 3.5–5.1)
Sodium: 139 mmol/L (ref 135–145)

## 2020-10-05 LAB — HEPATIC FUNCTION PANEL
ALT: 20 U/L (ref 0–44)
AST: 26 U/L (ref 15–41)
Albumin: 3.7 g/dL (ref 3.5–5.0)
Alkaline Phosphatase: 55 U/L (ref 38–126)
Bilirubin, Direct: 0.2 mg/dL (ref 0.0–0.2)
Indirect Bilirubin: 0.9 mg/dL (ref 0.3–0.9)
Total Bilirubin: 1.1 mg/dL (ref 0.3–1.2)
Total Protein: 7.1 g/dL (ref 6.5–8.1)

## 2020-10-05 LAB — HEPARIN LEVEL (UNFRACTIONATED): Heparin Unfractionated: 0.28 IU/mL — ABNORMAL LOW (ref 0.30–0.70)

## 2020-10-05 LAB — APTT: aPTT: 29 seconds (ref 24–36)

## 2020-10-05 LAB — BRAIN NATRIURETIC PEPTIDE: B Natriuretic Peptide: 130.9 pg/mL — ABNORMAL HIGH (ref 0.0–100.0)

## 2020-10-05 LAB — PROTIME-INR
INR: 1 (ref 0.8–1.2)
Prothrombin Time: 13 seconds (ref 11.4–15.2)

## 2020-10-05 LAB — RESP PANEL BY RT-PCR (FLU A&B, COVID) ARPGX2
Influenza A by PCR: NEGATIVE
Influenza B by PCR: NEGATIVE
SARS Coronavirus 2 by RT PCR: POSITIVE — AB

## 2020-10-05 LAB — MAGNESIUM: Magnesium: 2.1 mg/dL (ref 1.7–2.4)

## 2020-10-05 LAB — HEMOGLOBIN A1C
Hgb A1c MFr Bld: 5.1 % (ref 4.8–5.6)
Mean Plasma Glucose: 99.67 mg/dL

## 2020-10-05 LAB — LIPASE, BLOOD: Lipase: 37 U/L (ref 11–51)

## 2020-10-05 MED ORDER — HYDROMORPHONE HCL 1 MG/ML IJ SOLN
0.5000 mg | Freq: Once | INTRAMUSCULAR | Status: AC
  Administered 2020-10-05: 0.5 mg via INTRAVENOUS
  Filled 2020-10-05: qty 1

## 2020-10-05 MED ORDER — ONDANSETRON HCL 4 MG/2ML IJ SOLN
4.0000 mg | Freq: Once | INTRAMUSCULAR | Status: AC
  Administered 2020-10-05: 4 mg via INTRAVENOUS
  Filled 2020-10-05: qty 2

## 2020-10-05 MED ORDER — AMLODIPINE BESYLATE 10 MG PO TABS
10.0000 mg | ORAL_TABLET | Freq: Every evening | ORAL | Status: DC
  Administered 2020-10-05 – 2020-10-06 (×2): 10 mg via ORAL
  Filled 2020-10-05 (×2): qty 1

## 2020-10-05 MED ORDER — PANTOPRAZOLE SODIUM 40 MG IV SOLR
40.0000 mg | Freq: Once | INTRAVENOUS | Status: AC
  Administered 2020-10-05: 40 mg via INTRAVENOUS
  Filled 2020-10-05: qty 40

## 2020-10-05 MED ORDER — ALUM & MAG HYDROXIDE-SIMETH 200-200-20 MG/5ML PO SUSP
30.0000 mL | Freq: Once | ORAL | Status: AC
  Administered 2020-10-05: 30 mL via ORAL
  Filled 2020-10-05: qty 30

## 2020-10-05 MED ORDER — ALBUTEROL SULFATE HFA 108 (90 BASE) MCG/ACT IN AERS
2.0000 | INHALATION_SPRAY | RESPIRATORY_TRACT | Status: DC | PRN
  Filled 2020-10-05: qty 6.7

## 2020-10-05 MED ORDER — HYDRALAZINE HCL 50 MG PO TABS
100.0000 mg | ORAL_TABLET | Freq: Three times a day (TID) | ORAL | Status: DC
  Administered 2020-10-05 – 2020-10-07 (×5): 100 mg via ORAL
  Filled 2020-10-05 (×6): qty 2

## 2020-10-05 MED ORDER — ASPIRIN 81 MG PO CHEW
81.0000 mg | CHEWABLE_TABLET | Freq: Every day | ORAL | Status: DC
  Administered 2020-10-05 – 2020-10-07 (×3): 81 mg via ORAL
  Filled 2020-10-05 (×3): qty 1

## 2020-10-05 MED ORDER — NICOTINE 21 MG/24HR TD PT24
21.0000 mg | MEDICATED_PATCH | Freq: Every day | TRANSDERMAL | Status: DC
  Filled 2020-10-05: qty 1

## 2020-10-05 MED ORDER — HEPARIN BOLUS VIA INFUSION
1400.0000 [IU] | Freq: Once | INTRAVENOUS | Status: AC
  Administered 2020-10-05: 1400 [IU] via INTRAVENOUS
  Filled 2020-10-05: qty 1400

## 2020-10-05 MED ORDER — HYDROXYZINE HCL 10 MG PO TABS
10.0000 mg | ORAL_TABLET | Freq: Three times a day (TID) | ORAL | Status: DC | PRN
  Filled 2020-10-05: qty 1

## 2020-10-05 MED ORDER — CARVEDILOL 3.125 MG PO TABS
3.1250 mg | ORAL_TABLET | Freq: Two times a day (BID) | ORAL | Status: DC
  Administered 2020-10-05 – 2020-10-07 (×2): 3.125 mg via ORAL
  Filled 2020-10-05 (×4): qty 1

## 2020-10-05 MED ORDER — SODIUM CHLORIDE 0.9% FLUSH
3.0000 mL | Freq: Two times a day (BID) | INTRAVENOUS | Status: DC
  Administered 2020-10-05 – 2020-10-06 (×3): 3 mL via INTRAVENOUS

## 2020-10-05 MED ORDER — ACETAMINOPHEN 325 MG PO TABS
650.0000 mg | ORAL_TABLET | Freq: Four times a day (QID) | ORAL | Status: DC | PRN
  Administered 2020-10-05 (×2): 650 mg via ORAL
  Filled 2020-10-05 (×2): qty 2

## 2020-10-05 MED ORDER — HEPARIN (PORCINE) 25000 UT/250ML-% IV SOLN
1700.0000 [IU]/h | INTRAVENOUS | Status: DC
  Administered 2020-10-05: 1500 [IU]/h via INTRAVENOUS
  Administered 2020-10-05: 1700 [IU]/h via INTRAVENOUS
  Filled 2020-10-05 (×3): qty 250

## 2020-10-05 MED ORDER — HEPARIN BOLUS VIA INFUSION
4000.0000 [IU] | Freq: Once | INTRAVENOUS | Status: AC
  Administered 2020-10-05: 4000 [IU] via INTRAVENOUS
  Filled 2020-10-05: qty 4000

## 2020-10-05 MED ORDER — DM-GUAIFENESIN ER 30-600 MG PO TB12: 1.0000 | ORAL_TABLET | Freq: Two times a day (BID) | ORAL | Status: DC | PRN

## 2020-10-05 MED ORDER — MORPHINE SULFATE (PF) 2 MG/ML IV SOLN: 2.0000 mg | INTRAVENOUS | Status: DC | PRN

## 2020-10-05 MED ORDER — LISINOPRIL 20 MG PO TABS
40.0000 mg | ORAL_TABLET | Freq: Every day | ORAL | Status: DC
  Administered 2020-10-06 – 2020-10-07 (×2): 40 mg via ORAL
  Filled 2020-10-05 (×2): qty 2

## 2020-10-05 MED ORDER — NITROGLYCERIN 0.4 MG SL SUBL
0.4000 mg | SUBLINGUAL_TABLET | SUBLINGUAL | Status: DC | PRN
  Administered 2020-10-05: 0.4 mg via SUBLINGUAL
  Filled 2020-10-05: qty 1

## 2020-10-05 MED ORDER — CLOPIDOGREL BISULFATE 75 MG PO TABS
300.0000 mg | ORAL_TABLET | Freq: Once | ORAL | Status: AC
  Administered 2020-10-05: 300 mg via ORAL
  Filled 2020-10-05: qty 4

## 2020-10-05 MED ORDER — ATORVASTATIN CALCIUM 20 MG PO TABS
40.0000 mg | ORAL_TABLET | Freq: Every day | ORAL | Status: DC
  Administered 2020-10-05 – 2020-10-07 (×3): 40 mg via ORAL
  Filled 2020-10-05 (×3): qty 2

## 2020-10-05 MED ORDER — HYDRALAZINE HCL 20 MG/ML IJ SOLN
5.0000 mg | INTRAMUSCULAR | Status: DC | PRN
  Administered 2020-10-05: 5 mg via INTRAVENOUS
  Filled 2020-10-05: qty 1

## 2020-10-05 MED ORDER — ASPIRIN EC 81 MG PO TBEC: 81.0000 mg | DELAYED_RELEASE_TABLET | Freq: Every day | ORAL | Status: DC

## 2020-10-05 MED ORDER — POTASSIUM CHLORIDE CRYS ER 20 MEQ PO TBCR
40.0000 meq | EXTENDED_RELEASE_TABLET | Freq: Once | ORAL | Status: AC
  Administered 2020-10-05: 40 meq via ORAL
  Filled 2020-10-05: qty 2

## 2020-10-05 NOTE — Progress Notes (Signed)
   10/05/20 1954  Vitals  Temp (!) 101 F (38.3 C)  Temp Source Oral  BP (!) 156/60  Pulse Rate 75  ECG Heart Rate 77  Resp (!) 23  Level of Consciousness  Level of Consciousness Alert  MEWS COLOR  MEWS Score Color Yellow  Oxygen Therapy  SpO2 98 %  O2 Device Room Air  Pain Assessment  Pain Scale 0-10  Pain Score 0  POSS Scale (Pasero Opioid Sedation Scale)  POSS *See Group Information* 1-Acceptable,Awake and alert  MEWS Score  MEWS Temp 1  MEWS Systolic 0  MEWS Pulse 0  MEWS RR 1  MEWS LOC 0  MEWS Score 2   Patient generated Yellow MEWS at 1954 d/t elevated temp 101.0.  Patient assymptomatic.  Dr. Sidney Ace covering notified.  No new orders.  Patient given prn tylenol.  Will continue to monitor closely.

## 2020-10-05 NOTE — ED Triage Notes (Signed)
Pt via EMS from home. Pt c/o centralized non-radiating CP that started around 0600 this AM. Pt was recently started on BP medications. EMS gave 324 ASA and 1 Nitro Spray. Denies SOB but did say he did have Sob earlier. Denies NVD. Pt is A&Ox4 and NAD.

## 2020-10-05 NOTE — Consult Note (Addendum)
ANTICOAGULATION CONSULT NOTE - Initial Consult  Pharmacy Consult for Heparin infusion Indication: chest pain/ACS  No Known Allergies  Patient Measurements: Height: 5\' 10"  (177.8 cm) Weight: 94.8 kg (209 lb) IBW/kg (Calculated) : 73 Heparin Dosing Weight: 92.4 kg   Vital Signs: Temp: 100.7 F (38.2 C) (07/18 1452) Temp Source: Oral (07/18 1422) BP: 156/86 (07/18 1452) Pulse Rate: 92 (07/18 1452)  Labs: Recent Labs    10/05/20 0811 10/05/20 1011 10/05/20 1332 10/05/20 1636  HGB 14.9  --   --   --   HCT 43.7  --   --   --   PLT 202  --   --   --   APTT 29  --   --   --   LABPROT 13.0  --   --   --   INR 1.0  --   --   --   HEPARINUNFRC  --   --   --  0.28*  CREATININE 1.02  --   --   --   TROPONINIHS 144* 101* 131*  --      Estimated Creatinine Clearance: 84.5 mL/min (by C-G formula based on SCr of 1.02 mg/dL).   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 08/2020 MRI [facility]): Well-circumscribed T1 hypointense, T2 hyperintense enhancing mass within the right axilla measuring 3.1 x 2.6 x 3.0 cm.  Mass extends along the course of the axillary neurovascular structures.  No evidence for invasion into the adjacent musculature.  No enlarged axillary lymph nodes.   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 Troponin 144. Pharmacy has been consulted for initiation and management of heparin infusion for ACS/chest pain.   Date/Time HL Comment 7/18 @1636  0.28 Subtherapeutic, 1500 u/hr - confirmed with nurse no issues with heparin infusion or line  Goal of Therapy:  Heparin level 0.3-0.7  units/ml Monitor platelets by anticoagulation protocol: Yes   Plan:  HL subtherapeutic, will give 1400 unit bolus and increase the heparin infusion to 1700 units/hr Check anti-Xa level in 6 hours and daily while on heparin Continue to monitor H&H and platelets  Sherilyn Banker, PharmD Clinical Pharmacist   10/05/2020,5:00 PM

## 2020-10-05 NOTE — ED Notes (Signed)
MD Niu at bedside  

## 2020-10-05 NOTE — H&P (Addendum)
History and Physical    Miguel Fletcher HKV:[id number] DOB: February 11, 1956 DOA: 10/05/2020  Referring MD/NP/PA:   PCP: [facility]   Patient coming from:  The patient is coming from home.  At baseline, pt is independent for most of ADL.        Chief Complaint: chest pain  HPI: Miguel Fletcher is a 65 y.o. male with medical history significant of hypertension, hyperlipidemia, stroke, tobacco use, history of Bell's palsy with residual right-sided facial droop, who presents with chest pain.  Patient was recently hospitalized from 07/14-7/15 due to chest pain.  Patient had negative CT angiogram for aortic dissection. His troponin was up to 75, possibly due to demand ischemia. Per previous discharge summary, patient refused to stay in the hospital for further work-up or treatment.  Pt states that his chest pain comes back again, which is located in substernal area, mild to moderate, pressure-like, radiating to both arms and hands.  Patient denies shortness breath, no cough, fever or chills.  Denies nausea, vomiting, diarrhea or abdominal pain.  No symptoms of UTI.  ED Course: pt was found to have troponin level 144 --> 101, positive COVID PCR, potassium 3.2, renal function okay, temperature 99.9, blood pressure 197/72, 186/72, heart rate 91, RR 30, oxygen saturation 92-98% on room air.  Chest x-ray negative for infiltration.  Patient is admitted to progressive bed as inpatient.  Dr. Rockey Situ of cardiology is consulted.  Review of Systems:   General: no fevers, chills, no body weight gain, has fatigue HEENT: no blurry vision, hearing changes or sore throat Respiratory: no dyspnea, coughing, wheezing CV: has chest pain, no palpitations GI: no nausea, vomiting, abdominal pain, diarrhea, constipation GU: no dysuria, burning on urination, increased urinary frequency, hematuria  Ext: no leg edema Neuro: no unilateral weakness, numbness, or tingling, no vision change or hearing  loss Skin: no rash, no skin tear. MSK: No muscle spasm, no deformity, no limitation of range of movement in spin Heme: No easy bruising.  Travel history: No recent long distant travel.  Allergy: No Known Allergies  Past Medical History:  Diagnosis Date   Bell's palsy    Biliary colic    Hemorrhoids    Hypercholesterolemia    Hypertension    Stroke (cerebrum) ([place])    Tobacco use     Past Surgical History:  Procedure Laterality Date   VARICOSE VEIN SURGERY Left     Social History:  reports that he has been smoking cigarettes. He has a 50.00 pack-year smoking history. He has never used smokeless tobacco. He reports that he does not drink alcohol and does not use drugs.  Family History:  Family History  Problem Relation Age of Onset   Alzheimer's disease Father    Heart disease Father    CAD Neg Hx      Prior to Admission medications   Medication Sig Start Date End Date Taking? Authorizing Provider  amLODipine (NORVASC) 10 MG tablet Take 10 mg by mouth every evening.    [provider]  aspirin EC 81 MG tablet Take 81 mg by mouth daily. 08/04/14   [provider]  hydrALAZINE (APRESOLINE) 100 MG tablet Take 1 tablet (100 mg total) by mouth 3 (three) times daily. 10/02/20   Sharen Hones, MD  lisinopril (ZESTRIL) 40 MG tablet Take 1 tablet (40 mg total) by mouth daily. 10/03/20   Sharen Hones, MD  potassium chloride (KLOR-CON) 10 MEQ tablet Take 4 tablets (40 mEq total) by mouth  daily for 4 days. 10/02/20 10/06/20  Sharen Hones, MD    Physical Exam: Vitals:   10/05/20 0830 10/05/20 0928 10/05/20 0930 10/05/20 1030  BP: (!) 177/74 (!) 191/78 (!) 197/72 (!) 186/72  Pulse: 77 83 85 91  Resp: (!) 25 (!) 30 (!) 21 (!) 21  Temp:      TempSrc:      SpO2: 97% 92% 92% 94%  Weight:      Height:       General: Not in acute distress HEENT:       Eyes: PERRL, EOMI, no scleral icterus.       ENT: No discharge from the ears and nose, no pharynx injection, no  tonsillar enlargement.        Neck: No JVD, no bruit, no mass felt. Heme: No neck lymph node enlargement. Cardiac: S1/S2, RRR, No murmurs, No gallops or rubs. Respiratory: No rales, wheezing, rhonchi or rubs. GI: Soft, nondistended, nontender, no rebound pain, no organomegaly, BS present. GU: No hematuria Ext: No pitting leg edema bilaterally. 1+DP/PT pulse bilaterally. Musculoskeletal: No joint deformities, No joint redness or warmth, no limitation of ROM in spin. Skin: No rashes.  Neuro: Alert, oriented X3, cranial nerves II-XII grossly intact, moves all extremities normally.  Psych: Patient is not psychotic, no suicidal or hemocidal ideation.  Labs on Admission: I have personally reviewed following labs and imaging studies  CBC: Recent Labs  Lab 10/01/20 1203 10/02/20 0536 10/05/20 0811  WBC 6.2 5.8 6.3  HGB 14.7 14.6 14.9  HCT 42.0 41.8 43.7  MCV 90.9 92.7 92.4  PLT 209 192 751   Basic Metabolic Panel: Recent Labs  Lab 10/01/20 1203 10/01/20 1411 10/01/20 1847 10/02/20 0536 10/05/20 0811  NA 139  --   --  140 139  K 2.7*  --  3.3* 2.8* 3.2*  CL 103  --   --  107 104  CO2 27  --   --  26 27  GLUCOSE 87  --   --  90 98  BUN 14  --   --  11 13  CREATININE 1.13  --   --  0.87 1.02  CALCIUM 9.0  --   --  8.6* 9.0  MG  --  2.2  --  2.2  --    GFR: Estimated Creatinine Clearance: 84.5 mL/min (by C-G formula based on SCr of 1.02 mg/dL). Liver Function Tests: Recent Labs  Lab 10/01/20 1203 10/05/20 0811  AST 22 26  ALT 14 20  ALKPHOS 61 55  BILITOT 0.9 1.1  PROT 6.9 7.1  ALBUMIN 3.7 3.7   Recent Labs  Lab 10/05/20 0811  LIPASE 37   No results for input(s): AMMONIA in the last 168 hours. Coagulation Profile: Recent Labs  Lab 10/01/20 1200 10/05/20 0811  INR 1.0 1.0   Cardiac Enzymes: No results for input(s): CKTOTAL, CKMB, CKMBINDEX, TROPONINI in the last 168 hours. BNP (last 3 results) No results for input(s): PROBNP in the last 8760  hours. HbA1C: No results for input(s): HGBA1C in the last 72 hours. CBG: No results for input(s): GLUCAP in the last 168 hours. Lipid Profile: No results for input(s): CHOL, HDL, LDLCALC, TRIG, CHOLHDL, LDLDIRECT in the last 72 hours. Thyroid Function Tests: No results for input(s): TSH, T4TOTAL, FREET4, T3FREE, THYROIDAB in the last 72 hours. Anemia Panel: No results for input(s): VITAMINB12, FOLATE, FERRITIN, TIBC, IRON, RETICCTPCT in the last 72 hours. Urine analysis:    Component Value Date/Time   COLORURINE Straw  04/22/2011 0951   APPEARANCEUR Clear 04/22/2011 0951   LABSPEC 1.004 04/22/2011 0951   PHURINE 7.0 04/22/2011 0951   GLUCOSEU Negative 04/22/2011 0951   HGBUR Negative 04/22/2011 0951   BILIRUBINUR Negative 04/22/2011 0951   KETONESUR Negative 04/22/2011 0951   PROTEINUR Negative 04/22/2011 0951   NITRITE Negative 04/22/2011 0951   LEUKOCYTESUR Negative 04/22/2011 0951   Sepsis Labs: @LABRCNTIP (procalcitonin:4,lacticidven:4) ) Recent Results (from the past 240 hour(s))  Resp Panel by RT-PCR (Flu A&B, Covid) Nasopharyngeal Swab     Status: None   Collection Time: 10/01/20 12:00 PM   Specimen: Nasopharyngeal Swab; Nasopharyngeal(NP) swabs in vial transport medium  Result Value Ref Range Status   SARS Coronavirus 2 by RT PCR NEGATIVE NEGATIVE Final    Comment: (NOTE) SARS-CoV-2 target nucleic acids are NOT DETECTED.  The SARS-CoV-2 RNA is generally detectable in upper respiratory specimens during the acute phase of infection. The lowest concentration of SARS-CoV-2 viral copies this assay can detect is 138 copies/mL. A negative result does not preclude SARS-Cov-2 infection and should not be used as the sole basis for treatment or other patient management decisions. A negative result may occur with  improper specimen collection/handling, submission of specimen other than nasopharyngeal swab, presence of viral mutation(s) within the areas targeted by this assay,  and inadequate number of viral copies(<138 copies/mL). A negative result must be combined with clinical observations, patient history, and epidemiological information. The expected result is Negative.  Fact Sheet for Patients:  EntrepreneurPulse.com.au  Fact Sheet for Healthcare Providers:  IncredibleEmployment.be  This test is no t yet approved or cleared by the [place] FDA and  has been authorized for detection and/or diagnosis of SARS-CoV-2 by FDA under an Emergency Use Authorization (EUA). This EUA will remain  in effect (meaning this test can be used) for the duration of the COVID-19 declaration under Section 564(b)(1) of the Act, 21 U.S.C.section 360bbb-3(b)(1), unless the authorization is terminated  or revoked sooner.       Influenza A by PCR NEGATIVE NEGATIVE Final   Influenza B by PCR NEGATIVE NEGATIVE Final    Comment: (NOTE) The Xpert Xpress SARS-CoV-2/FLU/RSV plus assay is intended as an aid in the diagnosis of influenza from Nasopharyngeal swab specimens and should not be used as a sole basis for treatment. Nasal washings and aspirates are unacceptable for Xpert Xpress SARS-CoV-2/FLU/RSV testing.  Fact Sheet for Patients: EntrepreneurPulse.com.au  Fact Sheet for Healthcare Providers: IncredibleEmployment.be  This test is not yet approved or cleared by the [place] FDA and has been authorized for detection and/or diagnosis of SARS-CoV-2 by FDA under an Emergency Use Authorization (EUA). This EUA will remain in effect (meaning this test can be used) for the duration of the COVID-19 declaration under Section 564(b)(1) of the Act, 21 U.S.C. section 360bbb-3(b)(1), unless the authorization is terminated or revoked.  Performed at [facility], [address] [place], [place] [address]   Resp Panel by RT-PCR (Flu A&B, Covid) Nasopharyngeal Swab     Status: Abnormal    Collection Time: 10/05/20  8:11 AM   Specimen: Nasopharyngeal Swab; Nasopharyngeal(NP) swabs in vial transport medium  Result Value Ref Range Status   SARS Coronavirus 2 by RT PCR POSITIVE (A) NEGATIVE Final    Comment: RESULT CALLED TO, READ BACK BY AND VERIFIED WITH: ASHLEY MURRAY 10/05/20  1008 SJL (NOTE) SARS-CoV-2 target nucleic acids are DETECTED.  The SARS-CoV-2 RNA is generally detectable in upper respiratory specimens during the acute phase of infection. Positive results are indicative  of the presence of the identified virus, but do not rule out bacterial infection or co-infection with other pathogens not detected by the test. Clinical correlation with patient history and other diagnostic information is necessary to determine patient infection status. The expected result is Negative.  Fact Sheet for Patients: EntrepreneurPulse.com.au  Fact Sheet for Healthcare Providers: IncredibleEmployment.be  This test is not yet approved or cleared by the [place] FDA and  has been authorized for detection and/or diagnosis of SARS-CoV-2 by FDA under an Emergency Use Authorization (EUA).  This EUA will remain in effect (meaning this test can be  used) for the duration of  the COVID-19 declaration under Section 564(b)(1) of the Act, 21 U.S.C. section 360bbb-3(b)(1), unless the authorization is terminated or revoked sooner.     Influenza A by PCR NEGATIVE NEGATIVE Final   Influenza B by PCR NEGATIVE NEGATIVE Final    Comment: (NOTE) The Xpert Xpress SARS-CoV-2/FLU/RSV plus assay is intended as an aid in the diagnosis of influenza from Nasopharyngeal swab specimens and should not be used as a sole basis for treatment. Nasal washings and aspirates are unacceptable for Xpert Xpress SARS-CoV-2/FLU/RSV testing.  Fact Sheet for Patients: EntrepreneurPulse.com.au  Fact Sheet for Healthcare  Providers: IncredibleEmployment.be  This test is not yet approved or cleared by the [place] FDA and has been authorized for detection and/or diagnosis of SARS-CoV-2 by FDA under an Emergency Use Authorization (EUA). This EUA will remain in effect (meaning this test can be used) for the duration of the COVID-19 declaration under Section 564(b)(1) of the Act, 21 U.S.C. section 360bbb-3(b)(1), unless the authorization is terminated or revoked.  Performed at [facility], [address], [place], [place] [address]      Radiological Exams on Admission: DG Chest Portable 1 View  Result Date: 10/05/2020 CLINICAL DATA:  Shortness of breath.  Chest pain. EXAM: PORTABLE CHEST 1 VIEW COMPARISON:  10/01/2020 FINDINGS: 0825 hours. The lungs are clear without focal pneumonia, edema, pneumothorax or pleural effusion. Interstitial markings are diffusely coarsened with chronic features. Cardiopericardial silhouette is at upper limits of normal for size. The visualized bony structures of the thorax show no acute abnormality. Telemetry leads overlie the chest. IMPRESSION: No active disease. Electronically Signed   By: Misty Stanley M.D.   On: 10/05/2020 09:00     EKG: I have personally reviewed.  Sinus rhythm, QTC 534, LAD, poor R wave progression, T wave inversion in V5-V6 and a lateral leads, ST elevation in V1-V3.  Assessment/Plan Principal Problem:   NSTEMI (non-ST elevated myocardial infarction) ([place]) Active Problems:   Hypokalemia   HTN (hypertension)   HLD (hyperlipidemia)   Stroke (HCC)   Tobacco use   COVID-19 virus infection   NSTEMI (non-ST elevated myocardial infarction) [facility]): trop 144 --> 101. He has elevated trop up to 75 on previous admission 7/14. Dr. Rockey Situ of card is consulted. Pt was given 300 mg of plavix in ED. patient had 2D echo on 10/02/2020 showed EF 60 to 65% with grade 1 diastolic dysfunction.  His BNP is 130 today. - admit to progressive  unit as inpatient - IV heparin - Trend Trop - Repeat EKG in the am  - prn Nitroglycerin, Morphine, and aspirin - start lipitor 40 mg daily - Risk factor stratification: will check FLP and A1C  - check UDS  Hypokalemia: K= 3.2 on admission. - Repleted - Check Mg level  HTN (hypertension):  -IV hydralazine as needed -Oral hydralazine, lisinopril, amlodipine  HLD (hyperlipidemia) -Start Lipitor  40 mg daily  Stroke ([place]) -Aspirin, Lipitor  Tobacco use -Nicotine patch  COVID-19 virus infection: Patient is asymptomatic.  Chest x-ray negative.  Oxygen saturation 92-98% on room air. -As needed albuterol and Mucinex         DVT ppx: on IV Heparin    Code Status: Full code Family Communication: I called his wife and gave update Disposition Plan:  Anticipate discharge back to previous environment Consults called:  Dr. Rockey Situ of card Admission status and Level of care: Progressive Cardiac:     as inpt         Status is: Inpatient  Remains inpatient appropriate because:Inpatient level of care appropriate due to severity of illness  Dispo: The patient is from: Home              Anticipated d/c is to: Home              Patient currently is not medically stable to d/c.   Difficult to place patient No         Date of Service 10/05/2020    [facility]   If 7PM-7AM, please contact night-coverage [email or web address] 10/05/2020, 11:09 AM

## 2020-10-05 NOTE — Consult Note (Addendum)
ANTICOAGULATION CONSULT NOTE - Initial Consult  Pharmacy Consult for Heparin infusion Indication: chest pain/ACS  No Known Allergies  Patient Measurements: Height: 5\' 10"  (177.8 cm) Weight: 94.8 kg (209 lb) IBW/kg (Calculated) : 73 Heparin Dosing Weight: 92.4 kg   Vital Signs: Temp: 99.9 F (37.7 C) (07/18 0812) Temp Source: Oral (07/18 0812) BP: 197/72 (07/18 0930) Pulse Rate: 85 (07/18 0930)  Labs: Recent Labs    10/05/20 0811  HGB 14.9  HCT 43.7  PLT 202  CREATININE 1.02  TROPONINIHS 144*    Estimated Creatinine Clearance: 84.5 mL/min (by C-G formula based on SCr of 1.02 mg/dL).   Medical History: Past Medical History:  Diagnosis Date   Bell's palsy    Biliary colic    Hemorrhoids    Hypercholesterolemia    Hypertension    Stroke (cerebrum) (HCC)    Tobacco use     Medications:  No prior anticoagulation noted   Assessment: 65 y/o male presented with chest pain. Troponin 144. Pharmacy has been consulted for initiation and management of heparin infusion for ACS/chest pain.   Goal of Therapy:  Heparin level 0.3-0.7 units/ml Monitor platelets by anticoagulation protocol: Yes   Plan:  Give 4000 units bolus x 1 Start heparin infusion at 1500 units/hr due to recent therapeutic heparin infusion on 10/02/20 Check anti-Xa level in 6 hours and daily while on heparin Continue to monitor H&H and platelets  Dorothe Pea, PharmD, BCPS Clinical Pharmacist   10/05/2020,10:00 AM

## 2020-10-05 NOTE — Consult Note (Signed)
Cardiology Consult    Patient ID: Miguel Fletcher MRN: [id number], DOB/AGE: [id number]   Admit date: 10/05/2020 Date of Consult: 10/05/2020  Primary Physician: [facility] Primary Cardiologist: Ida Rogue, MD Requesting Provider: Mora Bellman, MD  Patient Profile    Miguel Fletcher is a 65 y.o. 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 Blaine Hamper.  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 08/2020 MRI [facility]): Well-circumscribed T1 hypointense, T2 hyperintense enhancing mass within the right axilla measuring 3.1 x 2.6 x 3.0 cm.  Mass extends along the course of the axillary neurovascular structures.  No evidence for invasion into the adjacent musculature.  No enlarged axillary lymph nodes.   Bell's palsy    Biliary colic    Hemorrhoids    Hypercholesterolemia    Hypertension    Stroke (cerebrum) (HCC)    Tobacco use     Past Surgical History:  Procedure Laterality Date   VARICOSE VEIN SURGERY Left      Allergies  No Known Allergies  History of Present Illness    65 year old male with the above past medical history including hypertension, hyperlipidemia, stroke, tobacco abuse, Bell's palsy, chronic right-sided weakness and numbness with nerve sheath tumor followed by neurosurgery at [facility].  Which led and obesity.  He has a long history of hypertension, which has been poorly controlled with reports of systolics frequently running in the 200 range.  He has previously noted that he frequently forgets to take his medicine and/or refill them.  He was recently seen in the [facility] emergency department on July 14th with complaints of right axillary pain.  ECG was relatively unremarkable however,  troponin was elevated to a peak of 75.  Echocardiogram showed an EF of 60-65% with grade 1 diastolic dysfunction.  It was felt that symptoms were most likely secondary to nerve sheath tumor with recommendation for neurosurgery follow-up and blood pressure management.  Patient was also noted to be hypokalemic during ER visit and this was supplemented.  He subsequently left AMA.  Unfortunately, over this past weekend, Miguel Fletcher began to experience intermittent fevers, chills, and malaise, beginning on Saturday, July 16, and persisting throughout the day on Sunday, July 17.  He was unaware of any prior sick contacts.  This morning, while at home he developed severe substernal chest pressure and heaviness associated with dyspnea and nausea.  He felt warm but says he did not become diaphoretic.  After about 45 minutes of ongoing symptoms, his wife contacted EMS.  Following their arrival, he was given sublingual nitroglycerin with significant improvement symptoms within a few minutes.  On arrival to the emergency department, his blood pressure on arrival was 197/72.  ECG with IVCD and LVH with repolarization abnormalities.  Initially, a code STEMI was called but this was subsequently canceled.  Troponins returned more elevated compared to previous visit at 144  101  131.  COVID screen returned positive (this was negative the other day).  He is currently chest pain-free and resting comfortably.  Inpatient Medications     atorvastatin  40 mg Oral Daily   nicotine  21 mg Transdermal Daily    Family History    Family History  Problem Relation Age of Onset  Alzheimer's disease Father    Heart disease Father    CAD Neg Hx    He indicated that his mother is deceased. He indicated that his father is deceased. He indicated that the status of his neg hx is unknown.   Social History    Social History   Socioeconomic History   Marital status: Single    Spouse name: Not on file   Number of children: Not  on file   Years of education: Not on file   Highest education level: Not on file  Occupational History   Not on file  Tobacco Use   Smoking status: Every Day    Packs/day: 2.00    Years: 25.00    Pack years: 50.00    Types: Cigarettes   Smokeless tobacco: Never  Vaping Use   Vaping Use: Never used  Substance and Sexual Activity   Alcohol use: No   Drug use: No   Sexual activity: Not on file  Other Topics Concern   Not on file  Social History Narrative   Not on file   Social Determinants of Health   Financial Resource Strain: Not on file  Food Insecurity: Not on file  Transportation Needs: Not on file  Physical Activity: Not on file  Stress: Not on file  Social Connections: Not on file  Intimate Partner Violence: Not on file     Review of Systems    General:  +++ chills, +++ fever, no night sweats or weight changes.  Cardiovascular:  +++ chest pain, +++ dyspnea, no edema, orthopnea, palpitations, paroxysmal nocturnal dyspnea. Dermatological: No rash, lesions/masses Respiratory: +++ cough, +++ dyspnea Urologic: No hematuria, dysuria Abdominal:   +++ nausea, vomiting, diarrhea, bright red blood per rectum, melena, or hematemesis Neurologic:  No visual changes, wkns, changes in mental status. All other systems reviewed and are otherwise negative except as noted above.  Physical Exam    Blood pressure (!) 156/86, pulse 92, temperature (!) 100.4 F (38 C), temperature source Oral, resp. rate 18, height 5\' 10"  (1.778 m), weight 94.8 kg, SpO2 97 %.  General: Pleasant, NAD Psych: Normal affect. Neuro: Alert and oriented X 3. Moves all extremities spontaneously. HEENT: Normal  Neck: Supple without bruits or JVD. Lungs:  Resp regular and unlabored, CTA. Heart: RRR no s3, s4.  2/6 syst murmur heard throughout. Abdomen: Obese, soft, non-tender, non-distended, BS + x 4.  Extremities: No clubbing, cyanosis or edema. DP/PT2+, Radials 2+ and equal bilaterally.  Labs     Cardiac Enzymes Recent Labs  Lab 10/01/20 1203 10/01/20 1411 10/05/20 0811 10/05/20 1011 10/05/20 1332  TROPONINIHS 66* 75* 144* 101* 131*      Lab Results  Component Value Date   WBC 6.3 10/05/2020   HGB 14.9 10/05/2020   HCT 43.7 10/05/2020   MCV 92.4 10/05/2020   PLT 202 10/05/2020    Recent Labs  Lab 10/05/20 0811  NA 139  K 3.2*  CL 104  CO2 27  BUN 13  CREATININE 1.02  CALCIUM 9.0  PROT 7.1  BILITOT 1.1  ALKPHOS 55  ALT 20  AST 26  GLUCOSE 98     Radiology Studies    DG Chest 2 View  Result Date: 10/01/2020 CLINICAL DATA:  65 year old male with history of chest pain and facial droop. Right-sided weakness. EXAM: CHEST - 2 VIEW COMPARISON:  Chest x-ray 07/01/2019. FINDINGS: Lung volumes are normal. No consolidative airspace disease. No pleural effusions. No pneumothorax. No pulmonary nodule or mass noted. Pulmonary  vasculature and the cardiomediastinal silhouette are within normal limits. IMPRESSION: No radiographic evidence of acute cardiopulmonary disease. Electronically Signed   By: Vinnie Langton M.D.   On: 10/01/2020 12:46   CT HEAD WO CONTRAST  Result Date: 10/01/2020 CLINICAL DATA:  Neurologic deficit. Right-sided weakness in face. History of Bell's palsy. Chest pain. EXAM: CT HEAD WITHOUT CONTRAST TECHNIQUE: Contiguous axial images were obtained from the base of the skull through the vertex without intravenous contrast. COMPARISON:  04/22/2011 FINDINGS: Brain: No evidence of acute infarction, hemorrhage, hydrocephalus, extra-axial collection or mass lesion/mass effect. There is mild diffuse low-attenuation within the subcortical and periventricular white matter compatible with chronic microvascular disease. Vascular: No hyperdense vessel or unexpected calcification. Skull: Normal. Negative for fracture or focal lesion. Sinuses/Orbits: No acute finding. Other: None IMPRESSION: 1. No acute intracranial abnormalities. 2. Mild chronic small vessel ischemic  change. Electronically Signed   By: Kerby Moors M.D.   On: 10/01/2020 16:36   DG Chest Portable 1 View  Result Date: 10/05/2020 CLINICAL DATA:  Shortness of breath.  Chest pain. EXAM: PORTABLE CHEST 1 VIEW COMPARISON:  10/01/2020 FINDINGS: 0825 hours. The lungs are clear without focal pneumonia, edema, pneumothorax or pleural effusion. Interstitial markings are diffusely coarsened with chronic features. Cardiopericardial silhouette is at upper limits of normal for size. The visualized bony structures of the thorax show no acute abnormality. Telemetry leads overlie the chest. IMPRESSION: No active disease. Electronically Signed   By: Misty Stanley M.D.   On: 10/05/2020 09:00   CT ANGIO CHEST AORTA W/CM & OR WO/CM  Result Date: 10/01/2020 CLINICAL DATA:  Chest pain and back pain. Aortic dissection suspected. EXAM: CT ANGIOGRAPHY CHEST WITH CONTRAST TECHNIQUE: Multidetector CT imaging of the chest was performed using the standard protocol during bolus administration of intravenous contrast. Multiplanar CT image reconstructions and MIPs were obtained to evaluate the vascular anatomy. CONTRAST:  38mL OMNIPAQUE IOHEXOL 350 MG/ML SOLN COMPARISON:  Chest radiography same day. FINDINGS: Cardiovascular: Mild cardiomegaly. Coronary artery calcification is present. Aortic atherosclerotic calcification is present. No evidence of dissection or aneurysmal dilatation. Small amount of fluid in the superior pericardial recess. Pulmonary arterial branches appear normal. Mediastinum/Nodes: No mass or lymphadenopathy. Lungs/Pleura: No pleural effusion. No pulmonary edema. No infiltrate or collapse. No mass or nodule. Upper Abdomen: No upper abdominal finding of significance. Musculoskeletal: Chronic degenerative spondylosis and bridging osteophytes. Review of the MIP images confirms the above findings. IMPRESSION: No acute chest pathology by CT.  No aortic aneurysm or dissection. Aortic Atherosclerosis (ICD10-I70.0). Coronary  artery calcification. Electronically Signed   By: Nelson Chimes M.D.   On: 10/01/2020 14:36   ECG & Cardiac Imaging    RSR, 76, LAD, IVCD, LVH w/ repolarization abnormalities - personally reviewed.  Assessment & Plan    1.  Chest pain/non-STEMI: Patient without prior history of CAD but with with risk factors including hypertension, hyperlipidemia, stroke, obesity, and tobacco abuse.  He was seen in the emergency department on July 14 with atypical chest/right axillary pain and troponin elevation to 75.  He was markedly hypertensive at the time.  He left AMA.  He developed flulike symptoms beginning Saturday, July 16 and this morning, experience substernal chest pressure associated with dyspnea and nausea.  Symptoms resolved following administration of sublingual nitroglycerin by EMS after almost an hour of duration.  In the ED, he was once again markedly hypertensive at 197/72.  ECG with IVCD, LVH, and repolarization abnormalities.  Troponin is higher than at last presentation, initially at 144  101  131.  He is currently chest pain-free.  COVID PCR has returned positive.  Symptoms are concerning for angina in a gentleman with multiple risk factors as well as coronary calcification noted on CT on July 14.  We will plan on diagnostic catheterization this admission, and likely tomorrow.  The patient understands that risks include but are not limited to stroke (1 in 1000), death (1 in 72), kidney failure [usually temporary] (1 in 500), bleeding (1 in 200), allergic reaction [possibly serious] (1 in 200), and agrees to proceed.  Continue heparin, statin.  I will add aspirin and beta-blocker.  Patient was given a 300 mg load of clopidogrel.  We will hold off on additional clopidogrel at this time.  2.  Essential hypertension: Poorly controlled.  He says he has been taking lisinopril and amlodipine since his last hospital visit last week.  Resume.  Add beta-blocker in the setting of above.  Follow and titrate  as necessary.  3.  Hyperlipidemia: Continue high potency statin therapy in the setting of ACS.  4.  Tobacco abuse: Says he has not smoked any cigarettes in the past 4 days.  Complete cessation advised.  5.  COVID-19 infection: Patient with development of symptoms including fevers, chills, malaise, and cough beginning Saturday, July 16.  He is COVID-positive this morning.  Management per internal medicine.  6.  Right axillary mass/nerve sheath tumor: Patient to follow-up with neurosurgery at [facility].  7.  Hypokalemia: Potassium 3.2 this morning.  Supplementation has been provided.  Signed, Murray Hodgkins, NP 10/05/2020, 2:56 PM  For questions or updates, please contact   Please consult [email or web address] for contact info under Cardiology/STEMI.

## 2020-10-05 NOTE — ED Provider Notes (Signed)
[facility] Emergency Department Provider Note  ____________________________________________   Event Date/Time   First MD Initiated Contact with Patient 10/05/20 [phone]     (approximate)  I have reviewed the triage vital signs and the nursing notes.   HISTORY  Chief Complaint Chest Pain    HPI Miguel Fletcher is a 65 y.o. male with HTN, HLD, bells palsy with R sided facial droop who comes in with chest pain.  Cardiology though non cardiac in nature. Trop went from 66-->75.  Pt started on famotidine and ended up leaving hospital.  Patient reports that his pain started at 6 AM, woke him up from sleep centralized, nonradiating, nothing makes it better or worse.  No severe shortness of breath.  Patient was given aspirin and nitro spray.  Patient feeling slightly nauseous.  Denies any abdominal pain.  Does report having a right axillary mass with some chronic tingling in that arm that is being worked up outpatient but this pain feels different.   Can not see EKGS from 4 days ago but report from ED note says normal sinus and does not comment on LBBB.           Past Medical History:  Diagnosis Date   Bell's palsy    Biliary colic    Hemorrhoids    Hypercholesterolemia    Hypertension    Stroke (cerebrum) ([facility])    Tobacco use     Patient Active Problem List   Diagnosis Date Noted   Hypokalemia 10/01/2020   Essential hypertension 10/01/2020   Axillary mass, right 10/01/2020   Chest pain 03/19/2016    Past Surgical History:  Procedure Laterality Date   VARICOSE VEIN SURGERY Left     Prior to Admission medications   Medication Sig Start Date End Date Taking? Authorizing Provider  amLODipine (NORVASC) 10 MG tablet Take 10 mg by mouth every evening.    [provider]  aspirin EC 81 MG tablet Take 81 mg by mouth daily. 08/04/14   [provider]  hydrALAZINE (APRESOLINE) 100 MG tablet Take 1 tablet (100 mg total) by mouth 3  (three) times daily. 10/02/20   Sharen Hones, MD  lisinopril (ZESTRIL) 40 MG tablet Take 1 tablet (40 mg total) by mouth daily. 10/03/20   Sharen Hones, MD  potassium chloride (KLOR-CON) 10 MEQ tablet Take 4 tablets (40 mEq total) by mouth daily for 4 days. 10/02/20 10/06/20  Sharen Hones, MD    Allergies Patient has no known allergies.  Family History  Problem Relation Age of Onset   Alzheimer's disease Father    Heart disease Father    CAD Neg Hx     Social History Social History   Tobacco Use   Smoking status: Every Day    Packs/day: 2.00    Years: 25.00    Pack years: 50.00    Types: Cigarettes   Smokeless tobacco: Never  Vaping Use   Vaping Use: Never used  Substance Use Topics   Alcohol use: No   Drug use: No      Review of Systems Constitutional: No fever/chills Eyes: No visual changes. ENT: No sore throat. Cardiovascular: Positive chest pain Respiratory: Denies shortness of breath. Gastrointestinal: No abdominal pain.  No nausea, no vomiting.  No diarrhea.  No constipation. Genitourinary: Negative for dysuria. Musculoskeletal: Negative for back pain. Skin: Negative for rash. Neurological: Negative for headaches, focal weakness or numbness. All other ROS negative ____________________________________________   PHYSICAL EXAM:  VITAL SIGNS: Blood pressure Marland Kitchen)  197/72, pulse 85, temperature 99.9 F (37.7 C), temperature source Oral, resp. rate (!) 21, height 5\' 10"  (1.778 m), weight 94.8 kg, SpO2 92 %.   Constitutional: Alert and oriented. Appears uncomfortable  Eyes: Conjunctivae are normal. EOMI. Head: Atraumatic. Nose: No congestion/rhinnorhea. Mouth/Throat: Mucous membranes are moist.   Neck: No stridor. Trachea Midline. FROM Cardiovascular: Normal rate, regular rhythm. Grossly normal heart sounds.  Good peripheral circulation. Respiratory: Normal respiratory effort.  No retractions. Lungs CTAB. Gastrointestinal: Soft and nontender. No distention. No  abdominal bruits.  Musculoskeletal: No lower extremity tenderness nor edema.  No joint effusions. Mass under R armpit  Neurologic:  Normal speech and language. No gross focal neurologic deficits are appreciated.  Skin:  Skin is warm, dry and intact. No rash noted. Psychiatric: Mood and affect are normal. Speech and behavior are normal. GU: Deferred   ____________________________________________   LABS (all labs ordered are listed, but only abnormal results are displayed)  Labs Reviewed  RESP PANEL BY RT-PCR (FLU A&B, COVID) ARPGX2 - Abnormal; Notable for the following components:      Result Value   SARS Coronavirus 2 by RT PCR POSITIVE (*)    All other components within normal limits  BASIC METABOLIC PANEL - Abnormal; Notable for the following components:   Potassium 3.2 (*)    All other components within normal limits  BRAIN NATRIURETIC PEPTIDE - Abnormal; Notable for the following components:   B Natriuretic Peptide 130.9 (*)    All other components within normal limits  TROPONIN I (HIGH SENSITIVITY) - Abnormal; Notable for the following components:   Troponin I (High Sensitivity) 144 (*)    All other components within normal limits  CBC  HEPATIC FUNCTION PANEL  LIPASE, BLOOD  APTT  PROTIME-INR  HEPARIN LEVEL (UNFRACTIONATED)  TROPONIN I (HIGH SENSITIVITY)   ____________________________________________   ED ECG REPORT I, [name], the attending physician, personally viewed and interpreted this ECG.  Sinus rate of 78, LBBB with out scarbosa criteria, TWI in V5/V6, AVL   Sinus rate of 83, no st elevation, twi in avl, v5 v6, LBBB  ____________________________________________  RADIOLOGY Robert Bellow, personally viewed and evaluated these images (plain radiographs) as part of my medical decision making, as well as reviewing the written report by the radiologist.  ED MD interpretation:  no PNA   Official radiology report(s): DG Chest Portable 1 View  Result  Date: 10/05/2020 CLINICAL DATA:  Shortness of breath.  Chest pain. EXAM: PORTABLE CHEST 1 VIEW COMPARISON:  10/01/2020 FINDINGS: 0825 hours. The lungs are clear without focal pneumonia, edema, pneumothorax or pleural effusion. Interstitial markings are diffusely coarsened with chronic features. Cardiopericardial silhouette is at upper limits of normal for size. The visualized bony structures of the thorax show no acute abnormality. Telemetry leads overlie the chest. IMPRESSION: No active disease. Electronically Signed   By: Misty Stanley M.D.   On: 10/05/2020 09:00    ____________________________________________   PROCEDURES  Procedure(s) performed (including Critical Care):  .1-3 Lead EKG Interpretation  Date/Time: 10/05/2020 10:17 AM Performed by: Vanessa [place], MD Authorized by: Vanessa [place], MD     Interpretation: normal     ECG rate:  80s   ECG rate assessment: normal     Rhythm: sinus rhythm     Ectopy: none     Conduction: normal   .Critical Care  Date/Time: 10/05/2020 10:17 AM Performed by: Vanessa [place], MD Authorized by: Vanessa [place], MD   Critical care provider statement:  Critical care time (minutes):  45   Critical care was necessary to treat or prevent imminent or life-threatening deterioration of the following conditions:  Cardiac failure   Critical care was time spent personally by me on the following activities:  Discussions with consultants, evaluation of patient's response to treatment, examination of patient, ordering and performing treatments and interventions, ordering and review of laboratory studies, ordering and review of radiographic studies, pulse oximetry, re-evaluation of patient's condition, obtaining history from patient or surrogate and review of old charts   ____________________________________________   INITIAL IMPRESSION / [place] / ED COURSE   RUI WORDELL was evaluated in Emergency Department on 10/05/2020 for the symptoms  described in the history of present illness. He was evaluated in the context of the global COVID-19 pandemic, which necessitated consideration that the patient might be at risk for infection with the SARS-CoV-2 virus that causes COVID-19. Institutional protocols and algorithms that pertain to the evaluation of patients at risk for COVID-19 are in a state of rapid change based on information released by regulatory bodies including the CDC and federal and state organizations. These policies and algorithms were followed during the patient's care in the ED.    Most Likely DDx:  -concerning for ACS    DDx that was also considered d/t potential to cause harm, but was found less likely based on history and physical (as detailed above): -PNA (no fevers, cough but CXR to evaluate) -PNX (reassured with equal b/l breath sounds, CXR to evaluate) -Symptomatic anemia (will get H&H) -Pulmonary embolism as no sob at rest, not pleuritic in nature, no hypoxia -Aortic Dissection as no tearing pain and no radiation to the mid back, pulses equal -Pericarditis no rub on exam, EKG changes or hx to suggest dx -Tamponade (no notable SOB, tachycardic, hypotensive) -Esophageal rupture (no h/o diffuse vomitting/no crepitus)  EKGs are concerning for new left bundle branch block with T wave version in V5 and V6 and ST elevation in aVR.  Although we were able to see an EKG from 2 years ago he did have an T wave inversion in V6.  I did talk with the STEMI doctor Dr. Clayborn Bigness who came down and evaluated the patient.  Patient ended up being COVID-positive as well.  At this time we will hold off on catheterization he recommended heparin, Plavix load and will write a note.  He recommended I talk to the on-call cardiologist Dr. Rockey Situ but at this time we will hold off on catheterization and continue to trend troponins.  We will discussed the hospital team for admission        ____________________________________________   FINAL CLINICAL IMPRESSION(S) / ED DIAGNOSES   Final diagnoses:  NSTEMI (non-ST elevated myocardial infarction) ([place])  COVID-19     MEDICATIONS GIVEN DURING THIS VISIT:  Medications  nitroGLYCERIN (NITROSTAT) SL tablet 0.4 mg (has no administration in time range)  heparin bolus via infusion 4,000 Units (has no administration in time range)    Followed by  heparin ADULT infusion 100 units/mL (25000 units/239mL) (has no administration in time range)  clopidogrel (PLAVIX) tablet 300 mg (has no administration in time range)  HYDROmorphone (DILAUDID) injection 0.5 mg (0.5 mg Intravenous Given 10/05/20 0850)  ondansetron (ZOFRAN) injection 4 mg (4 mg Intravenous Given 10/05/20 0850)  pantoprazole (PROTONIX) injection 40 mg (40 mg Intravenous Given 10/05/20 0848)  alum & mag hydroxide-simeth (MAALOX/MYLANTA) 200-200-20 MG/5ML suspension 30 mL (30 mLs Oral Given 10/05/20 0847)     ED Discharge Orders  None        Note:  This document was prepared using Dragon voice recognition software and may include unintentional dictation errors.    Vanessa [place], MD 10/05/20 1020

## 2020-10-05 NOTE — ED Notes (Signed)
Joneen Caraway RN aware of assigned bed

## 2020-10-06 ENCOUNTER — Encounter
Admission: EM | Disposition: A | Discharge status: Home / Self Care | Payer: Self-pay | Source: Home / Self Care | Attending: Internal Medicine

## 2020-10-06 DIAGNOSIS — R072 Precordial pain: Secondary | ICD-10-CM

## 2020-10-06 DIAGNOSIS — I2510 Atherosclerotic heart disease of native coronary artery without angina pectoris: Secondary | ICD-10-CM

## 2020-10-06 HISTORY — PX: INTRAVASCULAR ULTRASOUND/IVUS: CATH118244

## 2020-10-06 HISTORY — PX: LEFT HEART CATH AND CORONARY ANGIOGRAPHY: CATH118249

## 2020-10-06 LAB — LIPID PANEL
Cholesterol: 118 mg/dL (ref 0–200)
HDL: 31 mg/dL — ABNORMAL LOW (ref >40)
LDL Cholesterol: 78 mg/dL (ref 0–99)
Total CHOL/HDL Ratio: 3.8 RATIO
Triglycerides: 45 mg/dL (ref <150)
VLDL: 9 mg/dL (ref 0–40)

## 2020-10-06 LAB — BASIC METABOLIC PANEL
Anion gap: 7 (ref 5–15)
BUN: 15 mg/dL (ref 8–23)
CO2: 25 mmol/L (ref 22–32)
Calcium: 8.7 mg/dL — ABNORMAL LOW (ref 8.9–10.3)
Chloride: 106 mmol/L (ref 98–111)
Creatinine, Ser: 0.96 mg/dL (ref 0.61–1.24)
GFR, Estimated: >60 mL/min (ref >60)
Glucose, Bld: 95 mg/dL (ref 70–99)
Potassium: 3.7 mmol/L (ref 3.5–5.1)
Sodium: 138 mmol/L (ref 135–145)

## 2020-10-06 LAB — URINE DRUG SCREEN, QUALITATIVE (ARMC ONLY)
Amphetamines, Ur Screen: NOT DETECTED
Barbiturates, Ur Screen: NOT DETECTED
Benzodiazepine, Ur Scrn: NOT DETECTED
Cannabinoid 50 Ng, Ur ~~LOC~~: NOT DETECTED
Cocaine Metabolite,Ur ~~LOC~~: NOT DETECTED
MDMA (Ecstasy)Ur Screen: NOT DETECTED
Methadone Scn, Ur: NOT DETECTED
Opiate, Ur Screen: NOT DETECTED
Phencyclidine (PCP) Ur S: NOT DETECTED
Tricyclic, Ur Screen: POSITIVE — AB

## 2020-10-06 LAB — CBC
HCT: 42.3 % (ref 39.0–52.0)
Hemoglobin: 14.3 g/dL (ref 13.0–17.0)
MCH: 31 pg (ref 26.0–34.0)
MCHC: 33.8 g/dL (ref 30.0–36.0)
MCV: 91.8 fL (ref 80.0–100.0)
Platelets: 165 10*3/uL (ref 150–400)
RBC: 4.61 MIL/uL (ref 4.22–5.81)
RDW: 13.8 % (ref 11.5–15.5)
WBC: 4.4 10*3/uL (ref 4.0–10.5)
nRBC: 0 % (ref 0.0–0.2)

## 2020-10-06 LAB — HEPARIN LEVEL (UNFRACTIONATED)
Heparin Unfractionated: 0.43 IU/mL (ref 0.30–0.70)
Heparin Unfractionated: 0.38 IU/mL (ref 0.30–0.70)

## 2020-10-06 LAB — POCT ACTIVATED CLOTTING TIME: Activated Clotting Time: 341 seconds

## 2020-10-06 SURGERY — LEFT HEART CATH AND CORONARY ANGIOGRAPHY
Anesthesia: Moderate Sedation

## 2020-10-06 MED ORDER — SODIUM CHLORIDE 0.9 % WEIGHT BASED INFUSION: 3.0000 mL/kg/h | INTRAVENOUS | Status: DC

## 2020-10-06 MED ORDER — POTASSIUM CHLORIDE 20 MEQ PO PACK
40.0000 meq | PACK | Freq: Once | ORAL | Status: AC
  Administered 2020-10-06: 40 meq via ORAL
  Filled 2020-10-06: qty 2

## 2020-10-06 MED ORDER — VERAPAMIL HCL 2.5 MG/ML IV SOLN
INTRAVENOUS | Status: AC
  Filled 2020-10-06: qty 2

## 2020-10-06 MED ORDER — HYDRALAZINE HCL 20 MG/ML IJ SOLN
10.0000 mg | INTRAMUSCULAR | Status: AC | PRN
  Administered 2020-10-06: 10 mg via INTRAVENOUS

## 2020-10-06 MED ORDER — SODIUM CHLORIDE 0.9 % WEIGHT BASED INFUSION
1.0000 mL/kg/h | INTRAVENOUS | Status: DC
Start: 2020-10-07 — End: 2020-10-06

## 2020-10-06 MED ORDER — SODIUM CHLORIDE 0.9 % IV SOLN: INTRAVENOUS | Status: AC

## 2020-10-06 MED ORDER — LABETALOL HCL 5 MG/ML IV SOLN
INTRAVENOUS | Status: DC | PRN
  Administered 2020-10-06 (×2): 10 mg via INTRAVENOUS

## 2020-10-06 MED ORDER — ASPIRIN 81 MG PO CHEW: 81.0000 mg | CHEWABLE_TABLET | ORAL | Status: DC

## 2020-10-06 MED ORDER — HYDRALAZINE HCL 20 MG/ML IJ SOLN
10.0000 mg | Freq: Four times a day (QID) | INTRAMUSCULAR | Status: DC | PRN
  Administered 2020-10-07: 10 mg via INTRAVENOUS
  Filled 2020-10-06: qty 1

## 2020-10-06 MED ORDER — NITROGLYCERIN 1 MG/10 ML FOR IR/CATH LAB
INTRA_ARTERIAL | Status: DC | PRN
  Administered 2020-10-06: 200 ug via INTRACORONARY

## 2020-10-06 MED ORDER — LABETALOL HCL 5 MG/ML IV SOLN: 10.0000 mg | INTRAVENOUS | Status: AC | PRN

## 2020-10-06 MED ORDER — MIDAZOLAM HCL 2 MG/2ML IJ SOLN
INTRAMUSCULAR | Status: DC | PRN
  Administered 2020-10-06: 2 mg via INTRAVENOUS

## 2020-10-06 MED ORDER — HEPARIN SODIUM (PORCINE) 1000 UNIT/ML IJ SOLN
INTRAMUSCULAR | Status: AC
  Filled 2020-10-06: qty 1

## 2020-10-06 MED ORDER — HEPARIN (PORCINE) IN NACL 1000-0.9 UT/500ML-% IV SOLN
INTRAVENOUS | Status: AC
  Filled 2020-10-06: qty 1000

## 2020-10-06 MED ORDER — LABETALOL HCL 5 MG/ML IV SOLN
INTRAVENOUS | Status: AC
  Filled 2020-10-06: qty 4

## 2020-10-06 MED ORDER — LIDOCAINE HCL (PF) 1 % IJ SOLN
INTRAMUSCULAR | Status: AC
  Filled 2020-10-06: qty 30

## 2020-10-06 MED ORDER — SODIUM CHLORIDE 0.9 % IV SOLN: 250.0000 mL | INTRAVENOUS | Status: DC | PRN

## 2020-10-06 MED ORDER — MIDAZOLAM HCL 2 MG/2ML IJ SOLN
INTRAMUSCULAR | Status: AC
  Filled 2020-10-06: qty 2

## 2020-10-06 MED ORDER — VERAPAMIL HCL 2.5 MG/ML IV SOLN
INTRAVENOUS | Status: DC | PRN
  Administered 2020-10-06: 10 mL via INTRA_ARTERIAL

## 2020-10-06 MED ORDER — HEPARIN SODIUM (PORCINE) 1000 UNIT/ML IJ SOLN
INTRAMUSCULAR | Status: DC | PRN
  Administered 2020-10-06: 4000 [IU] via INTRAVENOUS
  Administered 2020-10-06: 5000 [IU] via INTRAVENOUS

## 2020-10-06 MED ORDER — IOHEXOL 300 MG/ML  SOLN
INTRAMUSCULAR | Status: DC | PRN
Start: 2020-10-06 — End: 2020-10-06
  Administered 2020-10-06: 120 mL via INTRA_ARTERIAL

## 2020-10-06 MED ORDER — ACETAMINOPHEN 325 MG PO TABS
650.0000 mg | ORAL_TABLET | Freq: Once | ORAL | Status: AC
  Administered 2020-10-06: 650 mg via ORAL
  Filled 2020-10-06: qty 2

## 2020-10-06 MED ORDER — HYDRALAZINE HCL 20 MG/ML IJ SOLN
INTRAMUSCULAR | Status: AC
  Filled 2020-10-06: qty 1

## 2020-10-06 MED ORDER — FENTANYL CITRATE (PF) 100 MCG/2ML IJ SOLN
INTRAMUSCULAR | Status: DC | PRN
  Administered 2020-10-06: 25 ug via INTRAVENOUS

## 2020-10-06 MED ORDER — HYDRALAZINE HCL 20 MG/ML IJ SOLN
INTRAMUSCULAR | Status: DC | PRN
  Administered 2020-10-06 (×2): 10 mg via INTRAVENOUS

## 2020-10-06 MED ORDER — HEPARIN (PORCINE) IN NACL 1000-0.9 UT/500ML-% IV SOLN
INTRAVENOUS | Status: DC | PRN
  Administered 2020-10-06 (×2): 500 mL

## 2020-10-06 MED ORDER — SODIUM CHLORIDE 0.9% FLUSH: 3.0000 mL | INTRAVENOUS | Status: DC | PRN

## 2020-10-06 MED ORDER — TRAZODONE HCL 50 MG PO TABS
25.0000 mg | ORAL_TABLET | Freq: Every evening | ORAL | Status: DC | PRN
Start: 2020-10-06 — End: 2020-10-07
  Administered 2020-10-06: 25 mg via ORAL
  Filled 2020-10-06: qty 1

## 2020-10-06 MED ORDER — ONDANSETRON HCL 4 MG/2ML IJ SOLN: 4.0000 mg | Freq: Four times a day (QID) | INTRAMUSCULAR | Status: DC | PRN

## 2020-10-06 MED ORDER — FENTANYL CITRATE (PF) 100 MCG/2ML IJ SOLN
INTRAMUSCULAR | Status: AC
  Filled 2020-10-06: qty 2

## 2020-10-06 MED ORDER — SODIUM CHLORIDE 0.9% FLUSH
3.0000 mL | Freq: Two times a day (BID) | INTRAVENOUS | Status: DC
  Administered 2020-10-06: 3 mL via INTRAVENOUS

## 2020-10-06 MED ORDER — LIDOCAINE HCL (PF) 1 % IJ SOLN
INTRAMUSCULAR | Status: DC | PRN
  Administered 2020-10-06: 2 mL

## 2020-10-06 MED ORDER — VERAPAMIL HCL 2.5 MG/ML IV SOLN: INTRAVENOUS | Status: DC | PRN

## 2020-10-06 MED ORDER — HEPARIN SODIUM (PORCINE) 5000 UNIT/ML IJ SOLN
5000.0000 [IU] | Freq: Three times a day (TID) | INTRAMUSCULAR | Status: DC
  Administered 2020-10-07: 5000 [IU] via SUBCUTANEOUS
  Filled 2020-10-06: qty 1

## 2020-10-06 MED ORDER — ISOSORBIDE MONONITRATE ER 30 MG PO TB24
30.0000 mg | ORAL_TABLET | Freq: Every day | ORAL | Status: DC
  Administered 2020-10-07: 30 mg via ORAL
  Filled 2020-10-06: qty 1

## 2020-10-06 MED ORDER — HYDRALAZINE HCL 20 MG/ML IJ SOLN
INTRAMUSCULAR | Status: AC
  Administered 2020-10-06: 10 mg via INTRAVENOUS
  Filled 2020-10-06: qty 1

## 2020-10-06 SURGICAL SUPPLY — 15 items
CATH EAGLE EYE PLAT IMAGING (CATHETERS) ×2 IMPLANT
CATH INFINITI 5 FR JL3.5 (CATHETERS) ×2 IMPLANT
CATH INFINITI 5FR JL4 (CATHETERS) ×2 IMPLANT
CATH INFINITI JR4 5F (CATHETERS) ×2 IMPLANT
CATH LAUNCHER 6FR EBU3.5 (CATHETERS) ×2 IMPLANT
DEVICE RAD TR BAND REGULAR (VASCULAR PRODUCTS) ×2 IMPLANT
GLIDESHEATH SLEND SS 6F .021 (SHEATH) ×2 IMPLANT
GUIDEWIRE INQWIRE 1.5J.035X260 (WIRE) ×1 IMPLANT
INQWIRE 1.5J .035X260CM (WIRE) ×2
KIT ENCORE 26 ADVANTAGE (KITS) ×2 IMPLANT
PACK CARDIAC CATH (CUSTOM PROCEDURE TRAY) ×2 IMPLANT
PROTECTION STATION PRESSURIZED (MISCELLANEOUS) ×2
SET ATX SIMPLICITY (MISCELLANEOUS) ×2 IMPLANT
STATION PROTECTION PRESSURIZED (MISCELLANEOUS) ×1 IMPLANT
WIRE RUNTHROUGH .014X180CM (WIRE) ×2 IMPLANT

## 2020-10-06 NOTE — Consult Note (Signed)
ANTICOAGULATION CONSULT NOTE - Initial Consult  Pharmacy Consult for Heparin infusion Indication: chest pain/ACS  No Known Allergies  Patient Measurements: Height: 5\' 10"  (177.8 cm) Weight: 94.8 kg (209 lb) IBW/kg (Calculated) : 73 Heparin Dosing Weight: 92.4 kg   Vital Signs: Temp: 98.5 F (36.9 C) (07/19 0410) Temp Source: Oral (07/19 0410) BP: 154/66 (07/19 0410) Pulse Rate: 54 (07/19 0410)  Labs: Recent Labs    10/05/20 0811 10/05/20 1011 10/05/20 1332 10/05/20 1636 10/05/20 1950 10/06/20 0041 10/06/20 0646  HGB 14.9  --   --   --   --  14.3  --   HCT 43.7  --   --   --   --  42.3  --   PLT 202  --   --   --   --  165  --   APTT 29  --   --   --   --   --   --   LABPROT 13.0  --   --   --   --   --   --   INR 1.0  --   --   --   --   --   --   HEPARINUNFRC  --   --   --  0.28*  --  0.43 0.38  CREATININE 1.02  --   --   --   --   --   --   TROPONINIHS 144*   < > 131* 208* 257*  --   --    < > = values in this interval not displayed.     Estimated Creatinine Clearance: 84.5 mL/min (by C-G formula based on SCr of 1.02 mg/dL).   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 08/2020 MRI [facility]): Well-circumscribed T1 hypointense, T2 hyperintense enhancing mass within the right axilla measuring 3.1 x 2.6 x 3.0 cm.  Mass extends along the course of the axillary neurovascular structures.  No evidence for invasion into the adjacent musculature.  No enlarged axillary lymph nodes.   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 Troponin 131>257. Pharmacy has been consulted for initiation and  management of heparin infusion for ACS/chest pain.   Date/Time HL Comment 7/18 @1636  0.28 Subtherapeutic, 1500 u/hr - confirmed with nurse no issues with heparin infusion or line 7/19 @ 0041  0.43 Therapeutic x1; 1700 un/hr 7/19 @0646     0.38 Therapeutic x2; 1700 un/hr    Goal of Therapy:  Heparin level 0.3-0.7 units/ml Monitor platelets by anticoagulation protocol: Yes   Plan:  Heparin level consecutively therapeutic. Continue current rate and check level with AM labs. CTM CBC daily while on heparin.   Lorna Dibble, PharmD, [facility] Clinical Pharmacist   10/06/2020,7:23 AM

## 2020-10-06 NOTE — Interval H&P Note (Signed)
Cath Lab Visit (complete for each Cath Lab visit)  Clinical Evaluation Leading to the Procedure:   ACS: Yes.    Non-ACS:    Anginal Classification: CCS IV  Anti-ischemic medical therapy: Maximal Therapy (2 or more classes of medications)  Non-Invasive Test Results: No non-invasive testing performed  Prior CABG: No previous CABG      History and Physical Interval Note:  10/06/2020 4:12 PM  Miguel Fletcher  has presented today for surgery, with the diagnosis of nstemi.  The various methods of treatment have been discussed with the patient and family. After consideration of risks, benefits and other options for treatment, the patient has consented to  Procedure(s): LEFT HEART CATH AND CORONARY ANGIOGRAPHY (N/A) as a surgical intervention.  The patient's history has been reviewed, patient examined, no change in status, stable for surgery.  I have reviewed the patient's chart and labs.  Questions were answered to the patient's satisfaction.     Sherren Mocha

## 2020-10-06 NOTE — Progress Notes (Addendum)
Progress Note  Patient Name: Miguel Fletcher Date of Encounter: 10/06/2020  Primary Cardiologist: Ida Rogue, MD  Subjective   "Feel great!"  No chest pain or sob.  Eager for cath and discharge.  Inpatient Medications    Scheduled Meds:  amLODipine  10 mg Oral QPM   aspirin  81 mg Oral Daily   atorvastatin  40 mg Oral Daily   carvedilol  3.125 mg Oral BID WC   hydrALAZINE  100 mg Oral TID   lisinopril  40 mg Oral Daily   nicotine  21 mg Transdermal Daily   sodium chloride flush  3 mL Intravenous Q12H   Continuous Infusions:  heparin 1,700 Units/hr (10/05/20 2244)   PRN Meds: acetaminophen, albuterol, dextromethorphan-guaiFENesin, hydrALAZINE, hydrOXYzine, morphine injection, nitroGLYCERIN   Vital Signs    Vitals:   10/05/20 2235 10/06/20 0020 10/06/20 0410 10/06/20 0739  BP: (!) 160/65 (!) 172/59 (!) 154/66 (!) 177/67  Pulse: 92 91 (!) 54   Resp:  11 18   Temp: 100.3 F (37.9 C) 100 F (37.8 C) 98.5 F (36.9 C) 98.7 F (37.1 C)  TempSrc: Oral Oral Oral Oral  SpO2: 97% 98% 96% 98%  Weight:      Height:        Intake/Output Summary (Last 24 hours) at 10/06/2020 1043 Last data filed at 10/06/2020 0758 Gross per 24 hour  Intake 700.82 ml  Output 1050 ml  Net -349.18 ml   Filed Weights   10/05/20 0813  Weight: 94.8 kg    Physical Exam   GEN: Well nourished, well developed, in no acute distress.  HEENT: Grossly normal.  Neck: Supple, no JVD, carotid bruits, or masses. Cardiac: RRR, 2/6 syst murmur throughout, no rubs, or gallops. No clubbing, cyanosis, edema.  Radials 2+, DP/PT 2+ and equal bilaterally.  Respiratory:  Respirations regular and unlabored, scattered rhonchi throughout. GI: Soft, nontender, nondistended, BS + x 4. MS: no deformity or atrophy. Skin: warm and dry, no rash. Neuro:  Strength and sensation are intact. Psych: AAOx3.  Normal affect.  Labs    Chemistry Recent Labs  Lab 10/01/20 1203 10/01/20 1847 10/02/20 0536  10/05/20 0811 10/06/20 0646  NA 139  --  140 139 138  K 2.7*   < > 2.8* 3.2* 3.7  CL 103  --  107 104 106  CO2 27  --  26 27 25   GLUCOSE 87  --  90 98 95  BUN 14  --  11 13 15   CREATININE 1.13  --  0.87 1.02 0.96  CALCIUM 9.0  --  8.6* 9.0 8.7*  PROT 6.9  --   --  7.1  --   ALBUMIN 3.7  --   --  3.7  --   AST 22  --   --  26  --   ALT 14  --   --  20  --   ALKPHOS 61  --   --  55  --   BILITOT 0.9  --   --  1.1  --   GFRNONAA >60  --  >60 >60 >60  ANIONGAP 9  --  7 8 7    < > = values in this interval not displayed.     Hematology Recent Labs  Lab 10/02/20 0536 10/05/20 0811 10/06/20 0041  WBC 5.8 6.3 4.4  RBC 4.51 4.73 4.61  HGB 14.6 14.9 14.3  HCT 41.8 43.7 42.3  MCV 92.7 92.4 91.8  MCH 32.4 31.5 31.0  MCHC 34.9 34.1 33.8  RDW 13.9 13.7 13.8  PLT 192 202 165    Cardiac Enzymes  Recent Labs  Lab 10/05/20 0811 10/05/20 1011 10/05/20 1332 10/05/20 1636 10/05/20 1950  TROPONINIHS 144* 101* 131* 208* 257*      BNP Recent Labs  Lab 10/05/20 0811  BNP 130.9*    Lipids  Lab Results  Component Value Date   CHOL 118 10/06/2020   HDL 31 (L) 10/06/2020   LDLCALC 78 10/06/2020   TRIG 45 10/06/2020   CHOLHDL 3.8 10/06/2020    HbA1c  Lab Results  Component Value Date   HGBA1C 5.1 10/05/2020    Radiology    DG Chest Portable 1 View  Result Date: 10/05/2020 CLINICAL DATA:  Shortness of breath.  Chest pain. EXAM: PORTABLE CHEST 1 VIEW COMPARISON:  10/01/2020 FINDINGS: 0825 hours. The lungs are clear without focal pneumonia, edema, pneumothorax or pleural effusion. Interstitial markings are diffusely coarsened with chronic features. Cardiopericardial silhouette is at upper limits of normal for size. The visualized bony structures of the thorax show no acute abnormality. Telemetry leads overlie the chest. IMPRESSION: No active disease. Electronically Signed   By: Misty Stanley M.D.   On: 10/05/2020 09:00   Telemetry    RSR, sinus brady.  12 beats WCT  (irregular) - Personally Reviewed  Cardiac Studies   2D Echocardiogram 7.15.2022  1. Left ventricular ejection fraction, by estimation, is 60 to 65%. The  left ventricle has normal function. The left ventricle has no regional  wall motion abnormalities. There is severe left ventricular hypertrophy.  Left ventricular diastolic parameters   are consistent with Grade I diastolic dysfunction (impaired relaxation).   2. Right ventricular systolic function is normal. The right ventricular  size is normal.   3. Left atrial size was moderately dilated.   Patient Profile     65 y.o. 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  Assessment & Plan    1.  Chest pain /NSTEMI:   Patient without prior history of CAD but with with risk factors including hypertension, hyperlipidemia, stroke, obesity, and tobacco abuse.  He was seen in the emergency department on July 14 with atypical chest/right axillary pain and troponin elevation to 75.  He was markedly hypertensive at the time.  He left AMA.  He developed flulike symptoms beginning Saturday, July 16, and then chest pain on the morning of 7/18 prompting repeat ED eval.  Ss resolved w/sl ntg.  HsTrop elevated @ 144  1  131  208  257.  COVID pcr +.  Pain free this AM.  Plan diagnostic cath this afternoon.  Cont heparin, statin, asa, ? blocker.  2.  Essential HTN:  BP remains elevated.  Cont ? blocker, acei, hydralazine, and amlodipine.  Prev noncompliant.  Follow BPs today - may be able to titrate carvedilol a little further.  3.  HL:  Cont lipitor 40.  4.  Tob Abuse:  cessation advised.  5.  COVID-19 infection:  Feels well this AM.  Low grade fever overnight but currently afebrile.  Per IM.  6.  Right Axillary Mass/nerve sheath tumor:  will f/u w/ nsu @ [facility].  7.  Hypokalemia: low-nl  this am.  Signed, Murray Hodgkins, NP  10/06/2020, 10:43 AM    For questions or updates, please contact   Please consult [email or web address] for contact info  under Cardiology/STEMI.

## 2020-10-06 NOTE — H&P (View-Only) (Signed)
Progress Note  Patient Name: Miguel Fletcher Date of Encounter: 10/06/2020  Primary Cardiologist: Ida Rogue, MD  Subjective   "Feel great!"  No chest pain or sob.  Eager for cath and discharge.  Inpatient Medications    Scheduled Meds:  amLODipine  10 mg Oral QPM   aspirin  81 mg Oral Daily   atorvastatin  40 mg Oral Daily   carvedilol  3.125 mg Oral BID WC   hydrALAZINE  100 mg Oral TID   lisinopril  40 mg Oral Daily   nicotine  21 mg Transdermal Daily   sodium chloride flush  3 mL Intravenous Q12H   Continuous Infusions:  heparin 1,700 Units/hr (10/05/20 2244)   PRN Meds: acetaminophen, albuterol, dextromethorphan-guaiFENesin, hydrALAZINE, hydrOXYzine, morphine injection, nitroGLYCERIN   Vital Signs    Vitals:   10/05/20 2235 10/06/20 0020 10/06/20 0410 10/06/20 0739  BP: (!) 160/65 (!) 172/59 (!) 154/66 (!) 177/67  Pulse: 92 91 (!) 54   Resp:  11 18   Temp: 100.3 F (37.9 C) 100 F (37.8 C) 98.5 F (36.9 C) 98.7 F (37.1 C)  TempSrc: Oral Oral Oral Oral  SpO2: 97% 98% 96% 98%  Weight:      Height:        Intake/Output Summary (Last 24 hours) at 10/06/2020 1043 Last data filed at 10/06/2020 0758 Gross per 24 hour  Intake 700.82 ml  Output 1050 ml  Net -349.18 ml   Filed Weights   10/05/20 0813  Weight: 94.8 kg    Physical Exam   GEN: Well nourished, well developed, in no acute distress.  HEENT: Grossly normal.  Neck: Supple, no JVD, carotid bruits, or masses. Cardiac: RRR, 2/6 syst murmur throughout, no rubs, or gallops. No clubbing, cyanosis, edema.  Radials 2+, DP/PT 2+ and equal bilaterally.  Respiratory:  Respirations regular and unlabored, scattered rhonchi throughout. GI: Soft, nontender, nondistended, BS + x 4. MS: no deformity or atrophy. Skin: warm and dry, no rash. Neuro:  Strength and sensation are intact. Psych: AAOx3.  Normal affect.  Labs    Chemistry Recent Labs  Lab 10/01/20 1203 10/01/20 1847 10/02/20 0536  10/05/20 0811 10/06/20 0646  NA 139  --  140 139 138  K 2.7*   < > 2.8* 3.2* 3.7  CL 103  --  107 104 106  CO2 27  --  26 27 25   GLUCOSE 87  --  90 98 95  BUN 14  --  11 13 15   CREATININE 1.13  --  0.87 1.02 0.96  CALCIUM 9.0  --  8.6* 9.0 8.7*  PROT 6.9  --   --  7.1  --   ALBUMIN 3.7  --   --  3.7  --   AST 22  --   --  26  --   ALT 14  --   --  20  --   ALKPHOS 61  --   --  55  --   BILITOT 0.9  --   --  1.1  --   GFRNONAA >60  --  >60 >60 >60  ANIONGAP 9  --  7 8 7    < > = values in this interval not displayed.     Hematology Recent Labs  Lab 10/02/20 0536 10/05/20 0811 10/06/20 0041  WBC 5.8 6.3 4.4  RBC 4.51 4.73 4.61  HGB 14.6 14.9 14.3  HCT 41.8 43.7 42.3  MCV 92.7 92.4 91.8  MCH 32.4 31.5 31.0  MCHC 34.9 34.1 33.8  RDW 13.9 13.7 13.8  PLT 192 202 165    Cardiac Enzymes  Recent Labs  Lab 10/05/20 0811 10/05/20 1011 10/05/20 1332 10/05/20 1636 10/05/20 1950  TROPONINIHS 144* 101* 131* 208* 257*      BNP Recent Labs  Lab 10/05/20 0811  BNP 130.9*    Lipids  Lab Results  Component Value Date   CHOL 118 10/06/2020   HDL 31 (L) 10/06/2020   LDLCALC 78 10/06/2020   TRIG 45 10/06/2020   CHOLHDL 3.8 10/06/2020    HbA1c  Lab Results  Component Value Date   HGBA1C 5.1 10/05/2020    Radiology    DG Chest Portable 1 View  Result Date: 10/05/2020 CLINICAL DATA:  Shortness of breath.  Chest pain. EXAM: PORTABLE CHEST 1 VIEW COMPARISON:  10/01/2020 FINDINGS: 0825 hours. The lungs are clear without focal pneumonia, edema, pneumothorax or pleural effusion. Interstitial markings are diffusely coarsened with chronic features. Cardiopericardial silhouette is at upper limits of normal for size. The visualized bony structures of the thorax show no acute abnormality. Telemetry leads overlie the chest. IMPRESSION: No active disease. Electronically Signed   By: Misty Stanley M.D.   On: 10/05/2020 09:00   Telemetry    RSR, sinus brady.  12 beats WCT  (irregular) - Personally Reviewed  Cardiac Studies   2D Echocardiogram 7.15.2022  1. Left ventricular ejection fraction, by estimation, is 60 to 65%. The  left ventricle has normal function. The left ventricle has no regional  wall motion abnormalities. There is severe left ventricular hypertrophy.  Left ventricular diastolic parameters   are consistent with Grade I diastolic dysfunction (impaired relaxation).   2. Right ventricular systolic function is normal. The right ventricular  size is normal.   3. Left atrial size was moderately dilated.   Patient Profile     65 y.o. 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  Assessment & Plan    1.  Chest pain /NSTEMI:   Patient without prior history of CAD but with with risk factors including hypertension, hyperlipidemia, stroke, obesity, and tobacco abuse.  He was seen in the emergency department on July 14 with atypical chest/right axillary pain and troponin elevation to 75.  He was markedly hypertensive at the time.  He left AMA.  He developed flulike symptoms beginning Saturday, July 16, and then chest pain on the morning of 7/18 prompting repeat ED eval.  Ss resolved w/sl ntg.  HsTrop elevated @ 144  1  131  208  257.  COVID pcr +.  Pain free this AM.  Plan diagnostic cath this afternoon.  Cont heparin, statin, asa, ? blocker.  2.  Essential HTN:  BP remains elevated.  Cont ? blocker, acei, hydralazine, and amlodipine.  Prev noncompliant.  Follow BPs today - may be able to titrate carvedilol a little further.  3.  HL:  Cont lipitor 40.  4.  Tob Abuse:  cessation advised.  5.  COVID-19 infection:  Feels well this AM.  Low grade fever overnight but currently afebrile.  Per IM.  6.  Right Axillary Mass/nerve sheath tumor:  will f/u w/ nsu @ [facility].  7.  Hypokalemia: low-nl  this am.  Signed, Murray Hodgkins, NP  10/06/2020, 10:43 AM    For questions or updates, please contact   Please consult [email or web address] for contact info  under Cardiology/STEMI.

## 2020-10-06 NOTE — Progress Notes (Signed)
PROGRESS NOTE  Miguel Fletcher  DOB: 04/01/1955  PCP: [facility] [place] WGN:[id number]  DOA: 10/05/2020  LOS: 1 day  Hospital Day: 2   Chief Complaint  Patient presents with   Chest Pain   Brief narrative: Miguel Fletcher is a 65 y.o. 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 He was recently hospitalized from 7/14-7/15 for chest pain, had negative CT angio for aortic dissection.  Troponin was elevated to 75.  Patient refused to stay in the hospital for further work-up and treatment.  With recurrence of chest pain at home, he decided to come to the ED again on 7/18.  In the ED, temperature 99.9, blood pressure was elevated to 197/72, oxygen saturation maintained on room air. Troponin level is elevated to 144 subsequently down to 101. COVID PCR positive Chest x-ray did not show any infiltrates EKG with normal sinus rhythm, IV conduction delay, LVH, LAFB Admitted to hospitalist service.  Cardiology was consulted.  Subjective: Patient was seen and examined this morning.  Pleasant middle-aged Caucasian male.  Looks older for his age.  Not in distress. Waiting for cardiac cath this afternoon  Assessment/Plan: Non-STEMI -Presented with recurrence of chest pain, in the setting of uncontrolled hypertension, long-term smoking, hyperlipidemia -Troponin elevated, continues to uptrend. -EKG without significant ST-T wave changes -Cardiology consult appreciated.  Noted recommendation to cardiac cath. -Currently on aspirin, beta-blocker, statin and heparin drip. Recent Labs    10/05/20 1332 10/05/20 1636 10/05/20 1950  TROPONINIHS 131* 208* 257*   Uncontrolled hypertension -Does not appear to be taking home  medications -Currently on lisinopril 40 mg daily, amlodipine 10 mg daily, hydralazine 100 mg 3 times daily.  Also started on Coreg.  -HCTZ is on hold.  History of stroke Hyperlipidemia -Continue aspirin 81 mg daily, Lipitor 40 mg daily -Lipid panel with HDL 31, LDL 78.  A1c 5.1  Hypokalemia -Potassium level seems to be consistently low.  Low at 3.2 on admission.  Replacement given. Recent Labs  Lab 10/01/20 1203 10/01/20 1411 10/01/20 1847 10/02/20 0536 10/05/20 0811 10/06/20 0646  K 2.7*  --  3.3* 2.8* 3.2* 3.7  MG  --  2.2  --  2.2 2.1  --    Chronic everyday smoker -Counseled to quit. -Nicotine patch offered   COVID-19 test positive -Asymptomatic.  Chest x-ray negative.  Currently breathing on room air. -Continue Mucinex and bronchodilators.   Mobility: Encourage ambulation Code Status:   Code Status: Full Code  Nutritional status: Body mass index is 29.99 kg/m.     Diet:  Diet Order             Diet NPO time specified Except for: Ice Chips, Sips with Meds  Diet effective midnight                  DVT prophylaxis: Currently on heparin drip   Antimicrobials: None Fluid: None Consultants: Cardiology Family Communication: None at bedside  Status is: Inpatient  Remains inpatient appropriate because: Pending cardiac cath this afternoon.  Dispo: The patient is from: Home              Anticipated d/c is to: Home              Patient currently is not  medically stable to d/c.   Difficult to place patient No     Infusions:   heparin 1,700 Units/hr (10/05/20 2244)    Scheduled Meds:  amLODipine  10 mg Oral QPM   aspirin  81 mg Oral Daily   atorvastatin  40 mg Oral Daily   carvedilol  3.125 mg Oral BID WC   hydrALAZINE  100 mg Oral TID   lisinopril  40 mg Oral Daily   nicotine  21 mg Transdermal Daily   sodium chloride flush  3 mL Intravenous Q12H    Antimicrobials: Anti-infectives (From admission, onward)    None       PRN  meds: acetaminophen, albuterol, dextromethorphan-guaiFENesin, hydrALAZINE, hydrOXYzine, morphine injection, nitroGLYCERIN   Objective: Vitals:   10/06/20 0410 10/06/20 0739  BP: (!) 154/66 (!) 177/67  Pulse: (!) 54   Resp: 18   Temp: 98.5 F (36.9 C) 98.7 F (37.1 C)  SpO2: 96% 98%    Intake/Output Summary (Last 24 hours) at 10/06/2020 1330 Last data filed at 10/06/2020 0758 Gross per 24 hour  Intake 700.82 ml  Output 1050 ml  Net -349.18 ml   Filed Weights   10/05/20 0813  Weight: 94.8 kg   Weight change:  Body mass index is 29.99 kg/m.   Physical Exam: General exam: Pleasant, middle-aged Caucasian male.  Looks older for his age. Skin: No rashes, lesions or ulcers. HEENT: Atraumatic, normocephalic, no obvious bleeding Lungs: Clear to auscultation bilaterally CVS: Regular rate and rhythm, no murmur GI/Abd soft, nontender, nondistended, bowel sound present CNS: Alert, awake, oriented x3  Psychiatry: Mood appropriate Extremities: No pedal edema, no calf tenderness  Data Review: I have personally reviewed the laboratory data and studies available.  Recent Labs  Lab 10/01/20 1203 10/02/20 0536 10/05/20 0811 10/06/20 0041  WBC 6.2 5.8 6.3 4.4  HGB 14.7 14.6 14.9 14.3  HCT 42.0 41.8 43.7 42.3  MCV 90.9 92.7 92.4 91.8  PLT 209 192 202 165   Recent Labs  Lab 10/01/20 1203 10/01/20 1411 10/01/20 1847 10/02/20 0536 10/05/20 0811 10/06/20 0646  NA 139  --   --  140 139 138  K 2.7*  --  3.3* 2.8* 3.2* 3.7  CL 103  --   --  107 104 106  CO2 27  --   --  26 27 25   GLUCOSE 87  --   --  90 98 95  BUN 14  --   --  11 13 15   CREATININE 1.13  --   --  0.87 1.02 0.96  CALCIUM 9.0  --   --  8.6* 9.0 8.7*  MG  --  2.2  --  2.2 2.1  --     F/u labs ordered Unresulted Labs (From admission, onward)     Start     Ordered   10/07/20 0500  Heparin level (unfractionated)  Tomorrow morning,   R       Question:  Specimen collection method  Answer:  Lab=Lab collect    10/06/20 0732   10/07/20 9935  Basic metabolic panel  Daily,   R     Question:  Specimen collection method  Answer:  Lab=Lab collect   10/06/20 1330   10/07/20 0500  CBC with Differential/Platelet  Daily,   R     Question:  Specimen collection method  Answer:  Lab=Lab collect   10/06/20 1330            Signed, Terrilee Croak, MD [facility] 10/06/2020

## 2020-10-06 NOTE — Consult Note (Signed)
ANTICOAGULATION CONSULT NOTE - Initial Consult  Pharmacy Consult for Heparin infusion Indication: chest pain/ACS  No Known Allergies  Patient Measurements: Height: 5\' 10"  (177.8 cm) Weight: 94.8 kg (209 lb) IBW/kg (Calculated) : 73 Heparin Dosing Weight: 92.4 kg   Vital Signs: Temp: 100 F (37.8 C) (07/19 0020) Temp Source: Oral (07/19 0020) BP: 172/59 (07/19 0020) Pulse Rate: 91 (07/19 0020)  Labs: Recent Labs    10/05/20 0811 10/05/20 1011 10/05/20 1332 10/05/20 1636 10/05/20 1950 10/06/20 0041  HGB 14.9  --   --   --   --  14.3  HCT 43.7  --   --   --   --  42.3  PLT 202  --   --   --   --  165  APTT 29  --   --   --   --   --   LABPROT 13.0  --   --   --   --   --   INR 1.0  --   --   --   --   --   HEPARINUNFRC  --   --   --  0.28*  --  0.43  CREATININE 1.02  --   --   --   --   --   TROPONINIHS 144*   < > 131* 208* 257*  --    < > = values in this interval not displayed.     Estimated Creatinine Clearance: 84.5 mL/min (by C-G formula based on SCr of 1.02 mg/dL).   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 08/2020 MRI [facility]): Well-circumscribed T1 hypointense, T2 hyperintense enhancing mass within the right axilla measuring 3.1 x 2.6 x 3.0 cm.  Mass extends along the course of the axillary neurovascular structures.  No evidence for invasion into the adjacent musculature.  No enlarged axillary lymph nodes.   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 Troponin 144. Pharmacy has been consulted for initiation and management of heparin infusion for ACS/chest pain.    Date/Time HL Comment 7/18 @1636  0.28 Subtherapeutic, 1500 u/hr - confirmed with nurse no issues with heparin infusion or line  Goal of Therapy:  Heparin level 0.3-0.7 units/ml Monitor platelets by anticoagulation protocol: Yes   Plan:  7/19: HL @ 0041 = 0.43, therapeutic X 1  Will continue pt on current rate and draw confirmation level on 7/19 @ 0700.   Orene Desanctis, PharmD Clinical Pharmacist   10/06/2020,1:44 AM

## 2020-10-07 ENCOUNTER — Encounter: Payer: Self-pay | Admitting: Cardiovascular Disease

## 2020-10-07 LAB — BASIC METABOLIC PANEL
Anion gap: 6 (ref 5–15)
BUN: 14 mg/dL (ref 8–23)
CO2: 25 mmol/L (ref 22–32)
Calcium: 8.5 mg/dL — ABNORMAL LOW (ref 8.9–10.3)
Chloride: 108 mmol/L (ref 98–111)
Creatinine, Ser: 0.9 mg/dL (ref 0.61–1.24)
GFR, Estimated: >60 mL/min (ref >60)
Glucose, Bld: 88 mg/dL (ref 70–99)
Potassium: 3.5 mmol/L (ref 3.5–5.1)
Sodium: 139 mmol/L (ref 135–145)

## 2020-10-07 LAB — CBC WITH DIFFERENTIAL/PLATELET
Abs Immature Granulocytes: 0.01 10*3/uL (ref 0.00–0.07)
Basophils Absolute: 0 10*3/uL (ref 0.0–0.1)
Basophils Relative: 0 %
Eosinophils Absolute: 0.1 10*3/uL (ref 0.0–0.5)
Eosinophils Relative: 2 %
HCT: 42.2 % (ref 39.0–52.0)
Hemoglobin: 14.3 g/dL (ref 13.0–17.0)
Immature Granulocytes: 0 %
Lymphocytes Relative: 25 %
Lymphs Abs: 0.9 10*3/uL (ref 0.7–4.0)
MCH: 32.3 pg (ref 26.0–34.0)
MCHC: 33.9 g/dL (ref 30.0–36.0)
MCV: 95.3 fL (ref 80.0–100.0)
Monocytes Absolute: 0.6 10*3/uL (ref 0.1–1.0)
Monocytes Relative: 17 %
Neutro Abs: 2 10*3/uL (ref 1.7–7.7)
Neutrophils Relative %: 56 %
Platelets: 146 10*3/uL — ABNORMAL LOW (ref 150–400)
RBC: 4.43 MIL/uL (ref 4.22–5.81)
RDW: 14 % (ref 11.5–15.5)
WBC: 3.6 10*3/uL — ABNORMAL LOW (ref 4.0–10.5)
nRBC: 0 % (ref 0.0–0.2)

## 2020-10-07 MED ORDER — ISOSORBIDE MONONITRATE ER 60 MG PO TB24: 60.0000 mg | ORAL_TABLET | Freq: Every day | ORAL | 1 refills | Status: DC

## 2020-10-07 MED ORDER — ASPIRIN EC 81 MG PO TBEC
81.0000 mg | DELAYED_RELEASE_TABLET | Freq: Every day | ORAL | 11 refills | Status: DC
Start: 2020-10-07 — End: 2020-11-02

## 2020-10-07 MED ORDER — AMLODIPINE BESYLATE 10 MG PO TABS: 10.0000 mg | ORAL_TABLET | Freq: Every evening | ORAL | 1 refills | Status: DC

## 2020-10-07 MED ORDER — LISINOPRIL 40 MG PO TABS: 40.0000 mg | ORAL_TABLET | Freq: Every day | ORAL | 1 refills | Status: DC

## 2020-10-07 MED ORDER — ATORVASTATIN CALCIUM 40 MG PO TABS: 40.0000 mg | ORAL_TABLET | Freq: Every day | ORAL | 1 refills | Status: DC

## 2020-10-07 MED ORDER — CLONIDINE HCL 0.1 MG PO TABS: 0.1000 mg | ORAL_TABLET | Freq: Three times a day (TID) | ORAL | Status: DC

## 2020-10-07 MED ORDER — HYDRALAZINE HCL 100 MG PO TABS: 100.0000 mg | ORAL_TABLET | Freq: Three times a day (TID) | ORAL | 1 refills | Status: DC

## 2020-10-07 MED ORDER — NITROGLYCERIN 0.4 MG SL SUBL: 0.4000 mg | SUBLINGUAL_TABLET | SUBLINGUAL | 0 refills | Status: DC | PRN

## 2020-10-07 MED ORDER — CARVEDILOL 3.125 MG PO TABS: 3.1250 mg | ORAL_TABLET | Freq: Two times a day (BID) | ORAL | 1 refills | Status: DC

## 2020-10-07 MED ORDER — SPIRONOLACTONE 25 MG PO TABS
25.0000 mg | ORAL_TABLET | Freq: Every day | ORAL | Status: DC
  Administered 2020-10-07: 25 mg via ORAL
  Filled 2020-10-07: qty 1

## 2020-10-07 MED ORDER — SPIRONOLACTONE 25 MG PO TABS: 25.0000 mg | ORAL_TABLET | Freq: Every day | ORAL | 1 refills | Status: DC

## 2020-10-07 NOTE — Progress Notes (Signed)
Miguel Fletcher to be D/C'd Home per MD order.  Discussed with the patient and all questions fully answered.  VSS, Skin clean, dry and intact without evidence of skin break down, no evidence of skin tears noted. IV catheters discontinued intact. Site without signs and symptoms of complications. Dressing and pressure applied.  An After Visit Summary was printed and given to the patient. Patient prescriptions sent to pharamacy.  D/c education completed with patient/family including follow up instructions, medication list, d/c activities limitations if indicated, with other d/c instructions as indicated by MD - patient able to verbalize understanding, all questions fully answered.   Patient instructed to return to ED, call 911, or call MD for any changes in condition.   Patient escorted via [place], and D/C home via private auto.  [name] 10/07/2020 11:06 AM

## 2020-10-07 NOTE — Progress Notes (Signed)
Progress Note  Patient Name: Miguel Fletcher Date of Encounter: 10/07/2020  [facility] Cardiologist: Ida Rogue, MD   Subjective   Seen this morning, denies chest pain or shortness of breath.  Aware of left heart cath results.  Plans to follow-up outpatient for CT surgery referral and evaluation.  Ready to go home.  Inpatient Medications    Scheduled Meds:  amLODipine  10 mg Oral QPM   aspirin  81 mg Oral Daily   atorvastatin  40 mg Oral Daily   carvedilol  3.125 mg Oral BID WC   heparin  5,000 Units Subcutaneous Q8H   hydrALAZINE  100 mg Oral TID   isosorbide mononitrate  30 mg Oral Daily   lisinopril  40 mg Oral Daily   nicotine  21 mg Transdermal Daily   sodium chloride flush  3 mL Intravenous Q12H   sodium chloride flush  3 mL Intravenous Q12H   spironolactone  25 mg Oral Daily   Continuous Infusions:  sodium chloride     PRN Meds: sodium chloride, acetaminophen, albuterol, dextromethorphan-guaiFENesin, hydrALAZINE, hydrOXYzine, morphine injection, nitroGLYCERIN, ondansetron (ZOFRAN) IV, sodium chloride flush, traZODone   Vital Signs    Vitals:   10/07/20 0659 10/07/20 0800 10/07/20 1009 10/07/20 1049  BP: (!) 175/73 (!) 170/80 (!) 185/79 (!) 156/75  Pulse:   (!) 55 (!) 53  Resp:  16    Temp:  98 F (36.7 C)    TempSrc:  Oral    SpO2:  100%    Weight:      Height:        Intake/Output Summary (Last 24 hours) at 10/07/2020 1249 Last data filed at 10/07/2020 0854 Gross per 24 hour  Intake 979.76 ml  Output --  Net 979.76 ml   Last 3 Weights 10/05/2020 10/01/2020 07/01/2019  Weight (lbs) 209 lb 220 lb 220 lb  Weight (kg) 94.802 kg 99.791 kg 99.791 kg      Telemetry    Sinus bradycardia, heart rate 53- Personally Reviewed  ECG    No new EKG obtained- Personally Reviewed  Physical Exam   GEN:  Well nourished, well developed in no acute distress HEENT: Normal NECK: No JVD; No carotid bruits CARDIAC: RRR, no murmurs, rubs,  gallops RESPIRATORY: Clear anteriorly, no wheezing ABDOMEN: Soft, non-tender, non-distended MUSCULOSKELETAL:  No edema; No deformity SKIN: Warm and dry NEUROLOGIC:  Alert and oriented x 3 PSYCHIATRIC:  Normal affect  Labs    High Sensitivity Troponin:   Recent Labs  Lab 10/05/20 0811 10/05/20 1011 10/05/20 1332 10/05/20 1636 10/05/20 1950  TROPONINIHS 144* 101* 131* 208* 257*      Chemistry Recent Labs  Lab 10/01/20 1203 10/01/20 1847 10/05/20 0811 10/06/20 0646 10/07/20 0609  NA 139   < > 139 138 139  K 2.7*   < > 3.2* 3.7 3.5  CL 103   < > 104 106 108  CO2 27   < > 27 25 25   GLUCOSE 87   < > 98 95 88  BUN 14   < > 13 15 14   CREATININE 1.13   < > 1.02 0.96 0.90  CALCIUM 9.0   < > 9.0 8.7* 8.5*  PROT 6.9  --  7.1  --   --   ALBUMIN 3.7  --  3.7  --   --   AST 22  --  26  --   --   ALT 14  --  20  --   --  ALKPHOS 61  --  55  --   --   BILITOT 0.9  --  1.1  --   --   GFRNONAA >60   < > >60 >60 >60  ANIONGAP 9   < > 8 7 6    < > = values in this interval not displayed.     Hematology Recent Labs  Lab 10/05/20 0811 10/06/20 0041 10/07/20 0609  WBC 6.3 4.4 3.6*  RBC 4.73 4.61 4.43  HGB 14.9 14.3 14.3  HCT 43.7 42.3 42.2  MCV 92.4 91.8 95.3  MCH 31.5 31.0 32.3  MCHC 34.1 33.8 33.9  RDW 13.7 13.8 14.0  PLT 202 165 146*    BNP Recent Labs  Lab 10/05/20 0811  BNP 130.9*     DDimer No results for input(s): DDIMER in the last 168 hours.   Radiology    CARDIAC CATHETERIZATION  Result Date: 10/06/2020 Formatting of this result is different from the original.   Dist LM to Prox LAD lesion is 70% stenosed.   Ramus lesion is 70% stenosed.   2nd Diag lesion is 60% stenosed.   Mid RCA lesion is 40% stenosed.   RPDA lesion is 50% stenosed.   RPAV lesion is 50% stenosed. 1.  Moderately severe, hemodynamically significant distal left main disease extending into the proximal LAD, confirmed by intravascular ultrasound assessment with minimal lumen area  approximately 4 mm. 2.  Moderately severe eccentric stenosis of the ramus intermedius branch 3.  Patent left circumflex with mild nonobstructive disease 4.  Patent RCA with mild to moderate mid vessel stenosis and moderate stenosis involving the PDA and PLA branch vessels. 5.  Systemic hypertension The patient has surgical coronary anatomy.  His coronary disease is clearly significant but not critical and it would be reasonable to treat him with medical therapy, control his blood pressure, allow him to recover from [place], and arrange outpatient cardiac surgical consultation for consideration of CABG.    Cardiac Studies   Lhc 10/06/2020   Dist LM to Prox LAD lesion is 70% stenosed.   Ramus lesion is 70% stenosed.   2nd Diag lesion is 60% stenosed.   Mid RCA lesion is 40% stenosed.   RPDA lesion is 50% stenosed.   RPAV lesion is 50% stenosed.   1.  Moderately severe, hemodynamically significant distal left main disease extending into the proximal LAD, confirmed by intravascular ultrasound assessment with minimal lumen area approximately 4 mm. 2.  Moderately severe eccentric stenosis of the ramus intermedius branch 3.  Patent left circumflex with mild nonobstructive disease 4.  Patent RCA with mild to moderate mid vessel stenosis and moderate stenosis involving the PDA and PLA branch vessels. 5.  Systemic hypertension   The patient has surgical coronary anatomy.  His coronary disease is clearly significant but not critical and it would be reasonable to treat him with medical therapy, control his blood pressure, allow him to recover from [place], and arrange outpatient cardiac surgical consultation for consideration of CABG.  Echo 10/02/2020 1. Left ventricular ejection fraction, by estimation, is 60 to 65%. The  left ventricle has normal function. The left ventricle has no regional  wall motion abnormalities. There is severe left ventricular hypertrophy.  Left ventricular diastolic parameters    are consistent with Grade I diastolic dysfunction (impaired relaxation).   2. Right ventricular systolic function is normal. The right ventricular  size is normal.   3. Left atrial size was moderately dilated.  Patient Profile     65  y.o. male with history of hypertension, smoker x50+ years presenting with chest pain, diagnosed with NSTEMI, underwent left heart cath showing significant CAD involving left main and ramus branch.  Assessment & Plan    NSTEMI, chest pain -Currently asymptomatic -Left heart cath showing left main and ramus disease -Medical management, outpatient CT surgery consult for CABG evaluation -Continue aspirin, Lipitor.  2.  Hypertension -Add Aldactone to BP regimen -Continue Coreg, lisinopril, hydralazine, amlodipine  3.  Chronic smoker -Cessation advised  Total encounter time 35 minutes  Greater than 50% was spent in counseling and coordination of care with the patient       Signed, Kate Sable, MD  10/07/2020, 12:49 PM

## 2020-10-07 NOTE — Discharge Summary (Signed)
Physician Discharge Summary  Miguel Fletcher WIO:[id number] DOB: 1955-07-10 DOA: 10/05/2020  PCP: [facility] date: 10/05/2020 Discharge date: 10/07/2020  Admitted From: Home Disposition: Home   Recommendations for Outpatient Follow-up:  Follow up with cardiology in 1 week for referral for CABG.  Recheck BP and BMP within the next week. Added imdur 60mg  daily, coreg 3.125mg  BID, spironolactone 25mg  daily to HTN regimen. Also starting lipitor, continuing aspirin.   Home Health: None Equipment/Devices: None Discharge Condition: Stable CODE STATUS: Full Diet recommendation: Heart healthy  Brief/Interim Summary: ANTIONE Fletcher is a 65 y.o. 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 He was recently hospitalized from 7/14-7/15 for chest pain, had negative CT angio for aortic dissection.  Troponin was elevated to 75.  Patient refused to stay in the hospital for further work-up and treatment.  With recurrence of chest pain at home, he decided to come to the ED again on 7/18.   In the ED, temperature 99.9, blood pressure was elevated to 197/72, oxygen saturation maintained on room air. Troponin level is elevated to 144 subsequently down to 101. COVID PCR positive Chest x-ray did not show any infiltrates EKG with normal sinus rhythm, IV conduction delay, LVH, LAFB Admitted to hospitalist service.  Cardiology was consulted, performed LHC that revealed multivessel disease for which CABG is recommended. The patient is stable and cardiology has recommended discharge home at this time.  Discharge Diagnoses:  Principal Problem:   NSTEMI (non-ST elevated myocardial infarction) ([place]) Active Problems:   Hypokalemia    HTN (hypertension)   HLD (hyperlipidemia)   Stroke (H[facility])   Tobacco use   COVID-19 virus infection  Non-STEMI, chest pain: Asymptomatic at discharge. Cardiology recommending outpatient CT surgery evaluation for CABG. Continue ASA, statin, beta blocker. Received heparin.   Uncontrolled hypertension - Adding spironolactone with improvement.    History of stroke Hyperlipidemia -Continue aspirin 81 mg daily, Lipitor 40 mg daily -Lipid panel with HDL 31, LDL 78.  A1c 5.1   Hypokalemia: Improved.   Chronic everyday smoker -Counseled to quit.   COVID-19 test positive -Asymptomatic.  Chest x-ray negative.  Currently breathing on room air. -Continue 10 days isolation  Discharge Instructions Discharge Instructions     MyChart COVID-19 home monitoring program   Complete by: Oct 07, 2020    Is the patient willing to use the [facility] for home monitoring?: Yes   Diet - low sodium heart healthy   Complete by: As directed    Discharge instructions   Complete by: As directed    You will need to remain in isolation for 10 days For high blood pressure, take norvasc, hydralazine, lisinopril (as you were) and add imdur 60mg  daily, spironolactone 25mg  daily, and coreg twice daily to help lower blood pressure. You should also start taking atorvastatin to lower your risk of heart attack and stroke. Continue taking aspirin 81mg  daily as well.   If you have chest pain, take nitroglycerin and seek medical advice right away. Otherwise, follow up with cardiology at the end of your isolation period to discuss coronary bypass surgery. You will also need your blood pressure and blood work rechecked in the next week, so you are encouraged to follow up with your PCP and/or cardiology  for this.   Increase activity slowly   Complete by: As directed       Allergies as of 10/07/2020   No Known Allergies      Medication List     STOP taking these medications    potassium chloride 10 MEQ  tablet Commonly known as: KLOR-CON       TAKE these medications    amLODipine 10 MG tablet Commonly known as: NORVASC Take 1 tablet (10 mg total) by mouth every evening.   aspirin EC 81 MG tablet Take 1 tablet (81 mg total) by mouth daily.   atorvastatin 40 MG tablet Commonly known as: LIPITOR Take 1 tablet (40 mg total) by mouth daily.   carvedilol 3.125 MG tablet Commonly known as: COREG Take 1 tablet (3.125 mg total) by mouth 2 (two) times daily with a meal.   hydrALAZINE 100 MG tablet Commonly known as: APRESOLINE Take 1 tablet (100 mg total) by mouth 3 (three) times daily.   isosorbide mononitrate 60 MG 24 hr tablet Commonly known as: IMDUR Take 1 tablet (60 mg total) by mouth daily.   lisinopril 40 MG tablet Commonly known as: ZESTRIL Take 1 tablet (40 mg total) by mouth daily.   nitroGLYCERIN 0.4 MG SL tablet Commonly known as: NITROSTAT Place 1 tablet (0.4 mg total) under the tongue every 5 (five) minutes as needed for chest pain.   spironolactone 25 MG tablet Commonly known as: ALDACTONE Take 1 tablet (25 mg total) by mouth daily.        Follow-up [place] Follow up.   Specialty: General Practice Contact information: [place] [address] [phone]         Minna Merritts, MD .   Specialty: Cardiology Contact information: [place] [address] [phone]                No Known Allergies  Consultations: Cardiology  Procedures/Studies: DG Chest 2 View  Result Date: 10/01/2020 CLINICAL DATA:  65 year old male with history of chest pain and facial droop. Right-sided weakness. EXAM: CHEST - 2 VIEW COMPARISON:  Chest x-ray 07/01/2019. FINDINGS: Lung volumes are normal. No consolidative airspace disease. No pleural effusions. No pneumothorax. No pulmonary nodule or mass noted. Pulmonary vasculature and the cardiomediastinal silhouette are  within normal limits. IMPRESSION: No radiographic evidence of acute cardiopulmonary disease. Electronically Signed   By: Vinnie Langton M.D.   On: 10/01/2020 12:46   CT HEAD WO CONTRAST  Result Date: 10/01/2020 CLINICAL DATA:  Neurologic deficit. Right-sided weakness in face. History of Bell's palsy. Chest pain. EXAM: CT HEAD WITHOUT CONTRAST TECHNIQUE: Contiguous axial images were obtained from the base of the skull through the vertex without intravenous contrast. COMPARISON:  04/22/2011 FINDINGS: Brain: No evidence of acute infarction, hemorrhage, hydrocephalus, extra-axial collection or mass lesion/mass effect. There is mild diffuse low-attenuation within the subcortical and periventricular white matter compatible with chronic microvascular disease. Vascular: No hyperdense vessel or unexpected calcification. Skull: Normal. Negative for fracture or focal lesion. Sinuses/Orbits: No acute finding. Other: None IMPRESSION: 1. No acute intracranial abnormalities. 2. Mild chronic small vessel ischemic change. Electronically Signed   By: Kerby Moors M.D.   On: 10/01/2020 16:36   CARDIAC CATHETERIZATION  Result Date: 10/06/2020 Formatting of this result is different from the original.   Dist LM to Prox LAD lesion is 70% stenosed.   Ramus lesion is 70% stenosed.   2nd Diag lesion  is 60% stenosed.   Mid RCA lesion is 40% stenosed.   RPDA lesion is 50% stenosed.   RPAV lesion is 50% stenosed. 1.  Moderately severe, hemodynamically significant distal left main disease extending into the proximal LAD, confirmed by intravascular ultrasound assessment with minimal lumen area approximately 4 mm. 2.  Moderately severe eccentric stenosis of the ramus intermedius branch 3.  Patent left circumflex with mild nonobstructive disease 4.  Patent RCA with mild to moderate mid vessel stenosis and moderate stenosis involving the PDA and PLA branch vessels. 5.  Systemic hypertension The patient has surgical coronary anatomy.   His coronary disease is clearly significant but not critical and it would be reasonable to treat him with medical therapy, control his blood pressure, allow him to recover from [place], and arrange outpatient cardiac surgical consultation for consideration of CABG.   DG Chest Portable 1 View  Result Date: 10/05/2020 CLINICAL DATA:  Shortness of breath.  Chest pain. EXAM: PORTABLE CHEST 1 VIEW COMPARISON:  10/01/2020 FINDINGS: 0825 hours. The lungs are clear without focal pneumonia, edema, pneumothorax or pleural effusion. Interstitial markings are diffusely coarsened with chronic features. Cardiopericardial silhouette is at upper limits of normal for size. The visualized bony structures of the thorax show no acute abnormality. Telemetry leads overlie the chest. IMPRESSION: No active disease. Electronically Signed   By: Misty Stanley M.D.   On: 10/05/2020 09:00   CT ANGIO CHEST AORTA W/CM & OR WO/CM  Result Date: 10/01/2020 CLINICAL DATA:  Chest pain and back pain. Aortic dissection suspected. EXAM: CT ANGIOGRAPHY CHEST WITH CONTRAST TECHNIQUE: Multidetector CT imaging of the chest was performed using the standard protocol during bolus administration of intravenous contrast. Multiplanar CT image reconstructions and MIPs were obtained to evaluate the vascular anatomy. CONTRAST:  72mL OMNIPAQUE IOHEXOL 350 MG/ML SOLN COMPARISON:  Chest radiography same day. FINDINGS: Cardiovascular: Mild cardiomegaly. Coronary artery calcification is present. Aortic atherosclerotic calcification is present. No evidence of dissection or aneurysmal dilatation. Small amount of fluid in the superior pericardial recess. Pulmonary arterial branches appear normal. Mediastinum/Nodes: No mass or lymphadenopathy. Lungs/Pleura: No pleural effusion. No pulmonary edema. No infiltrate or collapse. No mass or nodule. Upper Abdomen: No upper abdominal finding of significance. Musculoskeletal: Chronic degenerative spondylosis and bridging  osteophytes. Review of the MIP images confirms the above findings. IMPRESSION: No acute chest pathology by CT.  No aortic aneurysm or dissection. Aortic Atherosclerosis (ICD10-I70.0). Coronary artery calcification. Electronically Signed   By: Nelson Chimes M.D.   On: 10/01/2020 14:36   ECHOCARDIOGRAM COMPLETE  Result Date: 10/02/2020    ECHOCARDIOGRAM REPORT   Patient Name:   ORY ELTING Date of Exam: 10/02/2020 Medical Rec #:  [id number]         Height:       69.0 in Accession #:    [id number]        Weight:       220.0 lb Date of Birth:  1956-03-17         BSA:          2.151 m Patient Age:    75 years          BP:           176/69 mmHg Patient Gender: M                 HR:           62 bpm. Exam Location:  [facility] Procedure: 2D Echo, Cardiac Doppler and Color Doppler Indications:  Chest pain R07.9                  Elevated troponin  History:         Patient has prior history of Echocardiogram examinations, most                  recent 03/20/2016. Stroke; Risk Factors:Hypertension. Tobacco                  abuse.  Sonographer:     Sherrie Sport RDCS (AE) Referring Phys:  [id number] AMY N COX Diagnosing Phys: Ida Rogue MD  Sonographer Comments: Suboptimal apical window. IMPRESSIONS  1. Left ventricular ejection fraction, by estimation, is 60 to 65%. The left ventricle has normal function. The left ventricle has no regional wall motion abnormalities. There is severe left ventricular hypertrophy. Left ventricular diastolic parameters  are consistent with Grade I diastolic dysfunction (impaired relaxation).  2. Right ventricular systolic function is normal. The right ventricular size is normal.  3. Left atrial size was moderately dilated. FINDINGS  Left Ventricle: Left ventricular ejection fraction, by estimation, is 60 to 65%. The left ventricle has normal function. The left ventricle has no regional wall motion abnormalities. The left ventricular internal cavity size was normal in size. There is  severe left  ventricular hypertrophy. Left ventricular diastolic parameters are consistent with Grade I diastolic dysfunction (impaired relaxation). Right Ventricle: The right ventricular size is normal. No increase in right ventricular wall thickness. Right ventricular systolic function is normal. Left Atrium: Left atrial size was moderately dilated. Right Atrium: Right atrial size was normal in size. Pericardium: Trivial pericardial effusion is present. Mitral Valve: The mitral valve is normal in structure. No evidence of mitral valve regurgitation. No evidence of mitral valve stenosis. Tricuspid Valve: The tricuspid valve is normal in structure. Tricuspid valve regurgitation is not demonstrated. No evidence of tricuspid stenosis. Aortic Valve: The aortic valve is normal in structure. Aortic valve regurgitation is not visualized. Mild to moderate aortic valve sclerosis/calcification is present, without any evidence of aortic stenosis. Aortic valve mean gradient measures 3.0 mmHg. Aortic valve peak gradient measures 5.6 mmHg. Aortic valve area, by VTI measures 3.56 cm. Pulmonic Valve: The pulmonic valve was normal in structure. Pulmonic valve regurgitation is not visualized. No evidence of pulmonic stenosis. Aorta: The aortic root is normal in size and structure. Venous: The inferior vena cava is normal in size with greater than 50% respiratory variability, suggesting right atrial pressure of 3 mmHg. IAS/Shunts: No atrial level shunt detected by color flow Doppler.  LEFT VENTRICLE PLAX 2D LVIDd:         5.00 cm  Diastology LVIDs:         2.89 cm  LV e' medial:    4.03 cm/s LV PW:         2.23 cm  LV E/e' medial:  18.3 LV IVS:        1.67 cm  LV e' lateral:   3.59 cm/s LVOT diam:     2.10 cm  LV E/e' lateral: 20.6 LV SV:         87 LV SV Index:   40 LVOT Area:     3.46 cm  RIGHT VENTRICLE RV Basal diam:  4.52 cm RV S prime:     17.00 cm/s TAPSE (M-mode): 4.2 cm LEFT ATRIUM            Index       RIGHT ATRIUM  Index LA  diam:      4.20 cm  1.95 cm/m  RA Area:     16.90 cm LA Vol (A2C): 70.0 ml  32.54 ml/m RA Volume:   48.30 ml  22.45 ml/m LA Vol (A4C): 153.0 ml 71.12 ml/m  AORTIC VALVE                   PULMONIC VALVE AV Area (Vmax):    3.58 cm    PV Vmax:        1.48 m/s AV Area (Vmean):   3.20 cm    PV Peak grad:   8.8 mmHg AV Area (VTI):     3.56 cm    RVOT Peak grad: 6 mmHg AV Vmax:           118.00 cm/s AV Vmean:          81.700 cm/s AV VTI:            0.244 m AV Peak Grad:      5.6 mmHg AV Mean Grad:      3.0 mmHg LVOT Vmax:         122.00 cm/s LVOT Vmean:        75.500 cm/s LVOT VTI:          0.251 m LVOT/AV VTI ratio: 1.03  AORTA Ao Root diam: 3.93 cm MITRAL VALVE                TRICUSPID VALVE MV Area (PHT): 3.15 cm     TR Peak grad:   11.8 mmHg MV Decel Time: 241 msec     TR Vmax:        172.00 cm/s MV E velocity: 73.80 cm/s MV A velocity: 106.00 cm/s  SHUNTS MV E/A ratio:  0.70         Systemic VTI:  0.25 m                             Systemic Diam: 2.10 cm Ida Rogue MD Electronically signed by Ida Rogue MD Signature Date/Time: 10/02/2020/4:08:10 PM    Final     [facility] 10/06/2020 Dr. Burt Knack:    Dist LM to Prox LAD lesion is 70% stenosed.   Ramus lesion is 70% stenosed.   2nd Diag lesion is 60% stenosed.   Mid RCA lesion is 40% stenosed.   RPDA lesion is 50% stenosed.   RPAV lesion is 50% stenosed.   1.  Moderately severe, hemodynamically significant distal left main disease extending into the proximal LAD, confirmed by intravascular ultrasound assessment with minimal lumen area approximately 4 mm. 2.  Moderately severe eccentric stenosis of the ramus intermedius branch 3.  Patent left circumflex with mild nonobstructive disease 4.  Patent RCA with mild to moderate mid vessel stenosis and moderate stenosis involving the PDA and PLA branch vessels. 5.  Systemic hypertension   The patient has surgical coronary anatomy.  His coronary disease is clearly significant but not critical and it would  be reasonable to treat him with medical therapy, control his blood pressure, allow him to recover from [place], and arrange outpatient cardiac surgical consultation for consideration of CABG.  Subjective: Feels well. Slight cough that is not productive or bothersome. No resting or exertional chest pain. No dyspnea. "Ready to go to the house" this morning. Is fully vaccinated, does not wish to pursue treatment for covid at this time.   Discharge Exam: Vitals:   10/07/20  1009 10/07/20 1049  BP: (!) 185/79 (!) 156/75  Pulse: (!) 55 (!) 53  Resp:    Temp:    SpO2:     General: Pt is alert, awake, not in acute distress Cardiovascular: RRR, S1/S2 +, no rubs, no gallops Respiratory: CTA bilaterally, no wheezing, no rhonchi Abdominal: Soft, NT, ND, bowel sounds + Extremities: No edema, no cyanosis  Labs: BNP (last 3 results) Recent Labs    10/05/20 0811  BNP 503.5*   Basic Metabolic Panel: Recent Labs  Lab 10/05/20 0811 10/06/20 0646 10/07/20 0609  NA 139 138 139  K 3.2* 3.7 3.5  CL 104 106 108  CO2 27 25 25   GLUCOSE 98 95 88  BUN 13 15 14   CREATININE 1.02 0.96 0.90  CALCIUM 9.0 8.7* 8.5*  MG 2.1  --   --    Liver Function Tests: Recent Labs  Lab 10/05/20 0811  AST 26  ALT 20  ALKPHOS 55  BILITOT 1.1  PROT 7.1  ALBUMIN 3.7   Recent Labs  Lab 10/05/20 0811  LIPASE 37   No results for input(s): AMMONIA in the last 168 hours. CBC: Recent Labs  Lab 10/05/20 0811 10/06/20 0041 10/07/20 0609  WBC 6.3 4.4 3.6*  NEUTROABS  --   --  2.0  HGB 14.9 14.3 14.3  HCT 43.7 42.3 42.2  MCV 92.4 91.8 95.3  PLT 202 165 146*   Cardiac Enzymes: No results for input(s): CKTOTAL, CKMB, CKMBINDEX, TROPONINI in the last 168 hours. BNP: Invalid input(s): POCBNP CBG: No results for input(s): GLUCAP in the last 168 hours. D-Dimer No results for input(s): DDIMER in the last 72 hours. Hgb A1c No results for input(s): HGBA1C in the last 72 hours.  Lipid Profile No results  for input(s): CHOL, HDL, LDLCALC, TRIG, CHOLHDL, LDLDIRECT in the last 72 hours.  Thyroid function studies No results for input(s): TSH, T4TOTAL, T3FREE, THYROIDAB in the last 72 hours.  Invalid input(s): FREET3 Anemia work up No results for input(s): VITAMINB12, FOLATE, FERRITIN, TIBC, IRON, RETICCTPCT in the last 72 hours. Urinalysis    Component Value Date/Time   COLORURINE Straw 04/22/2011 0951   APPEARANCEUR Clear 04/22/2011 0951   LABSPEC 1.004 04/22/2011 0951   PHURINE 7.0 04/22/2011 0951   GLUCOSEU Negative 04/22/2011 0951   HGBUR Negative 04/22/2011 0951   BILIRUBINUR Negative 04/22/2011 0951   KETONESUR Negative 04/22/2011 0951   PROTEINUR Negative 04/22/2011 0951   NITRITE Negative 04/22/2011 0951   LEUKOCYTESUR Negative 04/22/2011 0951    Microbiology Recent Results (from the past 240 hour(s))  Resp Panel by RT-PCR (Flu A&B, Covid) Nasopharyngeal Swab     Status: Abnormal   Collection Time: 10/05/20  8:11 AM   Specimen: Nasopharyngeal Swab; Nasopharyngeal(NP) swabs in vial transport medium  Result Value Ref Range Status   SARS Coronavirus 2 by RT PCR POSITIVE (A) NEGATIVE Final    Comment: RESULT CALLED TO, READ BACK BY AND VERIFIED WITH: ASHLEY MURRAY 10/05/20  1008 [facility] (NOTE) SARS-CoV-2 target nucleic acids are DETECTED.  The SARS-CoV-2 RNA is generally detectable in upper respiratory specimens during the acute phase of infection. Positive results are indicative of the presence of the identified virus, but do not rule out bacterial infection or co-infection with other pathogens not detected by the test. Clinical correlation with patient history and other diagnostic information is necessary to determine patient infection status. The expected result is Negative.  Fact Sheet for Patients: EntrepreneurPulse.com.au  Fact Sheet for Healthcare Providers: IncredibleEmployment.be  This test  is not yet approved or cleared by the  [place] and  has been authorized for detection and/or diagnosis of SARS-CoV-2 by FDA under an Emergency Use Authorization (EUA).  This EUA will remain in effect (meaning this test can be  used) for the duration of  the COVID-19 declaration under Section 564(b)(1) of the Act, 21 U.S.C. section 360bbb-3(b)(1), unless the authorization is terminated or revoked sooner.     Influenza A by PCR NEGATIVE NEGATIVE Final   Influenza B by PCR NEGATIVE NEGATIVE Final    Comment: (NOTE) The Xpert Xpress SARS-CoV-2/FLU/RSV plus assay is intended as an aid in the diagnosis of influenza from Nasopharyngeal swab specimens and should not be used as a sole basis for treatment. Nasal washings and aspirates are unacceptable for Xpert Xpress SARS-CoV-2/FLU/RSV testing.  Fact Sheet for Patients: EntrepreneurPulse.com.au  Fact Sheet for Healthcare Providers: IncredibleEmployment.be  This test is not yet approved or cleared by the [place] FDA and has been authorized for detection and/or diagnosis of SARS-CoV-2 by FDA under an Emergency Use Authorization (EUA). This EUA will remain in effect (meaning this test can be used) for the duration of the COVID-19 declaration under Section 564(b)(1) of the Act, 21 U.S.C. section 360bbb-3(b)(1), unless the authorization is terminated or revoked.  Performed at [facility], [address], [place], [place] [address]     Time coordinating discharge: Approximately 40 minutes  Patrecia Pour, MD  [facility] 10/11/2020, 4:43 PM

## 2020-10-13 ENCOUNTER — Other Ambulatory Visit: Payer: Self-pay

## 2020-10-13 ENCOUNTER — Ambulatory Visit (INDEPENDENT_AMBULATORY_CARE_PROVIDER_SITE_OTHER): Payer: Self-pay | Admitting: Nurse Practitioner

## 2020-10-13 ENCOUNTER — Encounter: Payer: Self-pay | Admitting: Nurse Practitioner

## 2020-10-13 VITALS — BP 160/60 | HR 72 | Ht 68.0 in | Wt 211.0 lb

## 2020-10-13 DIAGNOSIS — I517 Cardiomegaly: Secondary | ICD-10-CM

## 2020-10-13 DIAGNOSIS — E785 Hyperlipidemia, unspecified: Secondary | ICD-10-CM

## 2020-10-13 DIAGNOSIS — Z720 Tobacco use: Secondary | ICD-10-CM

## 2020-10-13 DIAGNOSIS — I214 Non-ST elevation (NSTEMI) myocardial infarction: Secondary | ICD-10-CM

## 2020-10-13 DIAGNOSIS — I2510 Atherosclerotic heart disease of native coronary artery without angina pectoris: Secondary | ICD-10-CM

## 2020-10-13 DIAGNOSIS — I10 Essential (primary) hypertension: Secondary | ICD-10-CM

## 2020-10-13 NOTE — Progress Notes (Signed)
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 08/2020 MRI [facility]): Well-circumscribed T1 hypointense, T2 hyperintense enhancing mass within the right axilla measuring 3.1 x 2.6 x 3.0 cm.  Mass extends along the course of the axillary neurovascular structures.  No evidence for invasion into the adjacent musculature.  No enlarged axillary lymph nodes.   Bell's palsy    Biliary colic    CAD (coronary artery disease)    a. 09/2020 NSTEMI/Cath: LM 60-70d, LAD 70p, D2 60, RI 70p, LCX ild diff dzs, RCA 40-9m RPDA 50, RPAV 50--> CT surgical eval.   Diastolic dysfunction    a. 09/2020 Echo: EF 60-65%, no rwma, sev LVH. Gr1 DD. Nl RV size/fxn. Mod dil LA.   Hemorrhoids    Hypercholesterolemia    Hypertension    [place]    Tobacco use    Past Surgical History:  Procedure Laterality Date   INTRAVASCULAR ULTRASOUND/IVUS N/A 10/06/2020   Procedure: Intravascular Ultrasound/IVUS;  Surgeon: [name] MD;  [facility];  Service: Cardiovascular;  Laterality: N/A;   LEFT HEART CATH AND CORONARY ANGIOGRAPHY N/A 10/06/2020   Procedure: LEFT HEART CATH AND CORONARY ANGIOGRAPHY;  Surgeon: [name] MD;  [facility];  Service: Cardiovascular;  Laterality: N/A;   VARICOSE VEIN SURGERY Left     Allergies  No Known Allergies  History of Present Illness    65year old  male with above past medical history including tobacco abuse, hypertension, hyperlipidemia, stroke, Bell's palsy, 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  He has a long history of hypertension which has been poorly controlled with systolics frequently running in the 200 range.  He admits to frequent noncompliance with medications.  [facility].  Troponin was elevated at 75.  Echocardiogram showed an EF of 60-65% with grade 1 diastolic dysfunction.  It was felt that symptoms were secondary to nerve sheath tumor with recommendation for neurosurgery follow-up, as well as blood pressure management.  Patient was to be admitted but left AMA.  He returned on July 18 with a 2-day history of fevers, chills, malaise, and a 1 day history of chest pain and heaviness.  He was found to be COVID-positive.  He was hypertensive on arrival.  He was initially called out as a code STEMI in the setting of an IVCD with LVH but this was subsequently canceled.  His troponin peaked at 144.  He was admitted and underwent diagnostic catheterization revealing severe multivessel coronary artery disease as outlined in the past medical history, with recommendation for CT surgical evaluation as an outpatient.  He was placed on beta-blocker, calcium channel blocker, hydralazine, nitrate, ACE inhibitor, and spironolactone therapy with some improvement in blood pressure.  He was discharged home on July 20 with plan for follow-up today and basic metabolic panel.  Since discharge, he has mostly felt well.  He  stopped taking amlodipine, isosorbide, lisinopril, and spironolactone because he felt like he was "in another world."  He noted lightheadedness while walking.  He is not checking his blood pressures at home.  He has been taking aspirin and hydralazine.  He denies chest pain, dyspnea, palpitations, PND, orthopnea, syncope, edema, or early satiety.  He feels  fully recovered from his COVID infection (he was fully vaccinated previously).  We discussed his hospitalization and catheterization results in detail today.  He understands that he needs a CT surgical evaluation and is looking forward to moving forward.  Home Medications    Current Outpatient Medications  Medication Sig Dispense Refill   aspirin EC 81 MG tablet Take 1 tablet (81 mg total) by mouth daily. 30 tablet 11   atorvastatin (LIPITOR) 40 MG tablet Take 40 mg by mouth daily.     carvedilol (COREG) 3.125 MG tablet Take 1 tablet (3.125 mg total) by mouth 2 (two) times daily with a meal. 60 tablet 1   hydrALAZINE (APRESOLINE) 100 MG tablet Take 1 tablet (100 mg total) by mouth 3 (three) times daily. 90 tablet 1   nitroGLYCERIN (NITROSTAT) 0.4 MG SL tablet Place 1 tablet (0.4 mg total) under the tongue every 5 (five) minutes as needed for chest pain. 5 tablet 0   No current facility-administered medications for this visit.     Review of Systems    Initially felt lightheaded on multiple medications following discharge though this is resolved after he stopped most of them.  He denies chest pain, dyspnea, palpitations, PND, orthopnea, syncope, edema, or early satiety.  All other systems reviewed and are otherwise negative except as noted above.  Physical Exam    VS:  BP (!) 160/60 (BP Location: Left Arm, Patient Position: Sitting, Cuff Size: Normal)   Pulse 72   Ht '5\' 8"'$  (1.727 m)   Wt 211 lb (95.7 kg)   BMI 32.08 kg/m  , BMI Body mass index is 32.08 kg/m.     GEN: Well nourished, well developed, in no acute distress. HEENT: normal. Neck: Supple, no JVD, carotid bruits, or masses. Cardiac: RRR, 2/6 systolic murmur heard throughout, no rubs, or gallops. No clubbing, cyanosis, edema.  Radials 2+/PT 1+ and equal bilaterally.  Right radial catheterization site without bleeding, bruit, or hematoma Respiratory:  Respirations regular and unlabored, clear to auscultation bilaterally. GI:  Soft, nontender, nondistended, BS + x 4. MS: no deformity or atrophy. Skin: warm and dry, no rash. Neuro:  Strength and sensation are intact. Psych: Normal affect.  Accessory Clinical Findings    ECG personally reviewed by me today -regular sinus rhythm, 72, left axis deviation, PVC, septal infarct, LVH with repolarization abnormality- no acute changes.  Lab Results  Component Value Date   WBC 3.6 (L) 10/07/2020   HGB 14.3 10/07/2020   HCT 42.2 10/07/2020   MCV 95.3 10/07/2020   PLT 146 (L) 10/07/2020   Lab Results  Component Value Date   CREATININE 0.90 10/07/2020   BUN 14 10/07/2020   NA 139 10/07/2020   K 3.5 10/07/2020   CL 108 10/07/2020   CO2 25 10/07/2020   Lab Results  Component Value Date   ALT 20 10/05/2020   AST 26 10/05/2020   ALKPHOS 55 10/05/2020   BILITOT 1.1 10/05/2020   Lab Results  Component Value Date   CHOL 118 10/06/2020   HDL 31 (L) 10/06/2020   LDLCALC 78 10/06/2020   TRIG 45 10/06/2020   CHOLHDL 3.8 10/06/2020  Lab Results  Component Value Date   HGBA1C 5.1 10/05/2020    Assessment & Plan    1.  Non-STEMI, subsequent episode of care/coronary artery disease: Patient recently hospitalized secondary to chest pain and troponin elevation to 144.  This occurred in the setting of COVID-19 infection.  Prior echo showed normal LV function with severe LVH.  Diagnostic catheterization revealed moderate to severe left main, LAD, and ramus intermedius disease with recommendation for CT surgical evaluation as an outpatient.  Mr. Lasswell has not had any recurrent chest pain or dyspnea.  He was discharged home on multiple medications but currently is taking aspirin and hydralazine only as he was feeling lightheaded on everything else.  He was agreeable to resume atorvastatin and carvedilol therapy.  We will place referral for CT surgical evaluation today and patient is interested in moving forward as necessary.  2.  Essential hypertension: Historically  poorly controlled.  He is at 160/100 today on hydralazine therapy alone.  He did not feel as though he tolerated all of the medications we had discharged him home on previously.  He is willing to resume carvedilol therapy.  For now, we will take spironolactone, lisinopril, amlodipine, and isosorbide off of his list, since has not been taking it anyway.  If pressure control remains elusive, would have a low threshold to obtain a renal arterial duplex.  3.  Hyperlipidemia: LDL of 78.  He is willing to resume statin therapy.  4.  Tobacco abuse: Previously smoked heavily.  Has smoked 6 cigarettes since discharge.  He says he is trying to quit.  I encouraged him to avoid all tobacco products.  5.  Right-sided weakness with nerve sheath tumor: Patient follows [facility] with [facility]surgery [facility].  6.  Severe LVH: In the setting of hypertension.  Patient does have a systolic murmur in the absence of significant valvular disease.  Euvolemic on examination.  Working on blood pressure control-willing to resume carvedilol.  7.  Disposition: CT surgical referral.  Follow-up here in 1 month or sooner if necessary.  Murray Hodgkins, NP 10/13/2020, 2:19 PM

## 2020-10-13 NOTE — Patient Instructions (Addendum)
Medication Instructions:  Review Medication list and continue as directed.  *If you need a refill on your cardiac medications before your next appointment, please call your pharmacy*   Lab Work: None  If you have labs (blood work) drawn today and your tests are completely normal, you will receive your results only by: [place] (if you have MyChart) OR A paper copy in the mail If you have any lab test that is abnormal or we need to change your treatment, we will call you to review the results.   Testing/Procedures: None   Follow-Up: At [facility], you and your health needs are our priority.  As part of our continuing mission to provide you with exceptional heart care, we have created designated Provider Care Teams.  These Care Teams include your primary Cardiologist (physician) and Advanced Practice Providers (APPs -  Physician Assistants and Nurse Practitioners) who all work together to provide you with the care you need, when you need it.   Your next appointment:   1 month(s)  The format for your next appointment:   In Person  Provider:   Ida Rogue, MD   Other Instructions Referral has been placed for Cardio thoracic surgery. They should reach out to schedule this appointment.

## 2020-10-19 ENCOUNTER — Telehealth: Payer: Self-pay | Admitting: Nurse Practitioner

## 2020-10-19 NOTE — Telephone Encounter (Signed)
At recent hospital discharge, pt was Rx:  Amlodipine '10mg'$  daily Aspirin '81mg'$  daily Atorvastatin '40mg'$  daily Carvedilol 3.'125mg'$  bid Hydralazine '100mg'$  TID Imdur '60mg'$  daily Lisinopril '40mg'$  daily Spironolactone '25mg'$  daily Ntg sl prn  At f/u on 7/26, he reported that he was only taking hydralazine and ASA.  I advised that at a minimum, he resume carvedilol and atorvastatin in addition.  Can you pls see what he is actually taking?  I agree with holding the hydralazine.  Once we know just what he's taking, we can make further recs.

## 2020-10-19 NOTE — Telephone Encounter (Signed)
Pt c/o medication issue:  1. Name of Medication: HydrAlazine  2. How are you currently taking this medication (dosage and times per day)? 1 tablet 3 times a day  3. Are you having a reaction (difficulty breathing--STAT)? Hot cold to sweat, difficulty breathing   4. What is your medication issue? Stop taking feeling better, is scared to take.

## 2020-10-19 NOTE — Telephone Encounter (Signed)
Spoke with the patient. Patient was seen by Ignacia Bayley, NP on 10/13/20 and started on hydralazine.  Patient sts that he is not able to tolerate hydralazine. Patient reports taking his first dosage oh hydralazine yesterday, shortly after he became diaphoretic and had difficulty breathing. Symptoms resolved after a few hours. He has not taken any more of the medication.  Patient is currently asymptomatic. He has taken his medications today except the hydralazine. He does not have a BP machine an is unable to provide BP readings. Adv the patient to HOLD hydralazine and that I will fwd the update to Ignacia Bayley, NP for further recommendation. Patient verbalized understanding.

## 2020-10-20 NOTE — Telephone Encounter (Signed)
Attempted to call pt. No answer. Lmtcb.  

## 2020-10-21 NOTE — Telephone Encounter (Signed)
Attempted to call the patient. No answer- I left a message to please call back.  

## 2020-10-21 NOTE — Telephone Encounter (Signed)
Called patient and left a VM to call us back.

## 2020-10-21 NOTE — Telephone Encounter (Signed)
Patient returning call.

## 2020-10-22 ENCOUNTER — Encounter: Payer: Self-pay | Admitting: Thoracic Surgery (Cardiothoracic Vascular Surgery)

## 2020-10-22 ENCOUNTER — Emergency Department
Admission: EM | Admit: 2020-10-22 | Discharge: 2020-10-22 | Disposition: A | Discharge status: Home / Self Care | Payer: Self-pay | Attending: Emergency Medicine | Admitting: Emergency Medicine

## 2020-10-22 ENCOUNTER — Institutional Professional Consult (permissible substitution): Payer: Self-pay | Admitting: Thoracic Surgery (Cardiothoracic Vascular Surgery)

## 2020-10-22 ENCOUNTER — Other Ambulatory Visit: Payer: Self-pay

## 2020-10-22 DIAGNOSIS — I10 Essential (primary) hypertension: Secondary | ICD-10-CM | POA: Insufficient documentation

## 2020-10-22 DIAGNOSIS — I2510 Atherosclerotic heart disease of native coronary artery without angina pectoris: Secondary | ICD-10-CM | POA: Insufficient documentation

## 2020-10-22 DIAGNOSIS — Z7982 Long term (current) use of aspirin: Secondary | ICD-10-CM | POA: Insufficient documentation

## 2020-10-22 DIAGNOSIS — Z951 Presence of aortocoronary bypass graft: Secondary | ICD-10-CM | POA: Insufficient documentation

## 2020-10-22 DIAGNOSIS — F17210 Nicotine dependence, cigarettes, uncomplicated: Secondary | ICD-10-CM | POA: Insufficient documentation

## 2020-10-22 DIAGNOSIS — Z8616 Personal history of COVID-19: Secondary | ICD-10-CM | POA: Insufficient documentation

## 2020-10-22 DIAGNOSIS — I25119 Atherosclerotic heart disease of native coronary artery with unspecified angina pectoris: Secondary | ICD-10-CM

## 2020-10-22 DIAGNOSIS — I214 Non-ST elevation (NSTEMI) myocardial infarction: Secondary | ICD-10-CM | POA: Insufficient documentation

## 2020-10-22 DIAGNOSIS — Z79899 Other long term (current) drug therapy: Secondary | ICD-10-CM | POA: Insufficient documentation

## 2020-10-22 LAB — BASIC METABOLIC PANEL
Anion gap: 8 (ref 5–15)
BUN: 17 mg/dL (ref 8–23)
CO2: 25 mmol/L (ref 22–32)
Calcium: 9 mg/dL (ref 8.9–10.3)
Chloride: 108 mmol/L (ref 98–111)
Creatinine, Ser: 1.17 mg/dL (ref 0.61–1.24)
GFR, Estimated: >60 mL/min (ref >60)
Glucose, Bld: 150 mg/dL — ABNORMAL HIGH (ref 70–99)
Potassium: 3.6 mmol/L (ref 3.5–5.1)
Sodium: 141 mmol/L (ref 135–145)

## 2020-10-22 LAB — CBC
HCT: 39.5 % (ref 39.0–52.0)
Hemoglobin: 13.6 g/dL (ref 13.0–17.0)
MCH: 31.9 pg (ref 26.0–34.0)
MCHC: 34.4 g/dL (ref 30.0–36.0)
MCV: 92.7 fL (ref 80.0–100.0)
Platelets: 192 10*3/uL (ref 150–400)
RBC: 4.26 MIL/uL (ref 4.22–5.81)
RDW: 12.7 % (ref 11.5–15.5)
WBC: 6.2 10*3/uL (ref 4.0–10.5)
nRBC: 0 % (ref 0.0–0.2)

## 2020-10-22 LAB — TROPONIN I (HIGH SENSITIVITY)
Troponin I (High Sensitivity): 56 ng/L — ABNORMAL HIGH (ref <18)
Troponin I (High Sensitivity): 48 ng/L — ABNORMAL HIGH (ref <18)

## 2020-10-22 LAB — BRAIN NATRIURETIC PEPTIDE: B Natriuretic Peptide: 296.5 pg/mL — ABNORMAL HIGH (ref 0.0–100.0)

## 2020-10-22 MED ORDER — HYDRALAZINE HCL 50 MG PO TABS
50.0000 mg | ORAL_TABLET | Freq: Once | ORAL | Status: AC
  Administered 2020-10-22: 50 mg via ORAL
  Filled 2020-10-22: qty 1

## 2020-10-22 NOTE — Telephone Encounter (Signed)
Patient was evaluated by Dr. Roxan Hockey with [facility] this afternoon for initial consult of bypass.  BP at that visit was 206/89.  He had been noncompliant with his blood pressure medication.  He was advised to go to the ED, and he agreed to do so.  At time of reviewing his chart, he has not checked into the ED yet.  Without adequate control of his blood pressure, [facility] will be unable to proceed with bypass.  Recommendations: -Agree with [facility] recommendation for patient to proceed to the ED, please contact the patient to ensure he has done so -Recommend compliance with antihypertensive medications -Bradycardia precludes titration of carvedilol -If he needs refills on his medications please let me know, otherwise he should resume medications as listed at hospital discharge outside of hydralazine, which he did not tolerate.    These would include: -Amlodipine 10 mg daily -Carvedilol 3.125 mg twice daily -Imdur 60 mg daily -Lisinopril 40 mg daily -Spironolactone 25 mg daily -SL NTG as needed -ASA 81 mg daily -Atorvastatin 40 mg daily

## 2020-10-22 NOTE — Discharge Instructions (Addendum)
As we discussed, please cut your hydralazine tablets in half so you are taking 50 mg per dose, and take this 3 times daily.  Continue all of your other prescription medications.  And follow-up with your cardiologist to discuss your upcoming CABG.  Return to the ED with any chest pains, passing out or further worsening blood pressure.

## 2020-10-22 NOTE — ED Notes (Signed)
Discussed pt with Dr Jacqualine Code, orders placed. Agreed acuity 3.  Pt denies pain at this time

## 2020-10-22 NOTE — ED Triage Notes (Signed)
Pt to ED sent from heart clinic at [facility] for hypertension. Pt states need bp controlled before open heart surgery on Wednesday.  Pt in NAD Denies pain Reports compliant with bp meds except quit taking hydralazine, did not like side effects  Reports 5 blocked arteries

## 2020-10-22 NOTE — H&P (View-Only) (Signed)
PCP is [facility], [facility] Referring Provider is [facility], [place] Complaint  Patient presents with   Coronary Artery Disease    New patient consultation, CABG Eval, ECHO 10/02/20, CTA chest 09/18/20, CATH 10/06/20    HPI: Miguel Fletcher is sent for consultation regarding coronary artery disease.  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  He has had a mass in his right axilla for "4 to 5 years.  Intermittently he gets numbness and and/or tingling in his fingers associated with that.  About 3 weeks ago he was having a lot of pain in the axilla and went to the emergency room at [facility].  A cardiac work-up was initiated.  He was severely hypertensive.  A CT angiogram showed no evidence of aortic dissection.  His high-sensitivity troponin was 75.  He refused to stay for further testing.  He went back to the emergency room a few days later saying he just did not feel well.  When asked further he said he was having a lot of indigestion and felt like he had to belch.  He did get some relief with belching but it was incomplete.  He was found to have an elevated troponin of 144.  He underwent cardiac catheterization which showed a 70% left main stenosis and moderate three-vessel disease.  Of note he had tested positive for COVID during his first hospital admission.  He was prescribed Coreg 3.125 mg twice daily and hydralazine 100 mg 3 times daily.  He also was put on Lipitor 40 mg daily and 81 mg aspirin a day.  He has not been taking his hydralazine because he says it makes him feel dizzy.  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 08/2020 MRI [facility]): Well-circumscribed T1 hypointense, T2 hyperintense enhancing mass within the right axilla measuring 3.1 x 2.6 x 3.0 cm.  Mass extends along the  course of the axillary neurovascular structures.  No evidence for invasion into the adjacent musculature.  No enlarged axillary lymph nodes.   Bell's palsy    Biliary colic    CAD (coronary artery disease)    a. 09/2020 NSTEMI/Cath: LM 60-70d, LAD 70p, D2 60, RI 70p, LCX ild diff dzs, RCA 40-31m RPDA 50, RPAV 50--> CT surgical eval.   Diastolic dysfunction    a. 09/2020 Echo: EF 60-65%, no rwma, sev LVH. Gr1 DD. Nl RV size/fxn. Mod dil LA.   Hemorrhoids    Hypercholesterolemia    Hypertension    Stroke (cerebrum) ([place]    Tobacco use     Past Surgical History:  Procedure Laterality Date   INTRAVASCULAR ULTRASOUND/IVUS N/A 10/06/2020   Procedure: Intravascular Ultrasound/IVUS;  Surgeon: [name] MD;  [place] [facility];  Service: Cardiovascular;  Laterality: N/A;   LEFT HEART CATH AND CORONARY ANGIOGRAPHY N/A 10/06/2020   Procedure: LEFT HEART CATH AND CORONARY ANGIOGRAPHY;  Surgeon: [name] MD;  [facility];  Service: Cardiovascular;  Laterality: N/A;   VARICOSE VEIN SURGERY Left     Family History  Problem Relation Age of Onset   Alzheimer's disease Father    Heart disease Father    CAD Neg Hx     Social History Social History   Tobacco Use   Smoking status: Every Day    Packs/day: 2.00  Years: 25.00    Pack years: 50.00    Types: Cigarettes   Smokeless tobacco: Never  Vaping Use   Vaping Use: Never used  Substance Use Topics   Alcohol use: No   Drug use: No    Current Outpatient Medications  Medication Sig Dispense Refill   aspirin EC 81 MG tablet Take 1 tablet (81 mg total) by mouth daily. 30 tablet 11   atorvastatin (LIPITOR) 40 MG tablet Take 40 mg by mouth daily.     carvedilol (COREG) 3.125 MG tablet Take 1 tablet (3.125 mg total) by mouth 2 (two) times daily with a meal. 60 tablet 1   hydrALAZINE (APRESOLINE) 100 MG tablet Take 1 tablet (100 mg total) by mouth 3 (three) times daily. 90 tablet 1   nitroGLYCERIN  (NITROSTAT) 0.4 MG SL tablet Place 1 tablet (0.4 mg total) under the tongue every 5 (five) minutes as needed for chest pain. 5 tablet 0   No current facility-administered medications for this visit.    No Known Allergies  Review of Systems  Constitutional:  Positive for fatigue. Negative for activity change, appetite change and unexpected weight change.  HENT:  Negative for trouble swallowing and voice change.   Respiratory:  Positive for cough. Negative for shortness of breath and wheezing.   Cardiovascular:  Positive for chest pain ("Indigestion" relieved by burping).  Gastrointestinal:  Positive for abdominal pain ("Indigestion").  Genitourinary:  Negative for difficulty urinating and dysuria.  Musculoskeletal:  Positive for arthralgias.  Neurological:  Positive for numbness. Negative for syncope, speech difficulty and weakness.  All other systems reviewed and are negative.  Ht '5\' 8"'$  (1.727 m)   BMI 32.08 kg/m  Physical Exam Vitals reviewed.  Constitutional:      General: He is not in acute distress. HENT:     Head: Normocephalic and atraumatic.  Eyes:     General: No scleral icterus.    Extraocular Movements: Extraocular movements intact.  Neck:     Vascular: No carotid bruit.  Cardiovascular:     Rate and Rhythm: Normal rate and regular rhythm.     Heart sounds: Normal heart sounds. No murmur heard.   No friction rub. No gallop.  Pulmonary:     Effort: Pulmonary effort is normal. No respiratory distress.     Breath sounds: Wheezing present.  Abdominal:     General: There is no distension.     Palpations: Abdomen is soft.  Musculoskeletal:     Cervical back: Neck supple.     Comments: Right axillary adenopathy nontender  Skin:    General: Skin is warm and dry.  Neurological:     General: No focal deficit present.     Mental Status: He is alert and oriented to person, place, and time.     Cranial Nerves: No cranial nerve deficit.     Motor: Weakness (Right facial  droop) present.  Varicosities both lower extremities Normal Allen's test left arm  Diagnostic Tests: Echocardiogram 10/02/2020 IMPRESSIONS     1. Left ventricular ejection fraction, by estimation, is 60 to 65%. The  left ventricle has normal function. The left ventricle has no regional  wall motion abnormalities. There is severe left ventricular hypertrophy.  Left ventricular diastolic parameters   are consistent with Grade I diastolic dysfunction (impaired relaxation).   2. Right ventricular systolic function is normal. The right ventricular  size is normal.   3. Left atrial size was moderately dilated.   Cardiac catheterization 10/06/2020 Conclusion  Dist LM to Prox LAD lesion is 70% stenosed.   Ramus lesion is 70% stenosed.   2nd Diag lesion is 60% stenosed.   Mid RCA lesion is 40% stenosed.   RPDA lesion is 50% stenosed.   RPAV lesion is 50% stenosed.   1.  Moderately severe, hemodynamically significant distal left main disease extending into the proximal LAD, confirmed by intravascular ultrasound assessment with minimal lumen area approximately 4 mm. 2.  Moderately severe eccentric stenosis of the ramus intermedius branch 3.  Patent left circumflex with mild nonobstructive disease 4.  Patent RCA with mild to moderate mid vessel stenosis and moderate stenosis involving the PDA and PLA branch vessels. 5.  Systemic hypertension  I personally reviewed the cardiac catheterization images.  He has moderate left main and three-vessel disease with graftable target vessels.  CT chest shows massive left ventricular hypertrophy.  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  He now has been found to have left main and three-vessel disease in the setting of severe left ventricular  hypertrophy with uncontrolled hypertension.  He has preserved left-ventricular systolic function but grade 1 diastolic dysfunction.  Left main and three-vessel disease-no critically tight stenoses.  Would benefit from revascularization.  However I suspect his ischemic events are due to demand ischemia in the setting of remarkably severe left ventricular hypertrophy and uncontrolled hypertension with systolic blood pressures in the 200 range.  That needs to be addressed prior to bypass grafting.  Hypertension-uncontrolled.  He has not been compliant with medications.  Emphasized the importance of compliance with medications.  As his blood pressure is over 200 in the office today, I recommended to go immediately to the emergency room for acute management of his hypertension.  He agrees to do so.  Hyperlipidemia-now on Lipitor  Tobacco abuse-he says he is only smoked 4 cigarettes in the past week.  He previously had a greater than 50-pack-year history of smoking with 2 packs/day.  Emphasized the importance of complete abstinence.  I did discuss with him the proposed procedure of coronary artery bypass grafting.  He has had vein stripping in the left leg about 20 years ago at [facility].  He has some varicosities in the right calf.  Conduit may be an issue.  He does have a normal Allen's test so the left radial is an option in addition to the left mammary.  He is not diabetic so we could consider bilateral mammaries but would only do so if he shows evidence that he is going to comply with instructions.  He has not demonstrated that to this point.  I informed him of the general nature of the procedure including the need for general anesthesia, the use of cardiopulmonary bypass, the incisions to be used, the use of drainage tubes and pacing wires postoperatively, the expected hospital stay, and the overall recovery.  I informed him of the indications, risks, benefits, and alternatives.  He understands the risks  include, but are not limited to death, MI, DVT, PE, bleeding, possible need for transfusion, infection, cardiac arrhythmias, respiratory or renal failure, as well as possibility of other unforeseeable complications.  He accepts the risk and agrees to proceed.  We will tentatively plan for surgery next Wednesday, but I made clear to him that anesthesia will not put him to sleep with his blood pressure is not  under control when he presents.  Plan: Patient instructed to go to the emergency room for emergent management of uncontrolled hypertension Instructed to take his blood pressure medications as prescribed Quit smoking Vein mapping of right leg to see if there is any potential conduit Possible CABG with left radial artery and possible bilateral mammary artery on Wednesday, 10/28/2020  I spent over 75 minutes in review of records, images, and in consultation with Mr. Heninger today. Melrose Nakayama, MD [facility] [phone]

## 2020-10-22 NOTE — ED Provider Notes (Signed)
[facility] Emergency Department Provider Note ____________________________________________   Event Date/Time   First MD Initiated Contact with Patient 10/22/20 2057     (approximate)  I have reviewed the triage vital signs and the nursing notes.  HISTORY  Chief Complaint Hypertension   HPI Miguel Fletcher is a 65 y.o. malewho presents to the ED for evaluation of HTN  Chart review indicates history of HTN, HLD, stroke, ongoing tobacco abuse, chronic right axillary mass.  Moderate three-vessel CAD being evaluated by CT surgery for CABG. Looks like he saw CT surgery earlier this afternoon for CABG evaluation, was noted to be quite hypertensive in the office with systolic around [id number], so he was directed to the ED for further evaluation.  Patient presents to the ED asymptomatic due to his hypertension.  He reports feeling fine and has no complaints.  Denies recent chest pain, dizziness, syncopal episodes, shortness of breath.   He reports that his hydralazine 100 mg 3 times daily causes him to feel dizzy and so he does not take it, but he is compliant with his other medications.  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 08/2020 MRI [facility]): Well-circumscribed T1 hypointense, T2 hyperintense enhancing mass within the right axilla measuring 3.1 x 2.6 x 3.0 cm.  Mass extends along the course of the axillary neurovascular structures.  No evidence for invasion into the adjacent musculature.  No enlarged axillary lymph nodes.   Bell's palsy    Biliary colic    CAD (coronary artery disease)    a. 09/2020 NSTEMI/Cath: LM 60-70d, LAD 70p, D2 60, RI 70p, LCX ild diff dzs, RCA 40-56m RPDA 50, RPAV 50--> CT surgical eval.   Diastolic dysfunction    a. 09/2020 Echo: EF 60-65%, no rwma, sev LVH. Gr1 DD. Nl RV size/fxn. Mod dil LA.   Hemorrhoids    Hypercholesterolemia    Hypertension    Stroke (cerebrum) ([facility]    Tobacco use      Patient Active Problem List   Diagnosis Date Noted   CAD (coronary artery disease) 10/22/2020   NSTEMI (non-ST elevated myocardial infarction) ([facility] 10/05/2020   HTN (hypertension) 10/05/2020   HLD (hyperlipidemia) 10/05/2020   Stroke ([facility] 10/05/2020   Tobacco use 10/05/2020   COVID-19 virus infection 10/05/2020   Hypokalemia 10/01/2020   Essential hypertension 10/01/2020   Axillary mass, right 10/01/2020   Chest pain 03/19/2016    Past Surgical History:  Procedure Laterality Date   INTRAVASCULAR ULTRASOUND/IVUS N/A 10/06/2020   Procedure: Intravascular Ultrasound/IVUS;  Surgeon: [name] MD;  [facility];  Service: Cardiovascular;  Laterality: N/A;   LEFT HEART CATH AND CORONARY ANGIOGRAPHY N/A 10/06/2020   Procedure: LEFT HEART CATH AND CORONARY ANGIOGRAPHY;  Surgeon: [name] MD;  [place] [facility];  Service: Cardiovascular;  Laterality: N/A;   VARICOSE VEIN SURGERY Left     Prior to Admission medications   Medication Sig Start Date End Date Taking? Authorizing Provider  aspirin EC 81 MG tablet Take 1 tablet (81 mg total) by mouth daily. 10/07/20   GPatrecia Pour MD  atorvastatin (LIPITOR) 40 MG tablet Take 40 mg by mouth daily. Patient not taking: Reported on 10/22/2020    [provider]  carvedilol (COREG) 3.125 MG tablet Take 1 tablet (3.125 mg total) by mouth 2 (two) times daily with a meal. 10/07/20   GPatrecia Pour MD  hydrALAZINE (APRESOLINE) 100 MG tablet  Take 1 tablet (100 mg total) by mouth 3 (three) times daily. 10/07/20   Patrecia Pour, MD  nitroGLYCERIN (NITROSTAT) 0.4 MG SL tablet Place 1 tablet (0.4 mg total) under the tongue every 5 (five) minutes as needed for chest pain. 10/07/20   Patrecia Pour, MD    Allergies Patient has no known allergies.  Family History  Problem Relation Age of Onset   Alzheimer's disease Father    Heart disease Father    CAD Neg Hx     Social History Social History    Tobacco Use   Smoking status: Some Days    Packs/day: 2.00    Years: 25.00    Pack years: 50.00    Types: Cigarettes   Smokeless tobacco: Never   Tobacco comments:    "I have smoked 4 cigs this week."  Vaping Use   Vaping Use: Never used  Substance Use Topics   Alcohol use: No   Drug use: No    Review of Systems  Constitutional: No fever/chills Eyes: No visual changes. ENT: No sore throat. Cardiovascular: Denies chest pain. Respiratory: Denies shortness of breath. Gastrointestinal: No abdominal pain.  No nausea, no vomiting.  No diarrhea.  No constipation. Genitourinary: Negative for dysuria. Musculoskeletal: Negative for back pain. Skin: Negative for rash. Neurological: Negative for headaches, focal weakness or numbness.  ____________________________________________   PHYSICAL EXAM:  VITAL SIGNS: Vitals:   10/22/20 2230 10/22/20 2245  BP: (!) 202/83 (!) 191/85  Pulse:    Resp: (!) 21 20  Temp:    SpO2:        Constitutional: Alert and oriented. Well appearing and in no acute distress. Eyes: Conjunctivae are normal. PERRL. EOMI. Head: Atraumatic. Nose: No congestion/rhinnorhea. Mouth/Throat: Mucous membranes are moist.  Oropharynx non-erythematous. Neck: No stridor. No cervical spine tenderness to palpation. Cardiovascular: Normal rate, regular rhythm. Grossly normal heart sounds.  Good peripheral circulation. Respiratory: Normal respiratory effort.  No retractions. Lungs CTAB. Gastrointestinal: Soft , nondistended, nontender to palpation. No CVA tenderness. Musculoskeletal: No lower extremity tenderness nor edema.  No joint effusions. No signs of acute trauma. Neurologic:  Normal speech and language. No gross focal neurologic deficits are appreciated.  Cranial nerves II through XII intact 5/5 strength and sensation in all 4 extremities Skin:  Skin is warm, dry and intact. No rash noted. Psychiatric: Mood and affect are normal. Speech and behavior are  normal.  ____________________________________________   LABS (all labs ordered are listed, but only abnormal results are displayed)  Labs Reviewed  BASIC METABOLIC PANEL - Abnormal; Notable for the following components:      Result Value   Glucose, Bld 150 (*)    All other components within normal limits  BRAIN NATRIURETIC PEPTIDE - Abnormal; Notable for the following components:   B Natriuretic Peptide 296.5 (*)    All other components within normal limits  TROPONIN I (HIGH SENSITIVITY) - Abnormal; Notable for the following components:   Troponin I (High Sensitivity) 56 (*)    All other components within normal limits  TROPONIN I (HIGH SENSITIVITY) - Abnormal; Notable for the following components:   Troponin I (High Sensitivity) 48 (*)    All other components within normal limits  CBC   ____________________________________________  12 Lead EKG  Sinus rhythm, rate of 56 bpm.  Leftward axis and incomplete left bundle morphology.  Stigmata of LVH.  Lateral and inferior nonspecific ST changes with T wave inversions and biphasic T waves.  There is similar from EKG 2  weeks ago. ____________________________________________  [place]  ED MD interpretation:    Official radiology report(s): No results found.  ____________________________________________   PROCEDURES and INTERVENTIONS  Procedure(s) performed (including Critical Care):  .1-3 Lead EKG Interpretation  Date/Time: 10/22/2020 11:08 PM Performed by: Vladimir Crofts, MD Authorized by: Vladimir Crofts, MD     Interpretation: normal     ECG rate:  60   ECG rate assessment: normal     Rhythm: sinus rhythm     Ectopy: none     Conduction: normal    Medications  hydrALAZINE (APRESOLINE) tablet 50 mg (50 mg Oral Given 10/22/20 2145)   ____________________________________________   MDM / ED COURSE   65 year old male with longstanding poorly controlled hypertension presents to the ED with asymptomatic hypertension,  without evidence of ACS or acute derangements, amenable to outpatient management.  Exam reassuring without evidence of neurologic or vascular deficits.  He looks well without complaints.  Work-up is benign.  EKG is similar with chronic nonspecific changes.  He reports that he has not taken his hydralazine at home due to side effects.  We discussed cutting this dose in half and discussed the importance of adherence to his medication regimen.  He tolerates the half dose here in the ED without any reported side effects and his blood pressure slowly improving.  No barriers to outpatient management at this time.  We will discharge with return precautions.  Clinical Course as of 10/22/20 2308  Thu Oct 22, 2020  2128 BP 181/77. Pt asymptomatic and requesting discharge. We discussed pending trop [DS]  2230 Reassessed.  Still asymptomatic.  BP little higher with systolic right at A999333 [DS]  2259 Reassessed. BP improving and he remains asymptomatic. We discussed his BP regimen and return precautions [DS]    Clinical Course User Index [DS] Vladimir Crofts, MD    ____________________________________________   FINAL CLINICAL IMPRESSION(S) / ED DIAGNOSES  Final diagnoses:  Primary hypertension  Asymptomatic hypertension     ED Discharge Orders     None        Narelle Schoening Tamala Julian   Note:  This document was prepared using Dragon voice recognition software and may include unintentional dictation errors.    Vladimir Crofts, MD 10/22/20 [phone]

## 2020-10-22 NOTE — Progress Notes (Signed)
PCP is [facility] Referring Provider is [facility], [place] Complaint  Patient presents with   Coronary Artery Disease    New patient consultation, CABG Eval, ECHO 10/02/20, CTA chest 09/18/20, CATH 10/06/20    HPI: Miguel Fletcher is sent for consultation regarding coronary artery disease.  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  He has had a mass in his right axilla for "4 to 5 years.  Intermittently he gets numbness and and/or tingling in his fingers associated with that.  About 3 weeks ago he was having a lot of pain in the axilla and went to the emergency room at [facility].  A cardiac work-up was initiated.  He was severely hypertensive.  A CT angiogram showed no evidence of aortic dissection.  His high-sensitivity troponin was 75.  He refused to stay for further testing.  He went back to the emergency room a few days later saying he just did not feel well.  When asked further he said he was having a lot of indigestion and felt like he had to belch.  He did get some relief with belching but it was incomplete.  He was found to have an elevated troponin of 144.  He underwent cardiac catheterization which showed a 70% left main stenosis and moderate three-vessel disease.  Of note he had tested positive for COVID during his first hospital admission.  He was prescribed Coreg 3.125 mg twice daily and hydralazine 100 mg 3 times daily.  He also was put on Lipitor 40 mg daily and 81 mg aspirin a day.  He has not been taking his hydralazine because he says it makes him feel dizzy.  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 08/2020 MRI [facility]): Well-circumscribed T1 hypointense, T2 hyperintense enhancing mass within the right axilla measuring 3.1 x 2.6 x 3.0 cm.  Mass extends along the  course of the axillary neurovascular structures.  No evidence for invasion into the adjacent musculature.  No enlarged axillary lymph nodes.   Bell's palsy    Biliary colic    CAD (coronary artery disease)    a. 09/2020 NSTEMI/Cath: LM 60-70d, LAD 70p, D2 60, RI 70p, LCX ild diff dzs, RCA 40-21m RPDA 50, RPAV 50--> CT surgical eval.   Diastolic dysfunction    a. 09/2020 Echo: EF 60-65%, no rwma, sev LVH. Gr1 DD. Nl RV size/fxn. Mod dil LA.   Hemorrhoids    Hypercholesterolemia    Hypertension    Stroke (cerebrum) (HTaylor    Tobacco use     Past Surgical History:  Procedure Laterality Date   INTRAVASCULAR ULTRASOUND/IVUS N/A 10/06/2020   Procedure: Intravascular Ultrasound/IVUS;  Surgeon: [name] MD;  [facility];  Service: Cardiovascular;  Laterality: N/A;   LEFT HEART CATH AND CORONARY ANGIOGRAPHY N/A 10/06/2020   Procedure: LEFT HEART CATH AND CORONARY ANGIOGRAPHY;  Surgeon: [name] MD;  [facility];  Service: Cardiovascular;  Laterality: N/A;   VARICOSE VEIN SURGERY Left     Family History  Problem Relation Age of Onset   Alzheimer's disease Father    Heart disease Father    CAD Neg Hx     Social History Social History   Tobacco Use   Smoking status: Every Day    Packs/day: 2.00  Years: 25.00    Pack years: 50.00    Types: Cigarettes   Smokeless tobacco: Never  Vaping Use   Vaping Use: Never used  Substance Use Topics   Alcohol use: No   Drug use: No    Current Outpatient Medications  Medication Sig Dispense Refill   aspirin EC 81 MG tablet Take 1 tablet (81 mg total) by mouth daily. 30 tablet 11   atorvastatin (LIPITOR) 40 MG tablet Take 40 mg by mouth daily.     carvedilol (COREG) 3.125 MG tablet Take 1 tablet (3.125 mg total) by mouth 2 (two) times daily with a meal. 60 tablet 1   hydrALAZINE (APRESOLINE) 100 MG tablet Take 1 tablet (100 mg total) by mouth 3 (three) times daily. 90 tablet 1   nitroGLYCERIN  (NITROSTAT) 0.4 MG SL tablet Place 1 tablet (0.4 mg total) under the tongue every 5 (five) minutes as needed for chest pain. 5 tablet 0   No current facility-administered medications for this visit.    No Known Allergies  Review of Systems  Constitutional:  Positive for fatigue. Negative for activity change, appetite change and unexpected weight change.  HENT:  Negative for trouble swallowing and voice change.   Respiratory:  Positive for cough. Negative for shortness of breath and wheezing.   Cardiovascular:  Positive for chest pain ("Indigestion" relieved by burping).  Gastrointestinal:  Positive for abdominal pain ("Indigestion").  Genitourinary:  Negative for difficulty urinating and dysuria.  Musculoskeletal:  Positive for arthralgias.  Neurological:  Positive for numbness. Negative for syncope, speech difficulty and weakness.  All other systems reviewed and are negative.  Ht '5\' 8"'$  (1.727 m)   BMI 32.08 kg/m  Physical Exam Vitals reviewed.  Constitutional:      General: He is not in acute distress. HENT:     Head: Normocephalic and atraumatic.  Eyes:     General: No scleral icterus.    Extraocular Movements: Extraocular movements intact.  Neck:     Vascular: No carotid bruit.  Cardiovascular:     Rate and Rhythm: Normal rate and regular rhythm.     Heart sounds: Normal heart sounds. No murmur heard.   No friction rub. No gallop.  Pulmonary:     Effort: Pulmonary effort is normal. No respiratory distress.     Breath sounds: Wheezing present.  Abdominal:     General: There is no distension.     Palpations: Abdomen is soft.  Musculoskeletal:     Cervical back: Neck supple.     Comments: Right axillary adenopathy nontender  Skin:    General: Skin is warm and dry.  Neurological:     General: No focal deficit present.     Mental Status: He is alert and oriented to person, place, and time.     Cranial Nerves: No cranial nerve deficit.     Motor: Weakness (Right facial  droop) present.  Varicosities both lower extremities Normal Allen's test left arm  Diagnostic Tests: Echocardiogram 10/02/2020 IMPRESSIONS     1. Left ventricular ejection fraction, by estimation, is 60 to 65%. The  left ventricle has normal function. The left ventricle has no regional  wall motion abnormalities. There is severe left ventricular hypertrophy.  Left ventricular diastolic parameters   are consistent with Grade I diastolic dysfunction (impaired relaxation).   2. Right ventricular systolic function is normal. The right ventricular  size is normal.   3. Left atrial size was moderately dilated.   Cardiac catheterization 10/06/2020 Conclusion  Dist LM to Prox LAD lesion is 70% stenosed.   Ramus lesion is 70% stenosed.   2nd Diag lesion is 60% stenosed.   Mid RCA lesion is 40% stenosed.   RPDA lesion is 50% stenosed.   RPAV lesion is 50% stenosed.   1.  Moderately severe, hemodynamically significant distal left main disease extending into the proximal LAD, confirmed by intravascular ultrasound assessment with minimal lumen area approximately 4 mm. 2.  Moderately severe eccentric stenosis of the ramus intermedius branch 3.  Patent left circumflex with mild nonobstructive disease 4.  Patent RCA with mild to moderate mid vessel stenosis and moderate stenosis involving the PDA and PLA branch vessels. 5.  Systemic hypertension  I personally reviewed the cardiac catheterization images.  He has moderate left main and three-vessel disease with graftable target vessels.  CT chest shows massive left ventricular hypertrophy.  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  He now has been found to have left main and three-vessel disease in the setting of severe left ventricular  hypertrophy with uncontrolled hypertension.  He has preserved left-ventricular systolic function but grade 1 diastolic dysfunction.  Left main and three-vessel disease-no critically tight stenoses.  Would benefit from revascularization.  However I suspect his ischemic events are due to demand ischemia in the setting of remarkably severe left ventricular hypertrophy and uncontrolled hypertension with systolic blood pressures in the 200 range.  That needs to be addressed prior to bypass grafting.  Hypertension-uncontrolled.  He has not been compliant with medications.  Emphasized the importance of compliance with medications.  As his blood pressure is over 200 in the office today, I recommended to go immediately to the emergency room for acute management of his hypertension.  He agrees to do so.  Hyperlipidemia-now on Lipitor  Tobacco abuse-he says he is only smoked 4 cigarettes in the past week.  He previously had a greater than 50-pack-year history of smoking with 2 packs/day.  Emphasized the importance of complete abstinence.  I did discuss with him the proposed procedure of coronary artery bypass grafting.  He has had vein stripping in the left leg about 20 years ago at [facility].  He has some varicosities in the right calf.  Conduit may be an issue.  He does have a normal Allen's test so the left radial is an option in addition to the left mammary.  He is not diabetic so we could consider bilateral mammaries but would only do so if he shows evidence that he is going to comply with instructions.  He has not demonstrated that to this point.  I informed him of the general nature of the procedure including the need for general anesthesia, the use of cardiopulmonary bypass, the incisions to be used, the use of drainage tubes and pacing wires postoperatively, the expected hospital stay, and the overall recovery.  I informed him of the indications, risks, benefits, and alternatives.  He understands the risks  include, but are not limited to death, MI, DVT, PE, bleeding, possible need for transfusion, infection, cardiac arrhythmias, respiratory or renal failure, as well as possibility of other unforeseeable complications.  He accepts the risk and agrees to proceed.  We will tentatively plan for surgery next Wednesday, but I made clear to him that anesthesia will not put him to sleep with his blood pressure is not  under control when he presents.  Plan: Patient instructed to go to the emergency room for emergent management of uncontrolled hypertension Instructed to take his blood pressure medications as prescribed Quit smoking Vein mapping of right leg to see if there is any potential conduit Possible CABG with left radial artery and possible bilateral mammary artery on Wednesday, 10/28/2020  I spent over 75 minutes in review of records, images, and in consultation with Mr. Capel today. Melrose Nakayama, MD [facility] [phone]

## 2020-10-22 NOTE — ED Provider Notes (Signed)
Patient's triage EKG is reviewed, no immediate life threat noted.  Triage advises patient referred here for blood pressure control otherwise asymptomatic with no chest pain.  Awaiting cardiac panel and BNP.  Patient's EKG does have concerning T wave abnormalities lateral, but not consistent with STEMI.  Comparison to previous EKG shows somewhat similar morphology perhaps slightly more pronounced in lateral T waves now.  Do not see morphology to indicate acute STEMI however   Delman Kitten, MD 10/22/20 1800

## 2020-10-22 NOTE — Telephone Encounter (Signed)
I was able to speak with the patient today. I advised we were calling to clarify what medications he was taking per Ignacia Bayley, NP.  Per the patient, he has not taken anymore hydralazine at this caused him to have a headache/ feel dizzy & diaphoretic.  "I felt like I was in [place] and didn't know where I was going."  The patient does confirm that he is taking: ASA 81 mg once daily  Atorvastatin 40 mg once daily  Carvedilol 3.125 mg BID NTG 0.4 mg SL as needed (but not had to use these)  The patient states he feels "great" since stopping hydralazine. He has a consult appointment this afternoon to discuss CABG with Dr. Roxan Hockey. He is scheduled to see Ignacia Bayley, NP on 11/09/20.  I have advised the patient that the appointment with our office may possibly change depending on his discussion with Dr. Roxan Hockey this afternoon.  The patient is aware I will update Gerald Stabs of his medications and call back with any further recommendations.  I have confirmed with patient that he does not have a BP cuff at home and he advised he cannot afford this as he is currently out of work due to his medical condition.  I have advised the patient that when he is in our office next, to remind [place], and we can provide him with a BP cuff for home.  The patient voices understanding of the above and is agreeable.

## 2020-10-23 ENCOUNTER — Other Ambulatory Visit: Payer: Self-pay | Admitting: *Deleted

## 2020-10-23 ENCOUNTER — Ambulatory Visit: Payer: Self-pay | Admitting: *Deleted

## 2020-10-23 ENCOUNTER — Encounter: Payer: Self-pay | Admitting: *Deleted

## 2020-10-23 VITALS — BP 171/81 | Resp 20 | Ht 68.0 in | Wt 215.5 lb

## 2020-10-23 DIAGNOSIS — I25119 Atherosclerotic heart disease of native coronary artery with unspecified angina pectoris: Secondary | ICD-10-CM

## 2020-10-23 DIAGNOSIS — I2510 Atherosclerotic heart disease of native coronary artery without angina pectoris: Secondary | ICD-10-CM

## 2020-10-23 DIAGNOSIS — I10 Essential (primary) hypertension: Secondary | ICD-10-CM

## 2020-10-23 MED ORDER — HYDRALAZINE HCL 100 MG PO TABS: 50.0000 mg | ORAL_TABLET | Freq: Three times a day (TID) | ORAL | 1 refills | Status: DC

## 2020-10-23 MED ORDER — ISOSORBIDE MONONITRATE ER 60 MG PO TB24: 60.0000 mg | ORAL_TABLET | Freq: Every day | ORAL | 3 refills | Status: DC

## 2020-10-23 MED ORDER — AMLODIPINE BESYLATE 10 MG PO TABS: 10.0000 mg | ORAL_TABLET | Freq: Every day | ORAL | 3 refills | Status: DC

## 2020-10-23 MED ORDER — LISINOPRIL 40 MG PO TABS: 40.0000 mg | ORAL_TABLET | Freq: Every day | ORAL | 3 refills | Status: DC

## 2020-10-23 NOTE — Telephone Encounter (Signed)
Nurse visit completed and sent to provider.

## 2020-10-23 NOTE — Telephone Encounter (Signed)
I spoke with the patient this morning. He did go to the ER yesterday evening for his elevated BP.   He advised he was given hydralazine 50 mg x 1 dose and his BP started to come down. He advised he tolerated this dose so much better.  He did take another 50 mg of hydralazine this morning at 8:00 am and is still feeling ok.  I advised him of the message I had received from Christell Faith, [place]. I inquired if the patient had the medications listed below at home. He did pull all of his medication bottles and we went over these.  Currently he has bottles of: 1) Amlodipine 10 mg once daily 2) Atorvastatin 40 mg once daily (he is taking this) 3) ASA 81 mg once daily (he is taking this) 4) Carvedilol 3.125 mg BID (he is taking this) 5) Hydralazine 100 mg TID 6) Hydralazine 25 mg TID --he advised this must be old because the tablets look "dusty" 7) Isosorbide 60 mg once daily 8) Lisinopril 40 mg once daily 9) Lisinopril 10 mg once daily 10) NTG 0.4 mg PRN  11) Spironolactone 25 mg once daily    I have advised the patient to let me review the above with Christell Faith, PA.  He is aware I want him to come to the office today to pick up a BP cuff, but I also want to have a clarified med list printed for him to pick up with this.  The patient is aware I will call him back. He voices understanding and is agreeable.

## 2020-10-23 NOTE — Progress Notes (Signed)
Patient here today to pick up blood pressure cuff, review medications, and have his blood pressure checked. Provided patient with free blood pressure machine and educated on use with that device. Instructed him to monitor blood pressures, keep a log, and requested that he please call us on Monday with his readings.   We then reviewed medications in detail and he reported taking the following:  Outpatient and Clinic-Administered Medications  nitroGLYCERIN (NITROSTAT) 0.4 MG SL tablet 0.4 mg, Every 5 min PRN hydrALAZINE (APRESOLINE) 100 MG tablet 100 mg, 3 times daily  carvedilol (COREG) 3.125 MG tablet 3.125 mg, 2 times daily with meals  atorvastatin (LIPITOR) 40 MG tablet 40 mg, Daily    aspirin EC 81 MG tablet    We then reviewed medication changes that provider ordered and then updated his list, provided avs with changes, and also printed medication list. Patient did report taking only half tablet of the hydralazine due to how it made him feel. Confirmed dose of hydralazine with provider and updated in medication list. We discussed medications in length and confirmed understanding with him. Patient did inquire about what time he should arrive for his surgery. I will reach out to their office with request to call and update patient. Patient verbalized understanding of our conversation, instructions, and had no further questions at this time.    Updated list was as follows:  START Amlodipine 10 mg once a day START Isosorbide mononitrate (Imdur) 60 mg once a day START Lisinopril 40 mg once a day CONTINUE Carvedilol (Coreg) 3.125 twice a day CONTINUE Aspirin 81 mg once a day CONTINUE Atorvastatin (Lipitor) 40 mg once a day CONTINUE Hydralazine 100 mg and take one half tablet (50 mg) three times a day.  Rise Mu, PA-C   Physician Assistant Certified  Cardiology  Telephone Encounter  Signed  Encounter Date:  10/19/2020           Signed               BP in the ED remained elevated >  200 upon arrival and improved to the A999333 systolic on recheck. His mildly elevated and down trending HS-Tn are likely in the setting of supply demand ischemia with known CAD and poorly controlled HTN. Agree with recommendations for patient to come by the office today for a BP check and to pick up a BP cuff. Based on the past several BP readings, he will need escalation of medical therapy, based on what is is currently taking, not what he currently has.   Recommendations: -Start taking amlodipine 10 mg daily, Imdur 60 mg daily, and lisinopril 40 mg daily -Continue to take Coreg 3.125 mg bid, ASA 81 mg daily, and Lipitor 40 mg daily -Remove the 10 mg of lisinopril from his list and advise him not to take this, doses > 40 mg daily are no more effective than just 40 mg -For now, hold off on starting spironolactone until we see how his BP trends with the above   Please update me with his BP readings when he comes by the office.

## 2020-10-23 NOTE — Pre-Procedure Instructions (Signed)
Surgical Instructions    Your procedure is scheduled on Wednesday, August 10th.  Report to [facility] Main Entrance "A" at 6:30 A.M., then check in with the Admitting office.  Call this number if you have problems the morning of surgery:  [phone]   If you have any questions prior to your surgery date call [phone]: Open Monday-Friday 8am-4pm    Remember:  Do not eat or drink after midnight the night before your surgery    Take these medicines the morning of surgery with A SIP OF WATER  amLODipine (NORVASC)  atorvastatin (LIPITOR)  carvedilol (COREG) hydrALAZINE (APRESOLINE)  isosorbide mononitrate (IMDUR) nitroGLYCERIN (NITROSTAT)-if needed for chest pain. Please let a nurse know if you had to use this.  As of today, STOP taking any Aspirin (unless otherwise instructed by your surgeon) Aleve, Naproxen, Ibuprofen, Motrin, Advil, Goody's, BC's, all herbal medications, fish oil, and all vitamins.                     Do NOT Smoke (Tobacco/Vaping) or drink Alcohol 24 hours prior to your procedure.  If you use a CPAP at night, you may bring all equipment for your overnight stay.   Contacts, glasses, piercing's, hearing aid's, dentures or partials may not be worn into surgery, please bring cases for these belongings.    For patients admitted to the hospital, discharge time will be determined by your treatment team.   Patients discharged the day of surgery will not be allowed to drive home, and someone needs to stay with them for 24 hours.  ONLY 1 SUPPORT PERSON MAY BE PRESENT WHILE YOU ARE IN SURGERY. IF YOU ARE TO BE ADMITTED ONCE YOU ARE IN YOUR ROOM YOU WILL BE ALLOWED TWO (2) VISITORS.  Minor children may have two parents present. Special consideration for safety and communication needs will be reviewed on a case by case basis.   Special instructions:   [place]- Preparing For Surgery  Before surgery, you can play an important role. Because skin is not sterile, your  skin needs to be as free of germs as possible. You can reduce the number of germs on your skin by washing with CHG (chlorahexidine gluconate) Soap before surgery.  CHG is an antiseptic cleaner which kills germs and bonds with the skin to continue killing germs even after washing.    Oral Hygiene is also important to reduce your risk of infection.  Remember - BRUSH YOUR TEETH THE MORNING OF SURGERY WITH YOUR REGULAR TOOTHPASTE  Please do not use if you have an allergy to CHG or antibacterial soaps. If your skin becomes reddened/irritated stop using the CHG.  Do not shave (including legs and underarms) for at least 48 hours prior to first CHG shower. It is OK to shave your face.  Please follow these instructions carefully.   Shower the NIGHT BEFORE SURGERY and the MORNING OF SURGERY  If you chose to wash your hair, wash your hair first as usual with your normal shampoo.  After you shampoo, rinse your hair and body thoroughly to remove the shampoo.  Use CHG Soap as you would any other liquid soap. You can apply CHG directly to the skin and wash gently with a scrungie or a clean washcloth.   Apply the CHG Soap to your body ONLY FROM THE NECK DOWN.  Do not use on open wounds or open sores. Avoid contact with your eyes, ears, mouth and genitals (private parts). Wash Face and genitals (private parts)  with your normal soap.   Wash thoroughly, paying special attention to the area where your surgery will be performed.  Thoroughly rinse your body with warm water from the neck down.  DO NOT shower/wash with your normal soap after using and rinsing off the CHG Soap.  Pat yourself dry with a CLEAN TOWEL.  Wear CLEAN PAJAMAS to bed the night before surgery  Place CLEAN SHEETS on your bed the night before your surgery  DO NOT SLEEP WITH PETS.   Day of Surgery: Shower with CHG soap. Do not wear jewelry. Do not wear lotions, powders, colognes, or deodorant. Men may shave face and neck. Do not  bring valuables to the hospital. [facility] is not responsible for any belongings or valuables. Wear Clean/Comfortable clothing the morning of surgery Remember to brush your teeth WITH YOUR REGULAR TOOTHPASTE.   Please read over the following fact sheets that you were given.

## 2020-10-23 NOTE — Telephone Encounter (Signed)
Spoke with patient and he is not at home and not able to write down medication changes and instructions. Advised that we would like to check his blood pressure when he comes in to pick up machine and we can provide these instructions in writing at that time. He was agreeable with this plan and had no further questions at this time. Will notify staff up front that he will be coming in for nurse visit and to make me aware when he arrives.

## 2020-10-23 NOTE — Telephone Encounter (Signed)
BP in the ED remained elevated > 200 upon arrival and improved to the A999333 systolic on recheck. His mildly elevated and down trending HS-Tn are likely in the setting of supply demand ischemia with known CAD and poorly controlled HTN. Agree with recommendations for patient to come by the office today for a BP check and to pick up a BP cuff. Based on the past several BP readings, he will need escalation of medical therapy, based on what is is currently taking, not what he currently has.   Recommendations: -Start taking amlodipine 10 mg daily, Imdur 60 mg daily, and lisinopril 40 mg daily -Continue to take Coreg 3.125 mg bid, ASA 81 mg daily, and Lipitor 40 mg daily -Remove the 10 mg of lisinopril from his list and advise him not to take this, doses > 40 mg daily are no more effective than just 40 mg  -For now, hold off on starting spironolactone until we see how his BP trends with the above  Please update me with his BP readings when he comes by the office.

## 2020-10-23 NOTE — Patient Instructions (Addendum)
Call us on Monday with blood pressure readings   Medication Instructions:  Your physician has recommended you make the following change in your medication:   START Amlodipine 10 mg once a day START Isosorbide mononitrate (Imdur) 60 mg once a day START Lisinopril 40 mg once a day CONTINUE Carvedilol (Coreg) 3.125 twice a day CONTINUE Aspirin 81 mg once a day CONTINUE Atorvastatin (Lipitor) 40 mg once a day CONTINUE Hydralazine 100 mg and take one half tablet (50 mg) three times a day.   *If you need a refill on your cardiac medications before your next appointment, please call your pharmacy*   Lab Work: None  If you have labs (blood work) drawn today and your tests are completely normal, you will receive your results only by: [place] (if you have MyChart) OR A paper copy in the mail If you have any lab test that is abnormal or we need to change your treatment, we will call you to review the results.   Testing/Procedures: Keep scheduled procedure.    Follow-Up: At [facility], you and your health needs are our priority.  As part of our continuing mission to provide you with exceptional heart care, we have created designated Provider Care Teams.  These Care Teams include your primary Cardiologist (physician) and Advanced Practice Providers (APPs -  Physician Assistants and Nurse Practitioners) who all work together to provide you with the care you need, when you need it.   Please monitor blood pressures and keep a log of your readings.   Make sure to check 2 hours after your medications.   AVOID these things for 30 minutes before checking your blood pressure: No Drinking caffeine. No Drinking alcohol. No Eating. No Smoking. No Exercising.  Five minutes before checking your blood pressure: Pee. Sit in a dining chair. Avoid sitting in a soft couch or armchair. Be quiet. Do not talk.

## 2020-10-26 ENCOUNTER — Ambulatory Visit (HOSPITAL_BASED_OUTPATIENT_CLINIC_OR_DEPARTMENT_OTHER)
Admission: RE | Admit: 2020-10-26 | Discharge: 2020-10-26 | Disposition: A | Discharge status: Home / Self Care | Payer: Self-pay | Source: Ambulatory Visit | Attending: Thoracic Surgery (Cardiothoracic Vascular Surgery) | Admitting: Thoracic Surgery (Cardiothoracic Vascular Surgery)

## 2020-10-26 ENCOUNTER — Ambulatory Visit (HOSPITAL_COMMUNITY)
Admission: RE | Admit: 2020-10-26 | Discharge: 2020-10-26 | Disposition: A | Discharge status: Home / Self Care | Payer: Self-pay | Source: Ambulatory Visit | Attending: Thoracic Surgery (Cardiothoracic Vascular Surgery) | Admitting: Thoracic Surgery (Cardiothoracic Vascular Surgery)

## 2020-10-26 ENCOUNTER — Encounter (HOSPITAL_COMMUNITY): Payer: Self-pay

## 2020-10-26 ENCOUNTER — Other Ambulatory Visit: Payer: Self-pay

## 2020-10-26 ENCOUNTER — Encounter (HOSPITAL_COMMUNITY)
Admission: RE | Admit: 2020-10-26 | Discharge: 2020-10-26 | Disposition: A | Discharge status: Home / Self Care | Payer: Self-pay | Source: Ambulatory Visit | Attending: Thoracic Surgery (Cardiothoracic Vascular Surgery) | Admitting: Thoracic Surgery (Cardiothoracic Vascular Surgery)

## 2020-10-26 DIAGNOSIS — I25119 Atherosclerotic heart disease of native coronary artery with unspecified angina pectoris: Secondary | ICD-10-CM

## 2020-10-26 DIAGNOSIS — Z01818 Encounter for other preprocedural examination: Secondary | ICD-10-CM | POA: Insufficient documentation

## 2020-10-26 LAB — SURGICAL PCR SCREEN
MRSA, PCR: NEGATIVE
Staphylococcus aureus: NEGATIVE

## 2020-10-26 LAB — COMPREHENSIVE METABOLIC PANEL
ALT: 18 U/L (ref 0–44)
AST: 18 U/L (ref 15–41)
Albumin: 3.3 g/dL — ABNORMAL LOW (ref 3.5–5.0)
Alkaline Phosphatase: 61 U/L (ref 38–126)
Anion gap: 9 (ref 5–15)
BUN: 10 mg/dL (ref 8–23)
CO2: 24 mmol/L (ref 22–32)
Calcium: 9 mg/dL (ref 8.9–10.3)
Chloride: 106 mmol/L (ref 98–111)
Creatinine, Ser: 1.12 mg/dL (ref 0.61–1.24)
GFR, Estimated: >60 mL/min (ref >60)
Glucose, Bld: 100 mg/dL — ABNORMAL HIGH (ref 70–99)
Potassium: 3.4 mmol/L — ABNORMAL LOW (ref 3.5–5.1)
Sodium: 139 mmol/L (ref 135–145)
Total Bilirubin: 1 mg/dL (ref 0.3–1.2)
Total Protein: 6.7 g/dL (ref 6.5–8.1)

## 2020-10-26 LAB — CBC
HCT: 39.2 % (ref 39.0–52.0)
Hemoglobin: 12.9 g/dL — ABNORMAL LOW (ref 13.0–17.0)
MCH: 30.9 pg (ref 26.0–34.0)
MCHC: 32.9 g/dL (ref 30.0–36.0)
MCV: 93.8 fL (ref 80.0–100.0)
Platelets: 205 10*3/uL (ref 150–400)
RBC: 4.18 MIL/uL — ABNORMAL LOW (ref 4.22–5.81)
RDW: 12.9 % (ref 11.5–15.5)
WBC: 6.8 10*3/uL (ref 4.0–10.5)
nRBC: 0 % (ref 0.0–0.2)

## 2020-10-26 LAB — BLOOD GAS, ARTERIAL
Acid-Base Excess: 2.9 mmol/L — ABNORMAL HIGH (ref 0.0–2.0)
Bicarbonate: 26.7 mmol/L (ref 20.0–28.0)
Drawn by: 58793
FIO2: 21
O2 Saturation: 97.4 %
Patient temperature: 37
pCO2 arterial: 39.2 mmHg (ref 32.0–48.0)
pH, Arterial: 7.447 (ref 7.350–7.450)
pO2, Arterial: 89.3 mmHg (ref 83.0–108.0)

## 2020-10-26 LAB — HEMOGLOBIN A1C
Hgb A1c MFr Bld: 5.3 % (ref 4.8–5.6)
Mean Plasma Glucose: 105.41 mg/dL

## 2020-10-26 LAB — TYPE AND SCREEN
ABO/RH(D): O POS
Antibody Screen: NEGATIVE

## 2020-10-26 LAB — URINALYSIS, ROUTINE W REFLEX MICROSCOPIC
Bilirubin Urine: NEGATIVE
Glucose, UA: NEGATIVE mg/dL
Hgb urine dipstick: NEGATIVE
Ketones, ur: NEGATIVE mg/dL
Leukocytes,Ua: NEGATIVE
Nitrite: NEGATIVE
Protein, ur: NEGATIVE mg/dL
Specific Gravity, Urine: 1.021 (ref 1.005–1.030)
pH: 5 (ref 5.0–8.0)

## 2020-10-26 LAB — APTT: aPTT: 31 seconds (ref 24–36)

## 2020-10-26 LAB — PROTIME-INR
INR: 1 (ref 0.8–1.2)
Prothrombin Time: 13 seconds (ref 11.4–15.2)

## 2020-10-26 NOTE — Progress Notes (Signed)
Pre-CABG testing has been completed. Preliminary results can be found in CV Proc through chart review.   10/26/20 4:23 PM Miguel Fletcher RVT

## 2020-10-26 NOTE — Progress Notes (Signed)
PCP - Pt does not have a PCP but uses [facility] Cardiologist - Dr. Ida Rogue  Chest x-ray - 10/26/20 EKG - 10/22/20 Stress Test - denies ECHO - 10/02/20 Cardiac Cath - 10/06/20  Sleep Study - denies CPAP - denies  Blood Thinner Instructions: n/a Aspirin Instructions: Continue ASA, none on DOS.  COVID TEST- Pt tested positive for covid 10/05/20. Based on the guidelines the pt is in the 90 day window to not retest. Therefore, the pt can still have the scheduled procedure. These are the guidelines as follows:  Guidance: Patient previously tested + COVID; now past 90 day window seeking elective surgery (asymptomatic)  Retest patient If negative, proceed with surgery If positive, postpone surgery for 10 days from positive test Patient to quarantine for the (10 days) Do not retest again prior to surgery (even if scheduled a couple of weeks out) Use standard precautions for surgery   Anesthesia review: Yes, cardiac surgery for cardiac history.   Patient denies shortness of breath, fever, cough and chest pain at PAT appointment   All instructions explained to the patient, with a verbal understanding of the material. Patient agrees to go over the instructions while at home for a better understanding. Patient also instructed to self quarantine after being tested for COVID-19. The opportunity to ask questions was provided.

## 2020-10-27 MED ORDER — DEXMEDETOMIDINE HCL IN NACL 400 MCG/100ML IV SOLN
0.1000 ug/kg/h | INTRAVENOUS | Status: AC
Start: 2020-10-28 — End: 2020-10-28
  Administered 2020-10-28: .7 ug/kg/h via INTRAVENOUS
  Filled 2020-10-27: qty 100

## 2020-10-27 MED ORDER — NOREPINEPHRINE 4 MG/250ML-% IV SOLN
0.0000 ug/min | INTRAVENOUS | Status: DC
  Filled 2020-10-27: qty 250

## 2020-10-27 MED ORDER — TRANEXAMIC ACID 1000 MG/10ML IV SOLN
1.5000 mg/kg/h | INTRAVENOUS | Status: AC
  Administered 2020-10-28: 1.5 mg/kg/h via INTRAVENOUS
  Filled 2020-10-27: qty 25

## 2020-10-27 MED ORDER — TRANEXAMIC ACID (OHS) PUMP PRIME SOLUTION
2.0000 mg/kg | INTRAVENOUS | Status: DC
  Filled 2020-10-27: qty 1.95

## 2020-10-27 MED ORDER — POTASSIUM CHLORIDE 2 MEQ/ML IV SOLN
80.0000 meq | INTRAVENOUS | Status: DC
  Filled 2020-10-27: qty 40

## 2020-10-27 MED ORDER — CEFAZOLIN SODIUM-DEXTROSE 2-4 GM/100ML-% IV SOLN
2.0000 g | INTRAVENOUS | Status: AC
  Administered 2020-10-28: 2 g via INTRAVENOUS
  Filled 2020-10-27: qty 100

## 2020-10-27 MED ORDER — INSULIN REGULAR(HUMAN) IN NACL 100-0.9 UT/100ML-% IV SOLN
INTRAVENOUS | Status: AC
  Administered 2020-10-28: 1.9 [IU]/h via INTRAVENOUS
  Filled 2020-10-27: qty 100

## 2020-10-27 MED ORDER — VANCOMYCIN HCL 1500 MG/300ML IV SOLN
1500.0000 mg | INTRAVENOUS | Status: AC
  Administered 2020-10-28: 1500 mg via INTRAVENOUS
  Filled 2020-10-27: qty 300

## 2020-10-27 MED ORDER — PLASMA-LYTE A IV SOLN
INTRAVENOUS | Status: DC
  Filled 2020-10-27: qty 5

## 2020-10-27 MED ORDER — TRANEXAMIC ACID (OHS) BOLUS VIA INFUSION
15.0000 mg/kg | INTRAVENOUS | Status: AC
  Administered 2020-10-28: 1461 mg via INTRAVENOUS
  Filled 2020-10-27: qty 1461

## 2020-10-27 MED ORDER — NITROGLYCERIN IN D5W 200-5 MCG/ML-% IV SOLN
2.0000 ug/min | INTRAVENOUS | Status: AC
  Administered 2020-10-28: 10 ug/min via INTRAVENOUS
  Filled 2020-10-27: qty 250

## 2020-10-27 MED ORDER — MAGNESIUM SULFATE 50 % IJ SOLN
40.0000 meq | INTRAMUSCULAR | Status: DC
  Filled 2020-10-27: qty 9.85

## 2020-10-27 MED ORDER — PHENYLEPHRINE HCL-NACL 20-0.9 MG/250ML-% IV SOLN
30.0000 ug/min | INTRAVENOUS | Status: AC
Start: 2020-10-28 — End: 2020-10-28
  Administered 2020-10-28: 50 ug/min via INTRAVENOUS
  Filled 2020-10-27: qty 250

## 2020-10-27 MED ORDER — MILRINONE LACTATE IN DEXTROSE 20-5 MG/100ML-% IV SOLN
0.3000 ug/kg/min | INTRAVENOUS | Status: DC
  Filled 2020-10-27: qty 100

## 2020-10-27 MED ORDER — SODIUM CHLORIDE 0.9 % IV SOLN
INTRAVENOUS | Status: DC
  Filled 2020-10-27: qty 30

## 2020-10-27 MED ORDER — EPINEPHRINE HCL 5 MG/250ML IV SOLN IN NS
0.0000 ug/min | INTRAVENOUS | Status: DC
  Filled 2020-10-27: qty 250

## 2020-10-27 NOTE — Anesthesia Preprocedure Evaluation (Addendum)
Anesthesia Evaluation  Patient identified by MRN, date of birth, ID band Patient awake    Reviewed: Allergy & Precautions, NPO status , Patient's Chart, lab work & pertinent test results, reviewed documented beta blocker date and time   Airway Mallampati: III  TM Distance: >3 FB Neck ROM: Full    Dental  (+) Dental Advisory Given, Edentulous Lower, Edentulous Upper   Pulmonary Current Smoker and Patient abstained from smoking.,  Covid 7/22   Pulmonary exam normal breath sounds clear to auscultation       Cardiovascular hypertension, Pt. on medications and Pt. on home beta blockers + CAD and + Past MI  Normal cardiovascular exam Rhythm:Regular Rate:Normal  Echo 10/02/20: 1. Left ventricular ejection fraction, by estimation, is 60 to 65%. The  left ventricle has normal function. The left ventricle has no regional  wall motion abnormalities. There is severe left ventricular hypertrophy.  Left ventricular diastolic parameters  are consistent with Grade I diastolic dysfunction (impaired relaxation).  2. Right ventricular systolic function is normal. The right ventricular  size is normal.  3. Left atrial size was moderately dilated.    Neuro/Psych  Neuromuscular disease (Bell's Palsy Right) CVA, No Residual Symptoms    GI/Hepatic negative GI ROS, Neg liver ROS,   Endo/Other  Obesity   Renal/GU negative Renal ROS     Musculoskeletal negative musculoskeletal ROS (+)   Abdominal   Peds  Hematology  (+) Blood dyscrasia, anemia ,   Anesthesia Other Findings   Reproductive/Obstetrics                            Anesthesia Physical Anesthesia Plan  ASA: 4  Anesthesia Plan: General   Post-op Pain Management:    Induction: Intravenous  PONV Risk Score and Plan: 1 and Treatment may vary due to age or medical condition and Midazolam  Airway Management Planned: Oral ETT  Additional Equipment:  Arterial line, PA Cath, TEE, CVP and Ultrasound Guidance Line Placement  Intra-op Plan:   Post-operative Plan: Post-operative intubation/ventilation  Informed Consent: I have reviewed the patients History and Physical, chart, labs and discussed the procedure including the risks, benefits and alternatives for the proposed anesthesia with the patient or authorized representative who has indicated his/her understanding and acceptance.     Dental advisory given  Plan Discussed with: CRNA  Anesthesia Plan Comments:        Anesthesia Quick Evaluation

## 2020-10-28 ENCOUNTER — Inpatient Hospital Stay (HOSPITAL_COMMUNITY): Payer: Self-pay

## 2020-10-28 ENCOUNTER — Encounter (HOSPITAL_COMMUNITY): Payer: Self-pay | Admitting: Thoracic Surgery (Cardiothoracic Vascular Surgery)

## 2020-10-28 ENCOUNTER — Inpatient Hospital Stay (HOSPITAL_COMMUNITY): Payer: Self-pay | Admitting: Anesthesiology

## 2020-10-28 ENCOUNTER — Inpatient Hospital Stay (HOSPITAL_COMMUNITY)
Admission: RE | Disposition: A | Discharge status: Home / Self Care | Payer: Self-pay | Source: Home / Self Care | Attending: Thoracic Surgery (Cardiothoracic Vascular Surgery)

## 2020-10-28 ENCOUNTER — Inpatient Hospital Stay (HOSPITAL_COMMUNITY): Payer: Self-pay | Admitting: Physician Assistant

## 2020-10-28 ENCOUNTER — Inpatient Hospital Stay (HOSPITAL_COMMUNITY)
Admission: RE | Admit: 2020-10-28 | Discharge: 2020-11-02 | DRG: 236 | Disposition: A | Discharge status: Home / Self Care | Payer: Self-pay | Attending: Thoracic Surgery (Cardiothoracic Vascular Surgery) | Admitting: Thoracic Surgery (Cardiothoracic Vascular Surgery)

## 2020-10-28 ENCOUNTER — Other Ambulatory Visit: Payer: Self-pay

## 2020-10-28 DIAGNOSIS — I2510 Atherosclerotic heart disease of native coronary artery without angina pectoris: Principal | ICD-10-CM | POA: Diagnosis present

## 2020-10-28 DIAGNOSIS — I248 Other forms of acute ischemic heart disease: Secondary | ICD-10-CM | POA: Diagnosis present

## 2020-10-28 DIAGNOSIS — E669 Obesity, unspecified: Secondary | ICD-10-CM | POA: Diagnosis present

## 2020-10-28 DIAGNOSIS — D62 Acute posthemorrhagic anemia: Secondary | ICD-10-CM | POA: Diagnosis not present

## 2020-10-28 DIAGNOSIS — Z79899 Other long term (current) drug therapy: Secondary | ICD-10-CM

## 2020-10-28 DIAGNOSIS — R0600 Dyspnea, unspecified: Secondary | ICD-10-CM

## 2020-10-28 DIAGNOSIS — E8770 Fluid overload, unspecified: Secondary | ICD-10-CM | POA: Diagnosis present

## 2020-10-28 DIAGNOSIS — I25119 Atherosclerotic heart disease of native coronary artery with unspecified angina pectoris: Secondary | ICD-10-CM

## 2020-10-28 DIAGNOSIS — Z720 Tobacco use: Secondary | ICD-10-CM

## 2020-10-28 DIAGNOSIS — I454 Nonspecific intraventricular block: Secondary | ICD-10-CM | POA: Diagnosis present

## 2020-10-28 DIAGNOSIS — N179 Acute kidney failure, unspecified: Secondary | ICD-10-CM | POA: Diagnosis present

## 2020-10-28 DIAGNOSIS — E7800 Pure hypercholesterolemia, unspecified: Secondary | ICD-10-CM | POA: Diagnosis present

## 2020-10-28 DIAGNOSIS — Z8249 Family history of ischemic heart disease and other diseases of the circulatory system: Secondary | ICD-10-CM

## 2020-10-28 DIAGNOSIS — D696 Thrombocytopenia, unspecified: Secondary | ICD-10-CM | POA: Diagnosis present

## 2020-10-28 DIAGNOSIS — Z8616 Personal history of COVID-19: Secondary | ICD-10-CM

## 2020-10-28 DIAGNOSIS — E785 Hyperlipidemia, unspecified: Secondary | ICD-10-CM

## 2020-10-28 DIAGNOSIS — Z9114 Patient's other noncompliance with medication regimen: Secondary | ICD-10-CM

## 2020-10-28 DIAGNOSIS — I10 Essential (primary) hypertension: Secondary | ICD-10-CM

## 2020-10-28 DIAGNOSIS — Z951 Presence of aortocoronary bypass graft: Secondary | ICD-10-CM

## 2020-10-28 DIAGNOSIS — F17210 Nicotine dependence, cigarettes, uncomplicated: Secondary | ICD-10-CM | POA: Diagnosis present

## 2020-10-28 DIAGNOSIS — Z8673 Personal history of transient ischemic attack (TIA), and cerebral infarction without residual deficits: Secondary | ICD-10-CM

## 2020-10-28 DIAGNOSIS — Z6834 Body mass index (BMI) 34.0-34.9, adult: Secondary | ICD-10-CM

## 2020-10-28 DIAGNOSIS — Z7982 Long term (current) use of aspirin: Secondary | ICD-10-CM

## 2020-10-28 HISTORY — PX: CORONARY ARTERY BYPASS GRAFT: SHX141

## 2020-10-28 HISTORY — PX: TEE WITHOUT CARDIOVERSION: SHX5443

## 2020-10-28 HISTORY — PX: RADIAL ARTERY HARVEST: SHX5067

## 2020-10-28 HISTORY — PX: ENDOVEIN HARVEST OF GREATER SAPHENOUS VEIN: SHX5059

## 2020-10-28 LAB — POCT I-STAT, CHEM 8
BUN: 9 mg/dL (ref 8–23)
BUN: 10 mg/dL (ref 8–23)
BUN: 9 mg/dL (ref 8–23)
BUN: 10 mg/dL (ref 8–23)
BUN: 12 mg/dL (ref 8–23)
Calcium, Ion: 1.22 mmol/L (ref 1.15–1.40)
Calcium, Ion: 1.12 mmol/L — ABNORMAL LOW (ref 1.15–1.40)
Calcium, Ion: 1.19 mmol/L (ref 1.15–1.40)
Calcium, Ion: 1.16 mmol/L (ref 1.15–1.40)
Calcium, Ion: 1.12 mmol/L — ABNORMAL LOW (ref 1.15–1.40)
Chloride: 104 mmol/L (ref 98–111)
Chloride: 105 mmol/L (ref 98–111)
Chloride: 105 mmol/L (ref 98–111)
Chloride: 106 mmol/L (ref 98–111)
Chloride: 105 mmol/L (ref 98–111)
Creatinine, Ser: 0.7 mg/dL (ref 0.61–1.24)
Creatinine, Ser: 0.8 mg/dL (ref 0.61–1.24)
Creatinine, Ser: 0.8 mg/dL (ref 0.61–1.24)
Creatinine, Ser: 0.9 mg/dL (ref 0.61–1.24)
Creatinine, Ser: 0.9 mg/dL (ref 0.61–1.24)
Glucose, Bld: 98 mg/dL (ref 70–99)
Glucose, Bld: 117 mg/dL — ABNORMAL HIGH (ref 70–99)
Glucose, Bld: 95 mg/dL (ref 70–99)
Glucose, Bld: 143 mg/dL — ABNORMAL HIGH (ref 70–99)
Glucose, Bld: 130 mg/dL — ABNORMAL HIGH (ref 70–99)
HCT: 34 % — ABNORMAL LOW (ref 39.0–52.0)
HCT: 27 % — ABNORMAL LOW (ref 39.0–52.0)
HCT: 32 % — ABNORMAL LOW (ref 39.0–52.0)
HCT: 27 % — ABNORMAL LOW (ref 39.0–52.0)
HCT: 26 % — ABNORMAL LOW (ref 39.0–52.0)
Hemoglobin: 11.6 g/dL — ABNORMAL LOW (ref 13.0–17.0)
Hemoglobin: 10.9 g/dL — ABNORMAL LOW (ref 13.0–17.0)
Hemoglobin: 9.2 g/dL — ABNORMAL LOW (ref 13.0–17.0)
Hemoglobin: 9.2 g/dL — ABNORMAL LOW (ref 13.0–17.0)
Hemoglobin: 8.8 g/dL — ABNORMAL LOW (ref 13.0–17.0)
Potassium: 3.4 mmol/L — ABNORMAL LOW (ref 3.5–5.1)
Potassium: 3.5 mmol/L (ref 3.5–5.1)
Potassium: 5.1 mmol/L (ref 3.5–5.1)
Potassium: 4.7 mmol/L (ref 3.5–5.1)
Potassium: 4.2 mmol/L (ref 3.5–5.1)
Sodium: 143 mmol/L (ref 135–145)
Sodium: 140 mmol/L (ref 135–145)
Sodium: 142 mmol/L (ref 135–145)
Sodium: 140 mmol/L (ref 135–145)
Sodium: 141 mmol/L (ref 135–145)
TCO2: 28 mmol/L (ref 22–32)
TCO2: 26 mmol/L (ref 22–32)
TCO2: 31 mmol/L (ref 22–32)
TCO2: 26 mmol/L (ref 22–32)
TCO2: 24 mmol/L (ref 22–32)

## 2020-10-28 LAB — POCT I-STAT EG7
Acid-Base Excess: 3 mmol/L — ABNORMAL HIGH (ref 0.0–2.0)
Bicarbonate: 28 mmol/L (ref 20.0–28.0)
Calcium, Ion: 1.11 mmol/L — ABNORMAL LOW (ref 1.15–1.40)
HCT: 28 % — ABNORMAL LOW (ref 39.0–52.0)
Hemoglobin: 9.5 g/dL — ABNORMAL LOW (ref 13.0–17.0)
O2 Saturation: 79 %
Potassium: 3.9 mmol/L (ref 3.5–5.1)
Sodium: 143 mmol/L (ref 135–145)
TCO2: 29 mmol/L (ref 22–32)
pCO2, Ven: 46.1 mmHg (ref 44.0–60.0)
pH, Ven: 7.392 (ref 7.250–7.430)
pO2, Ven: 44 mmHg (ref 32.0–45.0)

## 2020-10-28 LAB — ABO/RH: ABO/RH(D): O POS

## 2020-10-28 LAB — POCT I-STAT 7, (LYTES, BLD GAS, ICA,H+H)
Acid-Base Excess: 4 mmol/L — ABNORMAL HIGH (ref 0.0–2.0)
Acid-Base Excess: 3 mmol/L — ABNORMAL HIGH (ref 0.0–2.0)
Acid-Base Excess: 1 mmol/L (ref 0.0–2.0)
Acid-Base Excess: 1 mmol/L (ref 0.0–2.0)
Acid-base deficit: 1 mmol/L (ref 0.0–2.0)
Acid-base deficit: 2 mmol/L (ref 0.0–2.0)
Acid-base deficit: 1 mmol/L (ref 0.0–2.0)
Acid-base deficit: 4 mmol/L — ABNORMAL HIGH (ref 0.0–2.0)
Bicarbonate: 30.5 mmol/L — ABNORMAL HIGH (ref 20.0–28.0)
Bicarbonate: 27.4 mmol/L (ref 20.0–28.0)
Bicarbonate: 25.9 mmol/L (ref 20.0–28.0)
Bicarbonate: 26.8 mmol/L (ref 20.0–28.0)
Bicarbonate: 24 mmol/L (ref 20.0–28.0)
Bicarbonate: 24.6 mmol/L (ref 20.0–28.0)
Bicarbonate: 23.7 mmol/L (ref 20.0–28.0)
Bicarbonate: 21.5 mmol/L (ref 20.0–28.0)
Calcium, Ion: 1.22 mmol/L (ref 1.15–1.40)
Calcium, Ion: 1.03 mmol/L — ABNORMAL LOW (ref 1.15–1.40)
Calcium, Ion: 1.13 mmol/L — ABNORMAL LOW (ref 1.15–1.40)
Calcium, Ion: 1.13 mmol/L — ABNORMAL LOW (ref 1.15–1.40)
Calcium, Ion: 1.11 mmol/L — ABNORMAL LOW (ref 1.15–1.40)
Calcium, Ion: 1.17 mmol/L (ref 1.15–1.40)
Calcium, Ion: 1.09 mmol/L — ABNORMAL LOW (ref 1.15–1.40)
Calcium, Ion: 1.1 mmol/L — ABNORMAL LOW (ref 1.15–1.40)
HCT: 35 % — ABNORMAL LOW (ref 39.0–52.0)
HCT: 27 % — ABNORMAL LOW (ref 39.0–52.0)
HCT: 29 % — ABNORMAL LOW (ref 39.0–52.0)
HCT: 28 % — ABNORMAL LOW (ref 39.0–52.0)
HCT: 28 % — ABNORMAL LOW (ref 39.0–52.0)
HCT: 29 % — ABNORMAL LOW (ref 39.0–52.0)
HCT: 30 % — ABNORMAL LOW (ref 39.0–52.0)
HCT: 31 % — ABNORMAL LOW (ref 39.0–52.0)
Hemoglobin: 11.9 g/dL — ABNORMAL LOW (ref 13.0–17.0)
Hemoglobin: 9.2 g/dL — ABNORMAL LOW (ref 13.0–17.0)
Hemoglobin: 9.9 g/dL — ABNORMAL LOW (ref 13.0–17.0)
Hemoglobin: 9.5 g/dL — ABNORMAL LOW (ref 13.0–17.0)
Hemoglobin: 9.5 g/dL — ABNORMAL LOW (ref 13.0–17.0)
Hemoglobin: 9.9 g/dL — ABNORMAL LOW (ref 13.0–17.0)
Hemoglobin: 10.2 g/dL — ABNORMAL LOW (ref 13.0–17.0)
Hemoglobin: 10.5 g/dL — ABNORMAL LOW (ref 13.0–17.0)
O2 Saturation: 100 %
O2 Saturation: 100 %
O2 Saturation: 100 %
O2 Saturation: 100 %
O2 Saturation: 96 %
O2 Saturation: 98 %
O2 Saturation: 97 %
O2 Saturation: 90 %
Patient temperature: 36.5
Patient temperature: 37
Patient temperature: 37.4
Potassium: 3.4 mmol/L — ABNORMAL LOW (ref 3.5–5.1)
Potassium: 3.9 mmol/L (ref 3.5–5.1)
Potassium: 4.6 mmol/L (ref 3.5–5.1)
Potassium: 5.1 mmol/L (ref 3.5–5.1)
Potassium: 4.2 mmol/L (ref 3.5–5.1)
Potassium: 4.1 mmol/L (ref 3.5–5.1)
Potassium: 4.2 mmol/L (ref 3.5–5.1)
Potassium: 3.8 mmol/L (ref 3.5–5.1)
Sodium: 144 mmol/L (ref 135–145)
Sodium: 142 mmol/L (ref 135–145)
Sodium: 142 mmol/L (ref 135–145)
Sodium: 141 mmol/L (ref 135–145)
Sodium: 142 mmol/L (ref 135–145)
Sodium: 143 mmol/L (ref 135–145)
Sodium: 143 mmol/L (ref 135–145)
Sodium: 143 mmol/L (ref 135–145)
TCO2: 32 mmol/L (ref 22–32)
TCO2: 29 mmol/L (ref 22–32)
TCO2: 27 mmol/L (ref 22–32)
TCO2: 28 mmol/L (ref 22–32)
TCO2: 25 mmol/L (ref 22–32)
TCO2: 26 mmol/L (ref 22–32)
TCO2: 25 mmol/L (ref 22–32)
TCO2: 23 mmol/L (ref 22–32)
pCO2 arterial: 53.7 mmHg — ABNORMAL HIGH (ref 32.0–48.0)
pCO2 arterial: 40.6 mmHg (ref 32.0–48.0)
pCO2 arterial: 42.7 mmHg (ref 32.0–48.0)
pCO2 arterial: 46.4 mmHg (ref 32.0–48.0)
pCO2 arterial: 42.6 mmHg (ref 32.0–48.0)
pCO2 arterial: 47.7 mmHg (ref 32.0–48.0)
pCO2 arterial: 40.1 mmHg (ref 32.0–48.0)
pCO2 arterial: 38.9 mmHg (ref 32.0–48.0)
pH, Arterial: 7.363 (ref 7.350–7.450)
pH, Arterial: 7.437 (ref 7.350–7.450)
pH, Arterial: 7.391 (ref 7.350–7.450)
pH, Arterial: 7.369 (ref 7.350–7.450)
pH, Arterial: 7.36 (ref 7.350–7.450)
pH, Arterial: 7.318 — ABNORMAL LOW (ref 7.350–7.450)
pH, Arterial: 7.38 (ref 7.350–7.450)
pH, Arterial: 7.352 (ref 7.350–7.450)
pO2, Arterial: 365 mmHg — ABNORMAL HIGH (ref 83.0–108.0)
pO2, Arterial: 399 mmHg — ABNORMAL HIGH (ref 83.0–108.0)
pO2, Arterial: 226 mmHg — ABNORMAL HIGH (ref 83.0–108.0)
pO2, Arterial: 241 mmHg — ABNORMAL HIGH (ref 83.0–108.0)
pO2, Arterial: 85 mmHg (ref 83.0–108.0)
pO2, Arterial: 106 mmHg (ref 83.0–108.0)
pO2, Arterial: 93 mmHg (ref 83.0–108.0)
pO2, Arterial: 63 mmHg — ABNORMAL LOW (ref 83.0–108.0)

## 2020-10-28 LAB — MAGNESIUM: Magnesium: 3.1 mg/dL — ABNORMAL HIGH (ref 1.7–2.4)

## 2020-10-28 LAB — BASIC METABOLIC PANEL
Anion gap: 5 (ref 5–15)
BUN: 11 mg/dL (ref 8–23)
CO2: 23 mmol/L (ref 22–32)
Calcium: 7.6 mg/dL — ABNORMAL LOW (ref 8.9–10.3)
Chloride: 111 mmol/L (ref 98–111)
Creatinine, Ser: 0.97 mg/dL (ref 0.61–1.24)
GFR, Estimated: >60 mL/min (ref >60)
Glucose, Bld: 126 mg/dL — ABNORMAL HIGH (ref 70–99)
Potassium: 3.9 mmol/L (ref 3.5–5.1)
Sodium: 139 mmol/L (ref 135–145)

## 2020-10-28 LAB — APTT: aPTT: 39 seconds — ABNORMAL HIGH (ref 24–36)

## 2020-10-28 LAB — CBC
HCT: 31.5 % — ABNORMAL LOW (ref 39.0–52.0)
HCT: 35.1 % — ABNORMAL LOW (ref 39.0–52.0)
Hemoglobin: 10.5 g/dL — ABNORMAL LOW (ref 13.0–17.0)
Hemoglobin: 11.3 g/dL — ABNORMAL LOW (ref 13.0–17.0)
MCH: 31.8 pg (ref 26.0–34.0)
MCH: 31 pg (ref 26.0–34.0)
MCHC: 33.3 g/dL (ref 30.0–36.0)
MCHC: 32.2 g/dL (ref 30.0–36.0)
MCV: 95.5 fL (ref 80.0–100.0)
MCV: 96.2 fL (ref 80.0–100.0)
Platelets: 113 10*3/uL — ABNORMAL LOW (ref 150–400)
Platelets: 147 10*3/uL — ABNORMAL LOW (ref 150–400)
RBC: 3.3 MIL/uL — ABNORMAL LOW (ref 4.22–5.81)
RBC: 3.65 MIL/uL — ABNORMAL LOW (ref 4.22–5.81)
RDW: 12.9 % (ref 11.5–15.5)
RDW: 13.2 % (ref 11.5–15.5)
WBC: 8.6 10*3/uL (ref 4.0–10.5)
WBC: 9.3 10*3/uL (ref 4.0–10.5)
nRBC: 0 % (ref 0.0–0.2)
nRBC: 0 % (ref 0.0–0.2)

## 2020-10-28 LAB — ECHO INTRAOPERATIVE TEE
Height: 68 in
Weight: 3438.4 oz

## 2020-10-28 LAB — PROTIME-INR
INR: 1.5 — ABNORMAL HIGH (ref 0.8–1.2)
Prothrombin Time: 18 seconds — ABNORMAL HIGH (ref 11.4–15.2)

## 2020-10-28 LAB — GLUCOSE, CAPILLARY
Glucose-Capillary: 118 mg/dL — ABNORMAL HIGH (ref 70–99)
Glucose-Capillary: 103 mg/dL — ABNORMAL HIGH (ref 70–99)
Glucose-Capillary: 118 mg/dL — ABNORMAL HIGH (ref 70–99)
Glucose-Capillary: 134 mg/dL — ABNORMAL HIGH (ref 70–99)
Glucose-Capillary: 121 mg/dL — ABNORMAL HIGH (ref 70–99)
Glucose-Capillary: 135 mg/dL — ABNORMAL HIGH (ref 70–99)
Glucose-Capillary: 139 mg/dL — ABNORMAL HIGH (ref 70–99)

## 2020-10-28 LAB — PLATELET COUNT: Platelets: 155 10*3/uL (ref 150–400)

## 2020-10-28 SURGERY — CORONARY ARTERY BYPASS GRAFTING (CABG)
Anesthesia: General | Site: Chest | Laterality: Right

## 2020-10-28 MED ORDER — LACTATED RINGERS IV SOLN: INTRAVENOUS | Status: DC

## 2020-10-28 MED ORDER — FENTANYL CITRATE (PF) 250 MCG/5ML IJ SOLN
INTRAMUSCULAR | Status: AC
  Filled 2020-10-28: qty 20

## 2020-10-28 MED ORDER — HEMOSTATIC AGENTS (NO CHARGE) OPTIME
TOPICAL | Status: DC | PRN
  Administered 2020-10-28: 1 via TOPICAL

## 2020-10-28 MED ORDER — PHENYLEPHRINE 40 MCG/ML (10ML) SYRINGE FOR IV PUSH (FOR BLOOD PRESSURE SUPPORT)
PREFILLED_SYRINGE | INTRAVENOUS | Status: AC
  Filled 2020-10-28: qty 10

## 2020-10-28 MED ORDER — SODIUM CHLORIDE 0.9% FLUSH: 3.0000 mL | INTRAVENOUS | Status: DC | PRN

## 2020-10-28 MED ORDER — SODIUM CHLORIDE 0.45 % IV SOLN: INTRAVENOUS | Status: DC | PRN

## 2020-10-28 MED ORDER — METOPROLOL TARTRATE 12.5 MG HALF TABLET: 12.5000 mg | ORAL_TABLET | Freq: Once | ORAL | Status: DC

## 2020-10-28 MED ORDER — POTASSIUM CHLORIDE 10 MEQ/50ML IV SOLN
10.0000 meq | INTRAVENOUS | Status: AC
Start: 2020-10-28 — End: 2020-10-28

## 2020-10-28 MED ORDER — CHLORHEXIDINE GLUCONATE CLOTH 2 % EX PADS
6.0000 | MEDICATED_PAD | Freq: Every day | CUTANEOUS | Status: DC
  Administered 2020-10-29 – 2020-10-31 (×3): 6 via TOPICAL

## 2020-10-28 MED ORDER — EPHEDRINE SULFATE-NACL 50-0.9 MG/10ML-% IV SOSY
PREFILLED_SYRINGE | INTRAVENOUS | Status: DC | PRN
  Administered 2020-10-28: 2.5 mg via INTRAVENOUS
  Administered 2020-10-28: 7.5 mg via INTRAVENOUS

## 2020-10-28 MED ORDER — HEPARIN SODIUM (PORCINE) 1000 UNIT/ML IJ SOLN
INTRAMUSCULAR | Status: DC | PRN
  Administered 2020-10-28: 2000 [IU] via INTRAVENOUS
  Administered 2020-10-28: 33000 [IU] via INTRAVENOUS

## 2020-10-28 MED ORDER — GLYCOPYRROLATE PF 0.2 MG/ML IJ SOSY
PREFILLED_SYRINGE | INTRAMUSCULAR | Status: AC
  Filled 2020-10-28: qty 1

## 2020-10-28 MED ORDER — NITROGLYCERIN IN D5W 200-5 MCG/ML-% IV SOLN: 7.0000 ug/min | INTRAVENOUS | Status: AC

## 2020-10-28 MED ORDER — TRAMADOL HCL 50 MG PO TABS
50.0000 mg | ORAL_TABLET | ORAL | Status: DC | PRN
  Administered 2020-10-28: 50 mg via ORAL
  Administered 2020-10-29 – 2020-10-31 (×4): 100 mg via ORAL
  Filled 2020-10-28 (×4): qty 2
  Filled 2020-10-28: qty 1

## 2020-10-28 MED ORDER — BISACODYL 10 MG RE SUPP: 10.0000 mg | Freq: Every day | RECTAL | Status: DC

## 2020-10-28 MED ORDER — MIDAZOLAM HCL 2 MG/2ML IJ SOLN: 2.0000 mg | INTRAMUSCULAR | Status: DC | PRN

## 2020-10-28 MED ORDER — ROCURONIUM BROMIDE 10 MG/ML (PF) SYRINGE
PREFILLED_SYRINGE | INTRAVENOUS | Status: DC | PRN
  Administered 2020-10-28: 100 mg via INTRAVENOUS
  Administered 2020-10-28 (×2): 50 mg via INTRAVENOUS

## 2020-10-28 MED ORDER — CHLORHEXIDINE GLUCONATE 0.12 % MT SOLN
15.0000 mL | Freq: Once | OROMUCOSAL | Status: AC
  Administered 2020-10-28: 15 mL via OROMUCOSAL
  Filled 2020-10-28: qty 15

## 2020-10-28 MED ORDER — DEXTROSE 50 % IV SOLN: 0.0000 mL | INTRAVENOUS | Status: DC | PRN

## 2020-10-28 MED ORDER — SODIUM CHLORIDE 0.9% FLUSH: 10.0000 mL | INTRAVENOUS | Status: DC | PRN

## 2020-10-28 MED ORDER — SODIUM CHLORIDE 0.9 % IV SOLN: INTRAVENOUS | Status: DC | PRN

## 2020-10-28 MED ORDER — SODIUM CHLORIDE 0.9% FLUSH
10.0000 mL | Freq: Two times a day (BID) | INTRAVENOUS | Status: DC
  Administered 2020-10-28 – 2020-10-30 (×4): 10 mL

## 2020-10-28 MED ORDER — PROTAMINE SULFATE 10 MG/ML IV SOLN
INTRAVENOUS | Status: DC | PRN
  Administered 2020-10-28 (×2): 50 mg via INTRAVENOUS
  Administered 2020-10-28: 40 mg via INTRAVENOUS
  Administered 2020-10-28: 50 mg via INTRAVENOUS
  Administered 2020-10-28: 10 mg via INTRAVENOUS
  Administered 2020-10-28 (×3): 50 mg via INTRAVENOUS

## 2020-10-28 MED ORDER — BISACODYL 5 MG PO TBEC
10.0000 mg | DELAYED_RELEASE_TABLET | Freq: Every day | ORAL | Status: DC
  Administered 2020-10-29 – 2020-11-01 (×4): 10 mg via ORAL
  Filled 2020-10-28 (×4): qty 2

## 2020-10-28 MED ORDER — VANCOMYCIN HCL IN DEXTROSE 1-5 GM/200ML-% IV SOLN
1000.0000 mg | Freq: Once | INTRAVENOUS | Status: AC
  Administered 2020-10-28: 1000 mg via INTRAVENOUS
  Filled 2020-10-28: qty 200

## 2020-10-28 MED ORDER — CEFAZOLIN SODIUM-DEXTROSE 2-4 GM/100ML-% IV SOLN
2.0000 g | Freq: Three times a day (TID) | INTRAVENOUS | Status: AC
Start: 2020-10-28 — End: 2020-10-30
  Administered 2020-10-28 – 2020-10-30 (×6): 2 g via INTRAVENOUS
  Filled 2020-10-28 (×7): qty 100

## 2020-10-28 MED ORDER — ACETAMINOPHEN 160 MG/5ML PO SOLN
1000.0000 mg | Freq: Four times a day (QID) | ORAL | Status: DC
Start: 2020-10-29 — End: 2020-11-02

## 2020-10-28 MED ORDER — ORAL CARE MOUTH RINSE
15.0000 mL | OROMUCOSAL | Status: DC
  Administered 2020-10-28 – 2020-10-29 (×5): 15 mL via OROMUCOSAL

## 2020-10-28 MED ORDER — DEXMEDETOMIDINE HCL IN NACL 400 MCG/100ML IV SOLN: 0.0000 ug/kg/h | INTRAVENOUS | Status: DC

## 2020-10-28 MED ORDER — PHENYLEPHRINE 40 MCG/ML (10ML) SYRINGE FOR IV PUSH (FOR BLOOD PRESSURE SUPPORT)
PREFILLED_SYRINGE | INTRAVENOUS | Status: DC | PRN
  Administered 2020-10-28: 120 ug via INTRAVENOUS
  Administered 2020-10-28 (×3): 80 ug via INTRAVENOUS
  Administered 2020-10-28: 120 ug via INTRAVENOUS

## 2020-10-28 MED ORDER — 0.9 % SODIUM CHLORIDE (POUR BTL) OPTIME
TOPICAL | Status: DC | PRN
  Administered 2020-10-28: 6000 mL

## 2020-10-28 MED ORDER — PROTAMINE SULFATE 10 MG/ML IV SOLN
INTRAVENOUS | Status: AC
  Filled 2020-10-28: qty 25

## 2020-10-28 MED ORDER — PANTOPRAZOLE SODIUM 40 MG PO TBEC
40.0000 mg | DELAYED_RELEASE_TABLET | Freq: Every day | ORAL | Status: DC
  Administered 2020-10-30 – 2020-11-02 (×4): 40 mg via ORAL
  Filled 2020-10-28 (×5): qty 1

## 2020-10-28 MED ORDER — PROTAMINE SULFATE 10 MG/ML IV SOLN
INTRAVENOUS | Status: AC
  Filled 2020-10-28: qty 5

## 2020-10-28 MED ORDER — PHENYLEPHRINE HCL-NACL 20-0.9 MG/250ML-% IV SOLN: 0.0000 ug/min | INTRAVENOUS | Status: DC

## 2020-10-28 MED ORDER — ALBUMIN HUMAN 5 % IV SOLN: INTRAVENOUS | Status: DC | PRN

## 2020-10-28 MED ORDER — CHLORHEXIDINE GLUCONATE 4 % EX LIQD: 30.0000 mL | CUTANEOUS | Status: DC

## 2020-10-28 MED ORDER — SODIUM CHLORIDE 0.9% FLUSH
3.0000 mL | Freq: Two times a day (BID) | INTRAVENOUS | Status: DC
  Administered 2020-10-29 – 2020-10-31 (×5): 3 mL via INTRAVENOUS

## 2020-10-28 MED ORDER — PLASMA-LYTE A IV SOLN
INTRAVENOUS | Status: DC | PRN
  Administered 2020-10-28: 1000 mL

## 2020-10-28 MED ORDER — LACTATED RINGERS IV SOLN: 500.0000 mL | Freq: Once | INTRAVENOUS | Status: DC | PRN

## 2020-10-28 MED ORDER — ALBUMIN HUMAN 5 % IV SOLN: 250.0000 mL | INTRAVENOUS | Status: AC | PRN

## 2020-10-28 MED ORDER — EPHEDRINE 5 MG/ML INJ
INTRAVENOUS | Status: AC
  Filled 2020-10-28: qty 5

## 2020-10-28 MED ORDER — PROPOFOL 10 MG/ML IV BOLUS
INTRAVENOUS | Status: DC | PRN
  Administered 2020-10-28: 70 mg via INTRAVENOUS

## 2020-10-28 MED ORDER — PROPOFOL 10 MG/ML IV BOLUS
INTRAVENOUS | Status: AC
  Filled 2020-10-28: qty 20

## 2020-10-28 MED ORDER — CHLORHEXIDINE GLUCONATE 0.12% ORAL RINSE (MEDLINE KIT)
15.0000 mL | Freq: Two times a day (BID) | OROMUCOSAL | Status: DC
  Administered 2020-10-28 – 2020-10-29 (×2): 15 mL via OROMUCOSAL

## 2020-10-28 MED ORDER — INSULIN REGULAR(HUMAN) IN NACL 100-0.9 UT/100ML-% IV SOLN: INTRAVENOUS | Status: DC

## 2020-10-28 MED ORDER — MAGNESIUM SULFATE 4 GM/100ML IV SOLN
4.0000 g | Freq: Once | INTRAVENOUS | Status: AC
  Administered 2020-10-28: 4 g via INTRAVENOUS
  Filled 2020-10-28: qty 100

## 2020-10-28 MED ORDER — SODIUM CHLORIDE 0.9 % IV SOLN: 250.0000 mL | INTRAVENOUS | Status: DC

## 2020-10-28 MED ORDER — ROCURONIUM BROMIDE 10 MG/ML (PF) SYRINGE
PREFILLED_SYRINGE | INTRAVENOUS | Status: AC
  Filled 2020-10-28: qty 20

## 2020-10-28 MED ORDER — CHLORHEXIDINE GLUCONATE 0.12 % MT SOLN
15.0000 mL | OROMUCOSAL | Status: AC
  Administered 2020-10-28: 15 mL via OROMUCOSAL

## 2020-10-28 MED ORDER — ACETAMINOPHEN 160 MG/5ML PO SOLN: 650.0000 mg | Freq: Once | ORAL | Status: AC

## 2020-10-28 MED ORDER — METOPROLOL TARTRATE 25 MG/10 ML ORAL SUSPENSION
12.5000 mg | Freq: Two times a day (BID) | ORAL | Status: DC
  Administered 2020-10-28: 12.5 mg
  Filled 2020-10-28: qty 5

## 2020-10-28 MED ORDER — GLYCOPYRROLATE PF 0.2 MG/ML IJ SOSY
PREFILLED_SYRINGE | INTRAMUSCULAR | Status: DC | PRN
  Administered 2020-10-28: .2 mg via INTRAVENOUS

## 2020-10-28 MED ORDER — MIDAZOLAM HCL (PF) 5 MG/ML IJ SOLN
INTRAMUSCULAR | Status: DC | PRN
  Administered 2020-10-28: 3 mg via INTRAVENOUS
  Administered 2020-10-28: 5 mg via INTRAVENOUS
  Administered 2020-10-28: 2 mg via INTRAVENOUS

## 2020-10-28 MED ORDER — METOPROLOL TARTRATE 12.5 MG HALF TABLET
12.5000 mg | ORAL_TABLET | Freq: Two times a day (BID) | ORAL | Status: DC
  Filled 2020-10-28: qty 1

## 2020-10-28 MED ORDER — OXYCODONE HCL 5 MG PO TABS
5.0000 mg | ORAL_TABLET | ORAL | Status: DC | PRN
  Administered 2020-10-28: 5 mg via ORAL
  Administered 2020-10-29 – 2020-11-02 (×10): 10 mg via ORAL
  Filled 2020-10-28 (×10): qty 2
  Filled 2020-10-28: qty 1
  Filled 2020-10-28: qty 2

## 2020-10-28 MED ORDER — ACETAMINOPHEN 650 MG RE SUPP
650.0000 mg | Freq: Once | RECTAL | Status: AC
  Administered 2020-10-28: 650 mg via RECTAL

## 2020-10-28 MED ORDER — LACTATED RINGERS IV SOLN: INTRAVENOUS | Status: DC | PRN

## 2020-10-28 MED ORDER — SODIUM CHLORIDE (PF) 0.9 % IJ SOLN
OROMUCOSAL | Status: DC | PRN
  Administered 2020-10-28: 12 mL via TOPICAL

## 2020-10-28 MED ORDER — METOPROLOL TARTRATE 5 MG/5ML IV SOLN
2.5000 mg | INTRAVENOUS | Status: DC | PRN
  Administered 2020-10-31: 2.5 mg via INTRAVENOUS
  Filled 2020-10-28: qty 5

## 2020-10-28 MED ORDER — ASPIRIN EC 325 MG PO TBEC
325.0000 mg | DELAYED_RELEASE_TABLET | Freq: Every day | ORAL | Status: DC
  Administered 2020-10-29 – 2020-11-02 (×5): 325 mg via ORAL
  Filled 2020-10-28 (×5): qty 1

## 2020-10-28 MED ORDER — MIDAZOLAM HCL (PF) 10 MG/2ML IJ SOLN
INTRAMUSCULAR | Status: AC
  Filled 2020-10-28: qty 2

## 2020-10-28 MED ORDER — ASPIRIN 81 MG PO CHEW
324.0000 mg | CHEWABLE_TABLET | Freq: Every day | ORAL | Status: DC
  Filled 2020-10-28: qty 4

## 2020-10-28 MED ORDER — ACETAMINOPHEN 500 MG PO TABS
1000.0000 mg | ORAL_TABLET | Freq: Four times a day (QID) | ORAL | Status: DC
Start: 2020-10-29 — End: 2020-11-02
  Administered 2020-10-28 – 2020-11-02 (×17): 1000 mg via ORAL
  Filled 2020-10-28 (×17): qty 2

## 2020-10-28 MED ORDER — SODIUM CHLORIDE (PF) 0.9 % IJ SOLN
INTRAMUSCULAR | Status: AC
  Filled 2020-10-28: qty 10

## 2020-10-28 MED ORDER — SODIUM CHLORIDE 0.9 % IV SOLN: INTRAVENOUS | Status: DC

## 2020-10-28 MED ORDER — DOCUSATE SODIUM 100 MG PO CAPS
200.0000 mg | ORAL_CAPSULE | Freq: Every day | ORAL | Status: DC
  Administered 2020-10-29 – 2020-11-01 (×4): 200 mg via ORAL
  Filled 2020-10-28 (×4): qty 2

## 2020-10-28 MED ORDER — FENTANYL CITRATE (PF) 250 MCG/5ML IJ SOLN
INTRAMUSCULAR | Status: DC | PRN
  Administered 2020-10-28: 50 ug via INTRAVENOUS
  Administered 2020-10-28 (×3): 250 ug via INTRAVENOUS
  Administered 2020-10-28: 200 ug via INTRAVENOUS

## 2020-10-28 MED ORDER — HEPARIN SODIUM (PORCINE) 1000 UNIT/ML IJ SOLN
INTRAMUSCULAR | Status: AC
  Filled 2020-10-28: qty 1

## 2020-10-28 MED ORDER — ~~LOC~~ CARDIAC SURGERY, PATIENT & FAMILY EDUCATION
Freq: Once | Status: DC
  Filled 2020-10-28: qty 1

## 2020-10-28 MED ORDER — MORPHINE SULFATE (PF) 2 MG/ML IV SOLN
1.0000 mg | INTRAVENOUS | Status: DC | PRN
  Administered 2020-10-28 (×2): 1 mg via INTRAVENOUS
  Administered 2020-10-28 – 2020-10-29 (×5): 2 mg via INTRAVENOUS
  Filled 2020-10-28 (×4): qty 1

## 2020-10-28 MED ORDER — FAMOTIDINE IN NACL 20-0.9 MG/50ML-% IV SOLN
20.0000 mg | Freq: Two times a day (BID) | INTRAVENOUS | Status: DC
  Administered 2020-10-28: 20 mg via INTRAVENOUS
  Filled 2020-10-28: qty 50

## 2020-10-28 MED ORDER — ONDANSETRON HCL 4 MG/2ML IJ SOLN
4.0000 mg | Freq: Four times a day (QID) | INTRAMUSCULAR | Status: DC | PRN
  Administered 2020-10-29: 4 mg via INTRAVENOUS
  Filled 2020-10-28: qty 2

## 2020-10-28 SURGICAL SUPPLY — 98 items
APPLIER CLIP 9.375 SM OPEN (CLIP)
BAG DECANTER FOR FLEXI CONT (MISCELLANEOUS) ×7 IMPLANT
BLADE CLIPPER SURG (BLADE) ×7 IMPLANT
BLADE STERNUM SYSTEM 6 (BLADE) ×7 IMPLANT
BLADE SURG 15 STRL LF DISP TIS (BLADE) IMPLANT
BLADE SURG 15 STRL SS (BLADE)
BNDG ELASTIC 4X5.8 VLCR STR LF (GAUZE/BANDAGES/DRESSINGS) ×14 IMPLANT
BNDG ELASTIC 6X5.8 VLCR STR LF (GAUZE/BANDAGES/DRESSINGS) ×7 IMPLANT
BNDG GAUZE ELAST 4 BULKY (GAUZE/BANDAGES/DRESSINGS) ×14 IMPLANT
CANISTER SUCT 3000ML PPV (MISCELLANEOUS) ×7 IMPLANT
CANNULA EZ GLIDE AORTIC 21FR (CANNULA) ×7 IMPLANT
CATH CPB KIT HENDRICKSON (MISCELLANEOUS) ×7 IMPLANT
CATH ROBINSON RED A/P 18FR (CATHETERS) ×7 IMPLANT
CATH THORACIC 36FR (CATHETERS) ×7 IMPLANT
CATH THORACIC 36FR RT ANG (CATHETERS) ×7 IMPLANT
CLIP APPLIE 9.375 SM OPEN (CLIP) IMPLANT
CLIP FOGARTY SPRING 6M (CLIP) ×7 IMPLANT
CLIP TI WIDE RED SMALL 24 (CLIP) ×14 IMPLANT
CLIP VESOCCLUDE MED 24/CT (CLIP) IMPLANT
CLIP VESOCCLUDE SM WIDE 24/CT (CLIP) IMPLANT
CONTAINER PROTECT SURGISLUSH (MISCELLANEOUS) ×14 IMPLANT
COVER MAYO STAND STRL (DRAPES) IMPLANT
CUFF TOURN SGL QUICK 18X4 (TOURNIQUET CUFF) IMPLANT
CUFF TOURN SGL QUICK 24 (TOURNIQUET CUFF)
CUFF TRNQT CYL 24X4X16.5-23 (TOURNIQUET CUFF) IMPLANT
DERMABOND ADVANCED (GAUZE/BANDAGES/DRESSINGS) ×2
DERMABOND ADVANCED .7 DNX12 (GAUZE/BANDAGES/DRESSINGS) ×5 IMPLANT
DRAPE CARDIOVASCULAR INCISE (DRAPES) ×2
DRAPE EXTREMITY T 121X128X90 (DISPOSABLE) ×7 IMPLANT
DRAPE HALF SHEET 40X57 (DRAPES) IMPLANT
DRAPE SRG 135X102X78XABS (DRAPES) ×5 IMPLANT
DRAPE WARM FLUID 44X44 (DRAPES) ×7 IMPLANT
DRSG COVADERM 4X14 (GAUZE/BANDAGES/DRESSINGS) ×7 IMPLANT
ELECT REM PT RETURN 9FT ADLT (ELECTROSURGICAL) ×14
ELECTRODE REM PT RTRN 9FT ADLT (ELECTROSURGICAL) ×10 IMPLANT
FELT TEFLON 1X6 (MISCELLANEOUS) ×7 IMPLANT
GAUZE SPONGE 4X4 12PLY STRL (GAUZE/BANDAGES/DRESSINGS) ×21 IMPLANT
GLOVE SURG SIGNA 7.5 PF LTX (GLOVE) ×21 IMPLANT
GOWN STRL REUS W/ TWL LRG LVL3 (GOWN DISPOSABLE) ×20 IMPLANT
GOWN STRL REUS W/ TWL XL LVL3 (GOWN DISPOSABLE) ×10 IMPLANT
GOWN STRL REUS W/TWL LRG LVL3 (GOWN DISPOSABLE) ×8
GOWN STRL REUS W/TWL XL LVL3 (GOWN DISPOSABLE) ×4
HEMOSTAT POWDER SURGIFOAM 1G (HEMOSTASIS) ×21 IMPLANT
HEMOSTAT SURGICEL 2X14 (HEMOSTASIS) ×7 IMPLANT
KIT BASIN OR (CUSTOM PROCEDURE TRAY) ×7 IMPLANT
KIT SUCTION CATH 14FR (SUCTIONS) ×14 IMPLANT
KIT TURNOVER KIT B (KITS) ×7 IMPLANT
KIT VASOVIEW HEMOPRO 2 VH 4000 (KITS) ×7 IMPLANT
MARKER GRAFT CORONARY BYPASS (MISCELLANEOUS) ×21 IMPLANT
NS IRRIG 1000ML POUR BTL (IV SOLUTION) ×42 IMPLANT
PACK E OPEN HEART (SUTURE) ×7 IMPLANT
PACK OPEN HEART (CUSTOM PROCEDURE TRAY) ×7 IMPLANT
PAD ARMBOARD 7.5X6 YLW CONV (MISCELLANEOUS) ×14 IMPLANT
PAD ELECT DEFIB RADIOL ZOLL (MISCELLANEOUS) ×7 IMPLANT
PENCIL BUTTON HOLSTER BLD 10FT (ELECTRODE) ×7 IMPLANT
POSITIONER HEAD DONUT 9IN (MISCELLANEOUS) ×7 IMPLANT
PUNCH AORTIC ROTATE 4.5MM 8IN (MISCELLANEOUS) ×7 IMPLANT
SET MPS 3-ND DEL (MISCELLANEOUS) ×7 IMPLANT
SHEARS HARMONIC 9CM CVD (BLADE) ×7 IMPLANT
SOL ANTI FOG 6CC (MISCELLANEOUS) ×5 IMPLANT
SOLUTION ANTI FOG 6CC (MISCELLANEOUS) ×2
SPONGE T-LAP 18X36 ~~LOC~~+RFID STR (SPONGE) ×7 IMPLANT
SUPPORT HEART JANKE-BARRON (MISCELLANEOUS) ×7 IMPLANT
SUT BONE WAX W31G (SUTURE) ×7 IMPLANT
SUT MNCRL AB 4-0 PS2 18 (SUTURE) ×7 IMPLANT
SUT PROLENE 3 0 SH DA (SUTURE) ×14 IMPLANT
SUT PROLENE 4 0 RB 1 (SUTURE) ×2
SUT PROLENE 4 0 SH DA (SUTURE) IMPLANT
SUT PROLENE 4-0 RB1 .5 CRCL 36 (SUTURE) ×5 IMPLANT
SUT PROLENE 5 0 C 1 36 (SUTURE) ×7 IMPLANT
SUT PROLENE 6 0 C 1 30 (SUTURE) ×35 IMPLANT
SUT PROLENE 7 0 BV 1 (SUTURE) ×77 IMPLANT
SUT PROLENE 7 0 BV1 MDA (SUTURE) ×7 IMPLANT
SUT PROLENE 8 0 BV175 6 (SUTURE) ×21 IMPLANT
SUT SILK  1 MH (SUTURE) ×2
SUT SILK 1 MH (SUTURE) ×5 IMPLANT
SUT STEEL 6MS V (SUTURE) ×7 IMPLANT
SUT STEEL STERNAL CCS#1 18IN (SUTURE) IMPLANT
SUT STEEL SZ 6 DBL 3X14 BALL (SUTURE) ×7 IMPLANT
SUT VIC AB 1 CTX 36 (SUTURE) ×4
SUT VIC AB 1 CTX36XBRD ANBCTR (SUTURE) ×10 IMPLANT
SUT VIC AB 2-0 CT1 27 (SUTURE) ×4
SUT VIC AB 2-0 CT1 TAPERPNT 27 (SUTURE) ×10 IMPLANT
SUT VIC AB 2-0 CTX 27 (SUTURE) IMPLANT
SUT VIC AB 3-0 SH 27 (SUTURE)
SUT VIC AB 3-0 SH 27X BRD (SUTURE) IMPLANT
SUT VIC AB 3-0 X1 27 (SUTURE) ×7 IMPLANT
SUT VICRYL 4-0 PS2 18IN ABS (SUTURE) IMPLANT
SYR 50ML SLIP (SYRINGE) IMPLANT
SYSTEM SAHARA CHEST DRAIN ATS (WOUND CARE) ×7 IMPLANT
TAPE CLOTH SURG 4X10 WHT LF (GAUZE/BANDAGES/DRESSINGS) ×7 IMPLANT
TAPE PAPER 2X10 WHT MICROPORE (GAUZE/BANDAGES/DRESSINGS) ×7 IMPLANT
TOWEL GREEN STERILE (TOWEL DISPOSABLE) ×7 IMPLANT
TOWEL GREEN STERILE FF (TOWEL DISPOSABLE) ×7 IMPLANT
TRAY FOLEY SLVR 16FR TEMP STAT (SET/KITS/TRAYS/PACK) ×7 IMPLANT
TUBING LAP HI FLOW INSUFFLATIO (TUBING) ×7 IMPLANT
UNDERPAD 30X36 HEAVY ABSORB (UNDERPADS AND DIAPERS) ×7 IMPLANT
WATER STERILE IRR 1000ML POUR (IV SOLUTION) ×14 IMPLANT

## 2020-10-28 NOTE — Op Note (Signed)
NAME: AUGUSTA, PROPSON MEDICAL RECORD NO: [id number] ACCOUNT NO: [id number] DATE OF BIRTH: 1956-01-21 FACILITY: [facility] LOCATION: [facility] PHYSICIAN: Revonda Standard. Roxan Hockey, MD  Operative Report   DATE OF PROCEDURE: 10/28/2020  PREOPERATIVE DIAGNOSIS:  Left main and 3-vessel coronary artery disease.  POSTOPERATIVE DIAGNOSIS:  Left main and 3-vessel coronary artery disease.  PROCEDURES PERFORMED:   Median sternotomy, extracorporeal circulation, Coronary artery bypass grafting x5 Left internal mammary artery to LAD, Left radial artery to ramus intermedius, Saphenous vein graft to first diagonal, Sequential saphenous vein  graft to posterior descending and posterolateral Endoscopic vein harvest right thigh, Open left radial harvest.  SURGEON: Modesto Charon, MD  ASSISTANTS:  Enid Cutter, PA and Jadene Pierini, [place]  ANESTHESIA:  General.  FINDINGS:  Transesophageal echocardiography revealed massive left ventricular hypertrophy with no significant valvular pathology.  INTRAOPERATIVE FINDINGS:  Small pericardial effusion.  Massive left ventricular hypertrophy.  Good quality conduits.  Coronaries diffusely diseased, fair quality targets.  CLINICAL NOTE: Mr. Mennella is a 65 year old man with poorly controlled hypertension, hyperlipidemia, tobacco abuse and stroke.  He presented with axillary pain and cardiac workup was initiated.  CT angiogram showed no evidence of aortic dissection.  His  high sensitivity troponin was elevated at 75 but at that time he refused to stay for further testing.  He then presented back with an indigestion like pain and had an elevated troponin.  On cardiac catheterization, he had a 70% left main disease and 3-vessel  coronary artery disease.  He was referred for coronary artery bypass grafting.  The indications, risks, benefits, and alternatives were discussed in detail with the patient.  He understood and accepted the risks and agreed to  proceed.  DESCRIPTION OF PROCEDURE: Mr. Coultas was brought to the preoperative holding area on 10/28/2020.  Anesthesia placed a central venous catheter and an arterial blood pressure monitoring line. Cardiac output was monitored with FloTrac device.  He was  taken to the operating room and anesthetized, intubated.  A Foley catheter was placed.  Intravenous antibiotics were administered.  Transesophageal echocardiography was performed.  Findings as noted above.  The chest, abdomen, legs and left arm were  prepped and draped in the usual sterile fashion.  The Allen's test had been confirmed with pulse oximetry both in the preoperative holding area and in the OR prior to induction.  A timeout was performed.  Conduits were harvested simultaneously.  An incision was made on the volar aspect of the left wrist.  A short segment of the radial artery was dissected out. With proximal occlusion, there was a good Doppler signal in the palmar  arch and there was a good pulse in the radial distal to the clamp.  The incision was extended to just below the antecubital fossa.  The radial artery was harvested in standard fashion.  Simultaneously, an incision was made in the medial aspect of the  left leg at the level of the knee.  The greater saphenous vein was identified and was harvested endoscopically from the right thigh.  It was slightly enlarged, but overall good quality vessel.  A median sternotomy was performed and the left internal  mammary artery was harvested using standard technique.  This was difficult due to the patient's body habitus and also the massive left ventricular hypertrophy as well as overall chest wall stiffness.  However, the mammary artery was a good quality  vessel with excellent flow when divided distally.  2000 units of heparin was administered during the vessel harvest. The remainder of full  heparin dose was given after the conduits had been harvested. The arm and leg incisions were closed in  standard  fashion.  The left arm was wrapped and tucked to the patient's side.  A sternal retractor was placed and opened over time.  Again, the chest wall was stiff.  The pericardium was opened.  There was a moderate pericardial effusion.  There was massive cardiomegaly.  The aorta was of normal caliber with no evidence of  atherosclerotic disease.  The remainder of the full heparin dose was administered.  After confirming adequate anticoagulation with ACT measurement. The aorta was cannulated via concentric 2-0 Ethibond pledgeted pursestring sutures.  A dual stage venous cannula was placed via a pursestring suture in the right atrial appendage.  Cardiopulmonary bypass was initiated.  Flows were maintained per protocol.  The patient was cooled to 32 degrees Celsius.  The coronary arteries were inspected and anastomotic sites were chosen.  Even  this was difficult due to the massive hypertrophy.  The ramus intermedius was intramyocardial.  All the coronaries were diffusely diseased but had good size. The conduits were inspected and cut to length.  A foam pad was placed in the pericardium to  insulate the heart.  A temperature probe was placed in the myocardial septum and a cardioplegia cannula was placed in the ascending aorta.  The aorta was cross clamped.  The left ventricle was emptied via the aortic root vent. Cardiac arrest then was achieved with a combination of cold antegrade blood cardioplegia and topical iced saline.  There was diastolic arrest after approximately 500  mL of cardioplegia, a total of 2 liters was administered and there was myocardial septal cooling to 10 degrees Celsius.  A reversed saphenous vein graft was placed end-to-side to the posterior descending and posterolateral branches of the right coronary.  These vessels both accepted a 1.5 mm probe, both were fair quality due to diffuse disease.  A side-to-side anastomosis was  performed to the posterior descending and end-to-side  to the posterolateral.  All anastomoses were probed proximally and distally at their completion to ensure patency.  Cardioplegia was administered down the graft.  There was good flow and good  hemostasis.  The heart was elevated exposing the anterior wall.  A reversed saphenous vein graft then was placed end-to-side to the diagonal branch of the LAD.  This vessel was diffusely diseased, but did accept a 1.5 mm probe at the site of the anastomosis.  The vein  graft was anastomosed end-to-side with a running 7-0 Prolene suture.  Again, a probe passed proximally and distally.  Cardioplegia was administered down the graft and there was good flow and good hemostasis.  The heart then was retracted exposing the anterolateral wall.  The ramus intermedius branch was dissected out.  This was a 2 mm vessel, but was diffusely diseased.  The radial artery was large caliber vessel.  The distal end was bevelled.  It was  anastomosed end-to-side to the ramus intermedius with a running 7-0 Prolene suture.  The probe did pass easily proximally and distally at the completion of the anastomosis.  Cardioplegia was administered down the graft and there was good flow and good  hemostasis.  Additional cardioplegia was administered down the aortic root.  The left internal mammary artery was brought through a window in the pericardium.  The distal end was bevelled. It was actually difficult for the mammary to reach to the LAD because of the  patient's body habitus and the massive cardiomegaly,  but ultimately we were able to position the heart in a way that we could do the anastomosis.  There was plenty of length on the mammary once the heart was back in its normal location.  The left mammary  was a 1.5 mm good quality conduit.  It was anastomosed end-to-side to the LAD with a running 8-0 Prolene suture.  At the completion of the mammary to LAD anastomosis, the bulldog clamp was removed.  Rapid septal rewarming was noted.  The  bulldog clamp was replaced.  The mammary pedicle was tacked to the epicardial surface of the heart with 6-0 Prolene sutures.  Additional cardioplegia was administered.  The radial and vein grafts were cut to length.  The proximal anastomoses were performed to 4.5 mm punch aortotomies.  6-0 Prolenes were used for the vein grafts and a 7-0 was used for the radial.  At the  completion of the final proximal anastomosis, the patient was placed in Trendelenburg position.  Lidocaine was administered.  The aortic root was de-aired and the aortic crossclamp was removed.  The total crossclamp time was 99 minutes.  The patient  required a single defibrillation with 10 joules and then was bradycardic thereafter.  While rewarming was completed, all proximal and distal anastomoses were inspected for hemostasis and repair sutures were placed as needed.  Epicardial pacing wires were placed on the right ventricle and right atrium. When the patient had rewarmed to a  core temperature of 37 degrees Celsius, he was weaned from cardiopulmonary bypass on the first attempt. He was DDD paced and on no inotropic support at the time of separation from bypass. The total bypass time was 158 minutes.  Post-bypass  transesophageal echocardiography revealed severe left ventricular hypertrophy, but preserved systolic wall motion.  The cardiac index was greater than 2 liters per minute per meter squared by FloTrac. Test dose of protamine was administered and was well  tolerated.  The atrial and aortic cannula were removed.  The remainder of the protamine was administered without incident.  The chest was irrigated with warm saline.  Hemostasis was achieved.  The pericardium was not closed.  Left pleural and mediastinal  chest tubes were placed through separate subcostal incisions.  The sternum was closed with a combination of single and double heavy gauge stainless steel wires.  The pectoralis fascia, subcutaneous tissue and skin were  closed in standard fashion.  All  sponge, needle and instrument counts were correct at the end of the procedure. The patient was taken from the operating room to the surgical intensive care unit, intubated and in good condition.   SHW D: 10/28/2020 5:56:42 pm T: 10/28/2020 9:41:00 pm  JOB: [id number]

## 2020-10-28 NOTE — Progress Notes (Signed)
      [address]       ,[place] [id number]             [phone]      S/p CABG  Just extubated  BP 112/66   Pulse 70   Temp 98.42 F (36.9 C)   Resp (!) 23   Ht '5\' 8"'$  (1.727 m)   Wt 97.5 kg   SpO2 100%   BMI 32.68 kg/m  NTG @ 7 , neo @ 15 CVP= 16 CI 2.2  Intake/Output Summary (Last 24 hours) at 10/28/2020 1823 Last data filed at 10/28/2020 1500 Gross per 24 hour  Intake 3889.56 ml  Output 1312 ml  Net 2577.56 ml   Doing well s/p CABG ECG and CXR oK CBG well controlled  Remo Lipps C. Roxan Hockey, MD [facility] [phone]

## 2020-10-28 NOTE — Procedures (Signed)
Extubation Procedure Note  Patient Details:   Name: ISON GAIDA DOB: May 19, 1955 MRN: [id number]   Airway Documentation:    Vent end date: 10/28/20 Vent end time: 1815   Evaluation  O2 sats: stable throughout Complications: No apparent complications Patient did tolerate procedure well. Bilateral Breath Sounds: Clear, Diminished   Yes  Patient was extubated to a 4L [place] without any complications, dyspnea or stridor noted. Positive cuff leak prior to extubation, NIF: -26, VC: 1L.   Claretta Fraise 10/28/2020, 6:15 PM

## 2020-10-28 NOTE — Progress Notes (Signed)
  Echocardiogram Echocardiogram Transesophageal has been performed.  Fidel Levy 10/28/2020, 8:35 AM

## 2020-10-28 NOTE — Anesthesia Procedure Notes (Signed)
Central Venous Catheter Insertion Performed by: [name], MD, anesthesiologist Start/End8/12/2020 7:50 AM, 10/28/2020 8:00 AM Patient location: Pre-op. Preanesthetic checklist: patient identified, IV checked, site marked, risks and benefits discussed, surgical consent, monitors and equipment checked, pre-op evaluation, timeout performed and anesthesia consent Position: Trendelenburg Lidocaine 1% used for infiltration and patient sedated Hand hygiene performed , maximum sterile barriers used  and Seldinger technique used Catheter size: 8.5 Fr Central line was placed.Sheath introducer Procedure performed using ultrasound guided technique. Ultrasound Notes:anatomy identified, needle tip was noted to be adjacent to the nerve/plexus identified, no ultrasound evidence of intravascular and/or intraneural injection and image(s) printed for medical record Attempts: 1 Following insertion, line sutured, dressing applied and Biopatch. Post procedure assessment: free fluid flow, blood return through all ports and no air  Patient tolerated the procedure well with no immediate complications.

## 2020-10-28 NOTE — Interval H&P Note (Signed)
History and Physical Interval Note:  Vascular results noted. Performed Allens test on left radial with pulse ox and pulsatility minimally reduced with radial compression. Will assess in OR to make final decision  10/28/2020 8:25 AM  [name]  has presented today for surgery, with the diagnosis of CAD LEFT MAIN DISEASE.  The various methods of treatment have been discussed with the patient and family. After consideration of risks, benefits and other options for treatment, the patient has consented to  Procedure(s) with comments: CORONARY ARTERY BYPASS GRAFTING (CABG) (N/A) - POSSIBLE BIMA RADIAL ARTERY HARVEST (Left) TRANSESOPHAGEAL ECHOCARDIOGRAM (TEE) (N/A) as a surgical intervention.  The patient's history has been reviewed, patient examined, no change in status, stable for surgery.  I have reviewed the patient's chart and labs.  Questions were answered to the patient's satisfaction.     Miguel Fletcher

## 2020-10-28 NOTE — Transfer of Care (Signed)
Immediate Anesthesia Transfer of Care Note  Patient: Miguel Fletcher  Procedure(s) Performed: CORONARY ARTERY BYPASS GRAFTING (CABG), ON PUMP, TIMES FIVE, USING LEFT INTERNAL MAMMARY ARTERY, LEFT RADIAL ARTERY AND ENDOSCOPICALLY HARVESTED RIGHT GREATER SAPHENOUS VEIN (Chest) LEFT RADIAL ARTERY HARVEST (Left) TRANSESOPHAGEAL ECHOCARDIOGRAM (TEE) ENDOVEIN HARVEST OF RIGHT GREATER SAPHENOUS VEIN (Right) APPLICATION OF CELL SAVER  Patient Location: ICU  Anesthesia Type:General  Level of Consciousness: sedated and unresponsive  Airway & Oxygen Therapy: Patient remains intubated per anesthesia plan and Patient placed on Ventilator (see vital sign flow sheet for setting)  Post-op Assessment: Report given to RN and Post -op Vital signs reviewed and stable  Post vital signs: Reviewed and stable  Last Vitals:  Vitals Value Taken Time  BP    Temp    Pulse 80 10/28/20 1447  Resp 6 10/28/20 1447  SpO2 100 % 10/28/20 1447  Vitals shown include unvalidated device data.  Last Pain:  Vitals:   10/28/20 0718  TempSrc:   PainSc: 0-No pain         Complications: No notable events documented.

## 2020-10-28 NOTE — Anesthesia Procedure Notes (Signed)
Procedure Name: Intubation Date/Time: 10/28/2020 8:45 AM Performed by: Mariea Clonts, CRNA Pre-anesthesia Checklist: Patient identified, Emergency Drugs available, Suction available and Patient being monitored Patient Re-evaluated:Patient Re-evaluated prior to induction Oxygen Delivery Method: Circle System Utilized Preoxygenation: Pre-oxygenation with 100% oxygen Induction Type: IV induction Ventilation: Oral airway inserted - appropriate to patient size, Two handed mask ventilation required and Mask ventilation with difficulty Laryngoscope Size: Miller and 2 Grade View: Grade I Tube type: Oral Tube size: 8.0 mm Number of attempts: 1 Airway Equipment and Method: Stylet and Oral airway Placement Confirmation: ETT inserted through vocal cords under direct vision, positive ETCO2 and breath sounds checked- equal and bilateral Tube secured with: Tape Dental Injury: Teeth and Oropharynx as per pre-operative assessment

## 2020-10-28 NOTE — Progress Notes (Signed)
RT NOTE: rapid wean initiated at 1720.

## 2020-10-28 NOTE — Brief Op Note (Addendum)
10/28/2020  12:42 PM  PATIENT:  Miguel Fletcher  65 y.o. male  PRE-OPERATIVE DIAGNOSIS:  Coronary Artery Disease LEFT MAIN DISEASE  POST-OPERATIVE DIAGNOSIS:  Coronary Artery Disease LEFT MAIN DISEASE  PROCEDURES:  --CORONARY ARTERY BYPASS GRAFTING X 5, ON PUMP, USING LEFT INTERNAL MAMMARY ARTERY, LEFT RADIAL ARTERY, AND ENDOSCOPICALLY HARVESTED RIGHT GREATER SAPHENOUS VEIN   LIMA->LAD  LRA->Ramus  SVG->D1  SVG;>PDA->PLB (sequential graft)   --LEFT RADIAL ARTERY HARVEST   --TRANSESOPHAGEAL ECHOCARDIOGRAM   --ENDOVEIN HARVEST OF RIGHT GREATER SAPHENOUS VEIN (Right)  --APPLICATION OF CELL SAVER  SURGEON: Melrose Nakayama, MD - Primary  PHYSICIAN ASSISTANT:  [place]  ASSISTANTS: Gaylyn Cheers, RN, RN First Assistant   ANESTHESIA:   general  EBL:  Per anesthesia and perfusion records   BLOOD ADMINISTERED:none  DRAINS:  Left pleural and mediastinal drains    LOCAL MEDICATIONS USED:  NONE  SPECIMEN:  No Specimen  DISPOSITION OF SPECIMEN:  N/A  COUNTS:  YES  DICTATION: .Dragon Dictation  PLAN OF CARE: Admit to inpatient   PATIENT DISPOSITION:  ICU - extubated and stable.   Delay start of Pharmacological VTE agent (>24hrs) due to surgical blood loss or risk of bleeding: yes  FINDINGS Massive LVH, no valvular pathology Diffusely diseased, fair quality coronaries Good conduits

## 2020-10-28 NOTE — Anesthesia Procedure Notes (Signed)
Arterial Line Insertion Start/End8/12/2020 8:09 AM, 10/28/2020 8:09 AM Performed by: CRNA  Patient location: Pre-op. Preanesthetic checklist: patient identified, IV checked, site marked, risks and benefits discussed, surgical consent, monitors and equipment checked, pre-op evaluation, timeout performed and anesthesia consent Lidocaine 1% used for infiltration Right, radial was placed Catheter size: 20 G Hand hygiene performed  and maximum sterile barriers used   Attempts: 1 Procedure performed without using ultrasound guided technique. Following insertion, Biopatch and dressing applied. Post procedure assessment: normal  Patient tolerated the procedure well with no immediate complications.

## 2020-10-28 NOTE — Anesthesia Postprocedure Evaluation (Signed)
Anesthesia Post Note  Patient: Miguel Fletcher  Procedure(s) Performed: CORONARY ARTERY BYPASS GRAFTING (CABG), ON PUMP, TIMES FIVE, USING LEFT INTERNAL MAMMARY ARTERY, LEFT RADIAL ARTERY AND ENDOSCOPICALLY HARVESTED RIGHT GREATER SAPHENOUS VEIN (Chest) LEFT RADIAL ARTERY HARVEST (Left) TRANSESOPHAGEAL ECHOCARDIOGRAM (TEE) ENDOVEIN HARVEST OF RIGHT GREATER SAPHENOUS VEIN (Right) APPLICATION OF CELL SAVER     Patient location during evaluation: SICU Anesthesia Type: General Level of consciousness: sedated Pain management: pain level controlled Vital Signs Assessment: post-procedure vital signs reviewed and stable Respiratory status: patient remains intubated per anesthesia plan Cardiovascular status: stable Postop Assessment: no apparent nausea or vomiting Anesthetic complications: no   No notable events documented.  Last Vitals:  Vitals:   10/28/20 1600 10/28/20 1722  BP: 112/73 (!) 125/52  Pulse: 70 70  Resp: 18 (!) 31  Temp: (!) 36.4 C   SpO2: 100% 100%    Last Pain:  Vitals:   10/28/20 1500  TempSrc: Bladder  PainSc:                  Miguel Fletcher

## 2020-10-29 ENCOUNTER — Inpatient Hospital Stay (HOSPITAL_COMMUNITY): Payer: Self-pay

## 2020-10-29 ENCOUNTER — Encounter (HOSPITAL_COMMUNITY): Payer: Self-pay | Admitting: Thoracic Surgery (Cardiothoracic Vascular Surgery)

## 2020-10-29 LAB — CBC
HCT: 31.7 % — ABNORMAL LOW (ref 39.0–52.0)
HCT: 31.6 % — ABNORMAL LOW (ref 39.0–52.0)
Hemoglobin: 10.2 g/dL — ABNORMAL LOW (ref 13.0–17.0)
Hemoglobin: 10.2 g/dL — ABNORMAL LOW (ref 13.0–17.0)
MCH: 31.1 pg (ref 26.0–34.0)
MCH: 31.6 pg (ref 26.0–34.0)
MCHC: 32.2 g/dL (ref 30.0–36.0)
MCHC: 32.3 g/dL (ref 30.0–36.0)
MCV: 96.6 fL (ref 80.0–100.0)
MCV: 97.8 fL (ref 80.0–100.0)
Platelets: 128 10*3/uL — ABNORMAL LOW (ref 150–400)
Platelets: 146 10*3/uL — ABNORMAL LOW (ref 150–400)
RBC: 3.28 MIL/uL — ABNORMAL LOW (ref 4.22–5.81)
RBC: 3.23 MIL/uL — ABNORMAL LOW (ref 4.22–5.81)
RDW: 13.2 % (ref 11.5–15.5)
RDW: 13.5 % (ref 11.5–15.5)
WBC: 7.7 10*3/uL (ref 4.0–10.5)
WBC: 10.9 10*3/uL — ABNORMAL HIGH (ref 4.0–10.5)
nRBC: 0 % (ref 0.0–0.2)
nRBC: 0 % (ref 0.0–0.2)

## 2020-10-29 LAB — MAGNESIUM
Magnesium: 2.9 mg/dL — ABNORMAL HIGH (ref 1.7–2.4)
Magnesium: 2.9 mg/dL — ABNORMAL HIGH (ref 1.7–2.4)

## 2020-10-29 LAB — BASIC METABOLIC PANEL
Anion gap: 7 (ref 5–15)
Anion gap: 8 (ref 5–15)
BUN: 13 mg/dL (ref 8–23)
BUN: 21 mg/dL (ref 8–23)
CO2: 23 mmol/L (ref 22–32)
CO2: 25 mmol/L (ref 22–32)
Calcium: 7.9 mg/dL — ABNORMAL LOW (ref 8.9–10.3)
Calcium: 8 mg/dL — ABNORMAL LOW (ref 8.9–10.3)
Chloride: 109 mmol/L (ref 98–111)
Chloride: 102 mmol/L (ref 98–111)
Creatinine, Ser: 1.14 mg/dL (ref 0.61–1.24)
Creatinine, Ser: 1.76 mg/dL — ABNORMAL HIGH (ref 0.61–1.24)
GFR, Estimated: >60 mL/min (ref >60)
GFR, Estimated: 43 mL/min — ABNORMAL LOW (ref >60)
Glucose, Bld: 111 mg/dL — ABNORMAL HIGH (ref 70–99)
Glucose, Bld: 113 mg/dL — ABNORMAL HIGH (ref 70–99)
Potassium: 3.9 mmol/L (ref 3.5–5.1)
Potassium: 4.3 mmol/L (ref 3.5–5.1)
Sodium: 139 mmol/L (ref 135–145)
Sodium: 135 mmol/L (ref 135–145)

## 2020-10-29 LAB — GLUCOSE, CAPILLARY
Glucose-Capillary: 110 mg/dL — ABNORMAL HIGH (ref 70–99)
Glucose-Capillary: 111 mg/dL — ABNORMAL HIGH (ref 70–99)
Glucose-Capillary: 109 mg/dL — ABNORMAL HIGH (ref 70–99)
Glucose-Capillary: 107 mg/dL — ABNORMAL HIGH (ref 70–99)
Glucose-Capillary: 131 mg/dL — ABNORMAL HIGH (ref 70–99)
Glucose-Capillary: 102 mg/dL — ABNORMAL HIGH (ref 70–99)
Glucose-Capillary: 123 mg/dL — ABNORMAL HIGH (ref 70–99)
Glucose-Capillary: 79 mg/dL (ref 70–99)
Glucose-Capillary: 107 mg/dL — ABNORMAL HIGH (ref 70–99)

## 2020-10-29 MED ORDER — FUROSEMIDE 10 MG/ML IJ SOLN
40.0000 mg | Freq: Once | INTRAMUSCULAR | Status: AC
  Administered 2020-10-29: 40 mg via INTRAVENOUS
  Filled 2020-10-29: qty 4

## 2020-10-29 MED ORDER — HYDRALAZINE HCL 25 MG PO TABS
25.0000 mg | ORAL_TABLET | Freq: Three times a day (TID) | ORAL | Status: DC
  Administered 2020-10-29 – 2020-11-01 (×10): 25 mg via ORAL
  Filled 2020-10-29 (×10): qty 1

## 2020-10-29 MED ORDER — INSULIN ASPART 100 UNIT/ML IJ SOLN
0.0000 [IU] | INTRAMUSCULAR | Status: DC
  Administered 2020-10-29 (×2): 2 [IU] via SUBCUTANEOUS

## 2020-10-29 MED ORDER — ISOSORBIDE MONONITRATE ER 30 MG PO TB24
30.0000 mg | ORAL_TABLET | Freq: Every day | ORAL | Status: DC
  Administered 2020-10-29 – 2020-11-01 (×4): 30 mg via ORAL
  Filled 2020-10-29 (×4): qty 1

## 2020-10-29 MED ORDER — ENOXAPARIN SODIUM 40 MG/0.4ML IJ SOSY
40.0000 mg | PREFILLED_SYRINGE | Freq: Every day | INTRAMUSCULAR | Status: DC
  Administered 2020-10-29 – 2020-11-01 (×4): 40 mg via SUBCUTANEOUS
  Filled 2020-10-29 (×4): qty 0.4

## 2020-10-29 MED ORDER — ATORVASTATIN CALCIUM 80 MG PO TABS
80.0000 mg | ORAL_TABLET | Freq: Every day | ORAL | Status: DC
  Administered 2020-10-29 – 2020-11-01 (×4): 80 mg via ORAL
  Filled 2020-10-29 (×4): qty 1

## 2020-10-29 MED ORDER — METOPROLOL TARTRATE 25 MG PO TABS
25.0000 mg | ORAL_TABLET | Freq: Two times a day (BID) | ORAL | Status: DC
  Administered 2020-10-29 (×2): 25 mg via ORAL
  Filled 2020-10-29 (×2): qty 1

## 2020-10-29 MED ORDER — POTASSIUM CHLORIDE 10 MEQ/50ML IV SOLN
10.0000 meq | INTRAVENOUS | Status: AC
Start: 2020-10-29 — End: 2020-10-29
  Administered 2020-10-29 (×3): 10 meq via INTRAVENOUS
  Filled 2020-10-29 (×3): qty 50

## 2020-10-29 MED ORDER — ORAL CARE MOUTH RINSE
15.0000 mL | Freq: Two times a day (BID) | OROMUCOSAL | Status: DC
  Administered 2020-10-29 – 2020-10-31 (×2): 15 mL via OROMUCOSAL

## 2020-10-29 MED ORDER — ALBUMIN HUMAN 5 % IV SOLN
12.5000 g | Freq: Once | INTRAVENOUS | Status: AC
  Administered 2020-10-29: 12.5 g via INTRAVENOUS
  Filled 2020-10-29: qty 250

## 2020-10-29 MED ORDER — METOPROLOL TARTRATE 25 MG/10 ML ORAL SUSPENSION: 25.0000 mg | Freq: Two times a day (BID) | ORAL | Status: DC

## 2020-10-29 NOTE — Progress Notes (Signed)
1 Day Post-Op Procedure(s) (LRB): CORONARY ARTERY BYPASS GRAFTING (CABG), ON PUMP, TIMES FIVE, USING LEFT INTERNAL MAMMARY ARTERY, LEFT RADIAL ARTERY AND ENDOSCOPICALLY HARVESTED RIGHT GREATER SAPHENOUS VEIN (N/A) LEFT RADIAL ARTERY HARVEST (Left) TRANSESOPHAGEAL ECHOCARDIOGRAM (TEE) (N/A) ENDOVEIN HARVEST OF RIGHT GREATER SAPHENOUS VEIN (Right) APPLICATION OF CELL SAVER Subjective: Denies pain but does stop when taking deep breath  Objective: Vital signs in last 24 hours: Temp:  [97.52 F (36.4 C)-100.8 F (38.2 C)] 99.5 F (37.5 C) (08/11 0700) Pulse Rate:  [68-82] 80 (08/11 0700) Cardiac Rhythm: Atrial paced (08/11 0000) Resp:  [8-31] 25 (08/11 0700) BP: (105-140)/(52-80) 105/70 (08/11 0600) SpO2:  [95 %-100 %] 96 % (08/11 0700) Arterial Line BP: (104-147)/(45-64) 135/57 (08/11 0700) FiO2 (%):  [40 %-50 %] 40 % (08/10 1742) Weight:  [97 kg] 97 kg (08/11 0500)  Hemodynamic parameters for last 24 hours: CVP:  [6 mmHg-17 mmHg] 12 mmHg  Intake/Output from previous day: 08/10 0701 - 08/11 0700 In: 5648.6 [P.O.:540; I.V.:3108.4; Blood:450; IV Piggyback:1550.2] Out: 2262 [Urine:1170; Blood:692; Chest Tube:400] Intake/Output this shift: No intake/output data recorded.  General appearance: alert, cooperative, and no distress Neurologic: intact Heart: regular rate and rhythm and + rub Lungs: diminished breath sounds bibasilar Abdomen: normal findings: soft, non-tender  Lab Results: Recent Labs    10/28/20 2009 10/29/20 0342  WBC 9.3 7.7  HGB 11.3* 10.2*  HCT 35.1* 31.7*  PLT 147* 128*   BMET:  Recent Labs    10/28/20 2009 10/29/20 0342  NA 139 139  K 3.9 3.9  CL 111 109  CO2 23 23  GLUCOSE 126* 111*  BUN 11 13  CREATININE 0.97 1.14  CALCIUM 7.6* 7.9*    PT/INR:  Recent Labs    10/28/20 1429  LABPROT 18.0*  INR 1.5*   ABG    Component Value Date/Time   PHART 7.352 10/28/2020 1859   HCO3 21.5 10/28/2020 1859   TCO2 23 10/28/2020 1859   ACIDBASEDEF  4.0 (H) 10/28/2020 1859   O2SAT 90.0 10/28/2020 1859   CBG (last 3)  Recent Labs    10/29/20 0235 10/29/20 0346 10/29/20 0506  GLUCAP 111* 109* 107*    Assessment/Plan: S/P Procedure(s) (LRB): CORONARY ARTERY BYPASS GRAFTING (CABG), ON PUMP, TIMES FIVE, USING LEFT INTERNAL MAMMARY ARTERY, LEFT RADIAL ARTERY AND ENDOSCOPICALLY HARVESTED RIGHT GREATER SAPHENOUS VEIN (N/A) LEFT RADIAL ARTERY HARVEST (Left) TRANSESOPHAGEAL ECHOCARDIOGRAM (TEE) (N/A) ENDOVEIN HARVEST OF RIGHT GREATER SAPHENOUS VEIN (Right) APPLICATION OF CELL SAVER POD # 1 NEURO- intact CV- in SR with good cardiac index  On IV NTG- transition to Imdur PO  ASA, beta blocker, statin  ECG- LVH with repol RESP- IS for atelectasis RENAL- creatinine OK  Diurese for volume overload ENDO- CBG well controlled GI- advance diet Anemia sec to ABL- mild, follow SCD + enoxaparin for DVT prohylaxis Dc chest tubes Mobilize   LOS: 1 day    Miguel Fletcher 10/29/2020

## 2020-10-29 NOTE — Progress Notes (Signed)
      [address]       [place] [address]             [phone]      PM labs show creatinine up to 1.7. Low UO  Will AAi pace at 90. Give albumin followed by IV lasix  Continue to monitor  Remo Lipps C. Roxan Hockey, MD [facility] [phone]

## 2020-10-29 NOTE — Progress Notes (Signed)
EVENING ROUNDS NOTE :     [address]       [place] [address]             [phone]                 1 Day Post-Op Procedure(s) (LRB): CORONARY ARTERY BYPASS GRAFTING (CABG), ON PUMP, TIMES FIVE, USING LEFT INTERNAL MAMMARY ARTERY, LEFT RADIAL ARTERY AND ENDOSCOPICALLY HARVESTED RIGHT GREATER SAPHENOUS VEIN (N/A) LEFT RADIAL ARTERY HARVEST (Left) TRANSESOPHAGEAL ECHOCARDIOGRAM (TEE) (N/A) ENDOVEIN HARVEST OF RIGHT GREATER SAPHENOUS VEIN (Right) APPLICATION OF CELL SAVER   Total Length of Stay:  LOS: 1 day  Events:   No events Resting comfortably    BP (!) 112/57 (BP Location: Right Arm)   Pulse (!) 57   Temp (!) 96.9 F (36.1 C)   Resp (!) 28   Ht '5\' 8"'$  (1.727 m)   Wt 97 kg   SpO2 96%   BMI 32.52 kg/m   CVP:  [6 mmHg-17 mmHg] 14 mmHg  Vent Mode: CPAP;PSV FiO2 (%):  [40 %] 40 % PEEP:  [5 cmH20] 5 cmH20 Pressure Support:  [10 cmH20] 10 cmH20   sodium chloride 10 mL/hr at 10/29/20 0800   sodium chloride     sodium chloride 10 mL/hr at 10/29/20 1600    ceFAZolin (ANCEF) IV Stopped (10/29/20 1406)   dexmedetomidine (PRECEDEX) IV infusion 0.41 mcg/kg/hr (10/29/20 0601)   lactated ringers     lactated ringers     lactated ringers Stopped (10/29/20 1141)   phenylephrine (NEO-SYNEPHRINE) Adult infusion Stopped (10/28/20 1515)    I/O last 3 completed shifts: In: 5648.6 [P.O.:540; I.V.:3108.4; Blood:450; IV Piggyback:1550.2] Out: 2262 [Urine:1170; Blood:692; Chest Tube:400]   CBC Latest Ref Rng & Units 10/29/2020 10/28/2020 10/28/2020  WBC 4.0 - 10.5 K/uL 7.7 9.3 -  Hemoglobin 13.0 - 17.0 g/dL 10.2(L) 11.3(L) 10.5(L)  Hematocrit 39.0 - 52.0 % 31.7(L) 35.1(L) 31.0(L)  Platelets 150 - 400 K/uL 128(L) 147(L) -    BMP Latest Ref Rng & Units 10/29/2020 10/28/2020 10/28/2020  Glucose 70 - 99 mg/dL 111(H) 126(H) -  BUN 8 - 23 mg/dL 13 11 -  Creatinine 0.61 - 1.24 mg/dL 1.14 0.97 -  Sodium 135 - 145 mmol/L 139 139 143  Potassium 3.5 - 5.1 mmol/L 3.9  3.9 3.8  Chloride 98 - 111 mmol/L 109 111 -  CO2 22 - 32 mmol/L 23 23 -  Calcium 8.9 - 10.3 mg/dL 7.9(L) 7.6(L) -    ABG    Component Value Date/Time   PHART 7.352 10/28/2020 1859   PCO2ART 38.9 10/28/2020 1859   PO2ART 63 (L) 10/28/2020 1859   HCO3 21.5 10/28/2020 1859   TCO2 23 10/28/2020 1859   ACIDBASEDEF 4.0 (H) 10/28/2020 1859   O2SAT 90.0 10/28/2020 1859       Melodie Bouillon, MD 10/29/2020 5:33 PM

## 2020-10-30 ENCOUNTER — Inpatient Hospital Stay (HOSPITAL_COMMUNITY): Payer: Self-pay

## 2020-10-30 LAB — BASIC METABOLIC PANEL
Anion gap: 8 (ref 5–15)
BUN: 24 mg/dL — ABNORMAL HIGH (ref 8–23)
CO2: 27 mmol/L (ref 22–32)
Calcium: 8.3 mg/dL — ABNORMAL LOW (ref 8.9–10.3)
Chloride: 100 mmol/L (ref 98–111)
Creatinine, Ser: 1.65 mg/dL — ABNORMAL HIGH (ref 0.61–1.24)
GFR, Estimated: 46 mL/min — ABNORMAL LOW (ref >60)
Glucose, Bld: 102 mg/dL — ABNORMAL HIGH (ref 70–99)
Potassium: 3.8 mmol/L (ref 3.5–5.1)
Sodium: 135 mmol/L (ref 135–145)

## 2020-10-30 LAB — CBC
HCT: 30.6 % — ABNORMAL LOW (ref 39.0–52.0)
Hemoglobin: 9.7 g/dL — ABNORMAL LOW (ref 13.0–17.0)
MCH: 31.1 pg (ref 26.0–34.0)
MCHC: 31.7 g/dL (ref 30.0–36.0)
MCV: 98.1 fL (ref 80.0–100.0)
Platelets: 142 10*3/uL — ABNORMAL LOW (ref 150–400)
RBC: 3.12 MIL/uL — ABNORMAL LOW (ref 4.22–5.81)
RDW: 13.6 % (ref 11.5–15.5)
WBC: 10.4 10*3/uL (ref 4.0–10.5)
nRBC: 0 % (ref 0.0–0.2)

## 2020-10-30 LAB — HEMOGLOBIN AND HEMATOCRIT, BLOOD
HCT: 30.5 % — ABNORMAL LOW (ref 39.0–52.0)
Hemoglobin: 10.2 g/dL — ABNORMAL LOW (ref 13.0–17.0)

## 2020-10-30 LAB — GLUCOSE, CAPILLARY
Glucose-Capillary: 92 mg/dL (ref 70–99)
Glucose-Capillary: 135 mg/dL — ABNORMAL HIGH (ref 70–99)
Glucose-Capillary: 93 mg/dL (ref 70–99)
Glucose-Capillary: 101 mg/dL — ABNORMAL HIGH (ref 70–99)

## 2020-10-30 MED ORDER — FUROSEMIDE 10 MG/ML IJ SOLN
40.0000 mg | Freq: Once | INTRAMUSCULAR | Status: AC
  Administered 2020-10-30: 40 mg via INTRAVENOUS
  Filled 2020-10-30: qty 4

## 2020-10-30 MED ORDER — CARVEDILOL 3.125 MG PO TABS
3.1250 mg | ORAL_TABLET | Freq: Two times a day (BID) | ORAL | Status: DC
  Administered 2020-10-30 – 2020-11-02 (×7): 3.125 mg via ORAL
  Filled 2020-10-30 (×7): qty 1

## 2020-10-30 MED ORDER — INSULIN ASPART 100 UNIT/ML IJ SOLN: 0.0000 [IU] | Freq: Three times a day (TID) | INTRAMUSCULAR | Status: DC

## 2020-10-30 NOTE — Progress Notes (Signed)
2 Days Post-Op Procedure(s) (LRB): CORONARY ARTERY BYPASS GRAFTING (CABG), ON PUMP, TIMES FIVE, USING LEFT INTERNAL MAMMARY ARTERY, LEFT RADIAL ARTERY AND ENDOSCOPICALLY HARVESTED RIGHT GREATER SAPHENOUS VEIN (N/A) LEFT RADIAL ARTERY HARVEST (Left) TRANSESOPHAGEAL ECHOCARDIOGRAM (TEE) (N/A) ENDOVEIN HARVEST OF RIGHT GREATER SAPHENOUS VEIN (Right) APPLICATION OF CELL SAVER Subjective: No complaints this AM, already walked  Objective: Vital signs in last 24 hours: Temp:  [96.9 F (36.1 C)-99.3 F (37.4 C)] 98.5 F (36.9 C) (08/12 0338) Pulse Rate:  [56-91] 90 (08/12 0648) Cardiac Rhythm: Atrial paced (08/12 0400) Resp:  [12-31] 12 (08/12 0648) BP: (102-154)/(57-91) 154/77 (08/12 0609) SpO2:  [88 %-98 %] 93 % (08/12 0648) Arterial Line BP: (137)/(56) 137/56 (08/11 0800) Weight:  [101.6 kg] 101.6 kg (08/12 0500)  Hemodynamic parameters for last 24 hours: CVP:  [14 mmHg] 14 mmHg  Intake/Output from previous day: 08/11 0701 - 08/12 0700 In: 1267.6 [P.O.:480; I.V.:253.5; IV Piggyback:534.1] Out: 1280 [Urine:1190; Chest Tube:90] Intake/Output this shift: No intake/output data recorded.  General appearance: alert, cooperative, and no distress Neurologic: intact Heart: regular rate and rhythm Lungs: diminished breath sounds bibasilar Abdomen: normal findings: soft, non-tender Wound: clean and dry  Lab Results: Recent Labs    10/29/20 1730 10/30/20 0439  WBC 10.9* 10.4  HGB 10.2* 9.7*  HCT 31.6* 30.6*  PLT 146* 142*   BMET:  Recent Labs    10/29/20 1730 10/30/20 0439  NA 135 135  K 4.3 3.8  CL 102 100  CO2 25 27  GLUCOSE 113* 102*  BUN 21 24*  CREATININE 1.76* 1.65*  CALCIUM 8.0* 8.3*    PT/INR:  Recent Labs    10/28/20 1429  LABPROT 18.0*  INR 1.5*   ABG    Component Value Date/Time   PHART 7.352 10/28/2020 1859   HCO3 21.5 10/28/2020 1859   TCO2 23 10/28/2020 1859   ACIDBASEDEF 4.0 (H) 10/28/2020 1859   O2SAT 90.0 10/28/2020 1859   CBG (last 3)   Recent Labs    10/29/20 1922 10/29/20 2318 10/30/20 0324  GLUCAP 79 107* 92    Assessment/Plan: S/P Procedure(s) (LRB): CORONARY ARTERY BYPASS GRAFTING (CABG), ON PUMP, TIMES FIVE, USING LEFT INTERNAL MAMMARY ARTERY, LEFT RADIAL ARTERY AND ENDOSCOPICALLY HARVESTED RIGHT GREATER SAPHENOUS VEIN (N/A) LEFT RADIAL ARTERY HARVEST (Left) TRANSESOPHAGEAL ECHOCARDIOGRAM (TEE) (N/A) ENDOVEIN HARVEST OF RIGHT GREATER SAPHENOUS VEIN (Right) APPLICATION OF CELL SAVER - POD # 2 Looks great NEURO- intact CV- in Sr in 60s- will change pacer to backup  Hypertension/ LVH- BP well controlled with Imdur, metoprolol and hydralazine  ASA, statin, beta blocker  RESP- IS for basilar atelectasis RENAL- creatinine down slightly. C/w acute kidney injury now resolving  IV lasix this Am, DC Foley later today ENDO- CBG well controlled- change SSI to [facility] GI- tolerating PO Anemia stable Continue cardiac rehab Possible transfer to 4E later today   LOS: 2 days    Melrose Nakayama 10/30/2020

## 2020-10-30 NOTE — Discharge Instructions (Signed)

## 2020-10-30 NOTE — Progress Notes (Signed)
EVENING ROUNDS NOTE :     [place]       [place] [address]             [phone]                 2 Days Post-Op Procedure(s) (LRB): CORONARY ARTERY BYPASS GRAFTING (CABG), ON PUMP, TIMES FIVE, USING LEFT INTERNAL MAMMARY ARTERY, LEFT RADIAL ARTERY AND ENDOSCOPICALLY HARVESTED RIGHT GREATER SAPHENOUS VEIN (N/A) LEFT RADIAL ARTERY HARVEST (Left) TRANSESOPHAGEAL ECHOCARDIOGRAM (TEE) (N/A) ENDOVEIN HARVEST OF RIGHT GREATER SAPHENOUS VEIN (Right) APPLICATION OF CELL SAVER   Total Length of Stay:  LOS: 2 days  Events:   No events Resting in chair    BP (!) 161/81   Pulse 75   Temp 98.3 F (36.8 C) (Oral)   Resp (!) 21   Ht '5\' 8"'$  (1.727 m)   Wt 101.6 kg   SpO2 91%   BMI 34.06 kg/m          sodium chloride 10 mL/hr at 10/29/20 0800   sodium chloride     sodium chloride Stopped (10/30/20 0943)   lactated ringers     lactated ringers     lactated ringers Stopped (10/29/20 1141)    I/O last 3 completed shifts: In: 2058 [P.O.:1140; I.V.:283.9; IV Piggyback:634.1] Out: 1645 [Urine:1555; Chest Tube:90]   CBC Latest Ref Rng & Units 10/30/2020 10/29/2020 10/29/2020  WBC 4.0 - 10.5 K/uL 10.4 10.9(H) 7.7  Hemoglobin 13.0 - 17.0 g/dL 9.7(L) 10.2(L) 10.2(L)  Hematocrit 39.0 - 52.0 % 30.6(L) 31.6(L) 31.7(L)  Platelets 150 - 400 K/uL 142(L) 146(L) 128(L)    BMP Latest Ref Rng & Units 10/30/2020 10/29/2020 10/29/2020  Glucose 70 - 99 mg/dL 102(H) 113(H) 111(H)  BUN 8 - 23 mg/dL 24(H) 21 13  Creatinine 0.61 - 1.24 mg/dL 1.65(H) 1.76(H) 1.14  Sodium 135 - 145 mmol/L 135 135 139  Potassium 3.5 - 5.1 mmol/L 3.8 4.3 3.9  Chloride 98 - 111 mmol/L 100 102 109  CO2 22 - 32 mmol/L '27 25 23  '$ Calcium 8.9 - 10.3 mg/dL 8.3(L) 8.0(L) 7.9(L)    ABG    Component Value Date/Time   PHART 7.352 10/28/2020 1859   PCO2ART 38.9 10/28/2020 1859   PO2ART 63 (L) 10/28/2020 1859   HCO3 21.5 10/28/2020 1859   TCO2 23 10/28/2020 1859   ACIDBASEDEF 4.0 (H) 10/28/2020 1859    O2SAT 90.0 10/28/2020 1859       Miguel Bouillon, MD 10/30/2020 7:08 PM

## 2020-10-30 NOTE — Discharge Summary (Addendum)
Physician Discharge Summary       [place]       [place] [address]             [phone]    Patient ID: Miguel Fletcher MRN: [id number] DOB/AGE: 06/05/1955 65 y.o.  Admit date: 10/28/2020 Discharge date: 11/02/2020  Admission Diagnoses: Coronary artery disease Uncontrolled hypertension Severe left-ventricular hypertrophy  Discharge Diagnoses:  1.  S/P CABG x 5 2. Expected post op blood loss anemia 3. 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 08/2020 MRI [facility]): Well-circumscribed T1 hypointense, T2 hyperintense enhancing mass within the right axilla measuring 3.1 x 2.6 x 3.0 cm.  Mass extends along the course of the axillary neurovascular structures.  No evidence for invasion into the adjacent musculature.  No enlarged axillary lymph nodes.   Bell's palsy     Biliary colic    Diastolic dysfunction       a. 09/2020 Echo: EF 60-65%, no rwma, sev LVH. Gr1 DD. Nl RV size/fxn. Mod dil LA.   Hemorrhoids     Hypercholesterolemia     Hypertension     Stroke (cerebrum) (HCC)     Tobacco use      Consults: None  Procedure (s):  Median sternotomy, extracorporeal circulation, coronary artery bypass grafting x5 (left internal mammary artery to LAD, left radial artery to ramus intermedius, saphenous vein graft to first diagonal, sequential saphenous vein  graft to posterior descending and posterolateral), endoscopic vein harvest right thigh, open left radial harvest by Dr. Roxan Hockey on 10/28/2020.   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  He has had a mass in his right axilla for "4 to 5 years.  Intermittently he gets numbness and and/or tingling in his fingers associated with that.  About 3 weeks ago he was having a lot of pain in the axilla and  went to the emergency room at [facility].  A cardiac work-up was initiated.  He was severely hypertensive.  A CT angiogram showed no evidence of aortic dissection.  His high-sensitivity troponin was 75.  He refused to stay for further testing.  He went back to the emergency room a few days later saying he just did not feel well.  When asked further he said he was having a lot of indigestion and felt like he had to belch.  He did get some relief with belching but it was incomplete.  He was found to have an elevated troponin of 144.  He underwent cardiac catheterization which showed a 70% left main stenosis and moderate three-vessel disease.  Of note he had tested positive for COVID during his first hospital admission.  He was prescribed Coreg 3.125 mg twice daily and hydralazine 100 mg 3 times daily.  He also was put on Lipitor 40 mg daily and 81 mg aspirin a day.  He has not been taking his  hydralazine because he says it makes him feel dizzy.  Dr. Roxan Hockey discussed the need for coronary artery bypass grafting surgery. Potential risks, benefits, and complications of the surgery were discussed with the patient and he agreed to proceed with surgery. Carotid duplex US showed no significant internal carotid artery stenosis bilaterally. Patient presented to [facility] on 10/28/2020 in order to undergo a CABG x 5.  Brief Hospital Course:  Patient was transferred from the OR to the ICU in stable condition. The patient was extubated early the evening of surgery without difficulty. He remained afebrile and hemodynamically stable. He was weaned off Nitro and Neo Synephrine drips. He was initially A paced. Gordy Councilman, a line, chest tubes, and foley were removed early in the post operative course. Lopressor was later started and titrated accordingly. He was also started on Imdur (radial artery harvest). He was volume over loaded and diuresed. He had ABL anemia. He did not require a post op transfusion. Last H and H  was 10.2 and 30.5. He also had mild thrombocytopenia. His last platelet count was 131,00. He was weaned off the insulin drip.  The patient's glucose remained well controlled. The patient's HGA1C pre op was  5.3. The patient was felt surgically stable for transfer from the ICU to PCTU for further convalescence on 08/12.  He continues to progress with cardiac rehab. He was ambulating on room air. He has been tolerating a diet and has had a bowel movement. Epicardial pacing wires were removed on 08/13.  He was hypertensive and his home agents were resumed.  He is maintaining NSR. Chest tube sutures will be removed the day of discharge. The patient is felt surgically stable for discharge today.    Latest Vital Signs: Blood pressure (!) 170/90, pulse 89, temperature 98.8 F (37.1 C), temperature source Oral, resp. rate 17, height '5\' 8"'$  (1.727 m), weight 95.8 kg, SpO2 97 %.  Physical Exam:  General appearance: alert, cooperative, and no distress Heart: regular rate and rhythm Lungs: clear to auscultation bilaterally Abdomen: soft, non-tender; bowel sounds normal; no masses,  no organomegaly Extremities: edema trace Wound: clean and dry  Discharge Condition:Stable and discharged to home.  Recent laboratory studies:  Lab Results  Component Value Date   WBC 7.8 10/31/2020   HGB 9.9 (L) 10/31/2020   HCT 29.6 (L) 10/31/2020   MCV 95.5 10/31/2020   PLT 154 10/31/2020   Lab Results  Component Value Date   NA 134 (L) 10/31/2020   K 3.7 10/31/2020   CL 102 10/31/2020   CO2 25 10/31/2020   CREATININE 1.07 10/31/2020   GLUCOSE 93 10/31/2020      Diagnostic Studies: DG Chest 2 View  Result Date: 10/31/2020 CLINICAL DATA:  Coronary artery bypass grafting EXAM: CHEST - 2 VIEW COMPARISON:  10/30/2020 FINDINGS: Cardiac shadow is enlarged but stable. Postsurgical changes are again seen. Right jugular central line has been removed in the interval. Lungs are hypoinflated but clear. Pneumothorax is  seen. No bony abnormality is noted. IMPRESSION: Improved aeration in the left base. No acute abnormality noted. Electronically Signed   By: Inez Catalina M.D.   On: 10/31/2020 18:03   DG Chest 2 View  Result Date: 10/27/2020 CLINICAL DATA:  Preop.  Coronary artery disease. EXAM: CHEST - 2 VIEW COMPARISON:  October 05, 2020. FINDINGS: The heart size and mediastinal contours are within normal limits. Both lungs are clear. The visualized skeletal structures are unremarkable. IMPRESSION: No active cardiopulmonary disease. Electronically Signed   By: Marijo Conception M.D.   On: 10/27/2020 07:40   CARDIAC CATHETERIZATION  Result Date: 10/06/2020 Formatting of this result is different from the original.   Dist LM to Prox LAD lesion is 70% stenosed.   Ramus lesion is 70% stenosed.   2nd Diag lesion is 60% stenosed.   Mid RCA lesion is 40% stenosed.   RPDA lesion is 50% stenosed.   RPAV lesion  is 50% stenosed. 1.  Moderately severe, hemodynamically significant distal left main disease extending into the proximal LAD, confirmed by intravascular ultrasound assessment with minimal lumen area approximately 4 mm. 2.  Moderately severe eccentric stenosis of the ramus intermedius branch 3.  Patent left circumflex with mild nonobstructive disease 4.  Patent RCA with mild to moderate mid vessel stenosis and moderate stenosis involving the PDA and PLA branch vessels. 5.  Systemic hypertension The patient has surgical coronary anatomy.  His coronary disease is clearly significant but not critical and it would be reasonable to treat him with medical therapy, control his blood pressure, allow him to recover from [place], and arrange outpatient cardiac surgical consultation for consideration of CABG.   DG Chest Port 1 View  Result Date: 10/30/2020 CLINICAL DATA:  Status post coronary bypass graft. EXAM: PORTABLE CHEST 1 VIEW COMPARISON:  October 29, 2020. FINDINGS: Stable cardiomegaly. Status post coronary bypass graft. Right internal  jugular catheter is unchanged in position. No pneumothorax is noted. Right lung is clear. Stable left basilar atelectasis is noted with probable associated pleural effusion. Left-sided chest tube has been removed. Bony thorax is unremarkable. IMPRESSION: No pneumothorax status post left-sided chest tube removal. Stable left basilar atelectasis with associated pleural effusion. Electronically Signed   By: Marijo Conception M.D.   On: 10/30/2020 08:34   DG Chest Port 1 View  Result Date: 10/29/2020 CLINICAL DATA:  Status post CABG EXAM: PORTABLE CHEST 1 VIEW COMPARISON:  10/28/2020 FINDINGS: Status post median sternotomy and CABG. Interval removal of previously noted endotracheal and nasogastric tubes. Unchanged position of right IJ catheter, with tip approximating the low SVC. Bilateral chest tubes. Unchanged enlarged cardiac and mediastinal contours. Slightly increased interstitial opacities in the left lower lung, with a small left pleural effusion. Bibasilar atelectasis. Imaged abdomen is unremarkable. No acute osseous abnormality. IMPRESSION: 1. Interval removal of endotracheal and nasogastric tube. Otherwise stable support apparatus. 2. Increased interstitial opacities in the left lower lung, likely pulmonary edema, with a small pleural effusion. Electronically Signed   By: Merilyn Baba MD   On: 10/29/2020 09:02   DG Chest Port 1 View  Result Date: 10/28/2020 CLINICAL DATA:  Status post coronary bypass graft. EXAM: PORTABLE CHEST 1 VIEW COMPARISON:  October 26, 2020. FINDINGS: Endotracheal and nasogastric tubes are in grossly good position. Right internal jugular catheter is noted with tip in expected position of the SVC. Left-sided chest tube is noted without pneumothorax. Mild bibasilar subsegmental atelectasis is noted. Bony thorax is unremarkable. IMPRESSION: Swore apparatus in grossly good position. Mild bibasilar subsegmental atelectasis is noted. No pneumothorax is noted. Electronically Signed   By:  Marijo Conception M.D.   On: 10/28/2020 15:27   DG Chest Portable 1 View  Result Date: 10/05/2020 CLINICAL DATA:  Shortness of breath.  Chest pain. EXAM: PORTABLE CHEST 1 VIEW COMPARISON:  10/01/2020 FINDINGS: 0825 hours. The lungs are clear without focal pneumonia, edema, pneumothorax or pleural effusion. Interstitial markings are diffusely coarsened with chronic features. Cardiopericardial silhouette is at upper limits of normal for size. The visualized bony structures of the thorax show no acute abnormality. Telemetry leads overlie the chest. IMPRESSION: No active disease. Electronically Signed   By: Misty Stanley M.D.   On: 10/05/2020 09:00   VAS [place] LOWER EXT SAPHENOUS VEIN MAPPING  Result Date: 10/26/2020 LOWER EXTREMITY VEIN MAPPING Patient Name:  Miguel Fletcher  Date of Exam:   10/26/2020 Medical Rec #: [id number]  Accession #:    [id number] Date of Birth: 1955/04/18          Patient Gender: M Patient Age:   49 years Exam Location:  [facility] Procedure:      VAS [place] LOWER EXTREMITY SAPHENOUS VEIN MAPPING Referring Phys: Remo Lipps Aevah Stansbery --------------------------------------------------------------------------------  Indications:  Pre-op Risk Factors: Hypertension, current smoker, coronary artery disease.  Comparison Study: No prior studies. Performing Technologist: Oliver Hum RVT  Examination Guidelines: A complete evaluation includes B-mode imaging, spectral Doppler, color Doppler, and power Doppler as needed of all accessible portions of each vessel. Bilateral testing is considered an integral part of a complete examination. Limited examinations for reoccurring indications may be performed as noted. +---------------+-----------+----------------------+---------------+-----------+   RT Diameter  RT Findings         GSV            LT Diameter  LT Findings      (cm)                                            (cm)                   +---------------+-----------+----------------------+---------------+-----------+      0.57                     Saphenofemoral                                                                Junction                                  +---------------+-----------+----------------------+---------------+-----------+      0.59       branching     Proximal thigh                               +---------------+-----------+----------------------+---------------+-----------+      0.46       branching       Mid thigh                                  +---------------+-----------+----------------------+---------------+-----------+      0.43                      Distal thigh                                +---------------+-----------+----------------------+---------------+-----------+      0.33                          Knee                                    +---------------+-----------+----------------------+---------------+-----------+      0.33       branching  Prox calf                                  +---------------+-----------+----------------------+---------------+-----------+      0.41       branching        Mid calf                                  +---------------+-----------+----------------------+---------------+-----------+      0.35                      Distal calf                                 +---------------+-----------+----------------------+---------------+-----------+ Diagnosing physician: Monica Martinez MD Electronically signed by Monica Martinez MD on 10/26/2020 at 4:36:17 PM.    Final    ECHO INTRAOPERATIVE TEE  Result Date: 10/28/2020  *INTRAOPERATIVE TRANSESOPHAGEAL REPORT *  Patient Name:   Miguel Fletcher Date of Exam: 10/28/2020 Medical Rec #:  [id number]         Height:       68.0 in Accession #:    [id number]        Weight:       214.9 lb Date of Birth:  Feb 23, 1956         BSA:          2.11 m Patient Age:    26 years          BP:            165/67 mmHg Patient Gender: M                 HR:           60 bpm. Exam Location:  Anesthesiology Transesophogeal exam was perform intraoperatively during surgical procedure. Patient was closely monitored under general anesthesia during the entirety of examination. Indications:     Coronary Artery Disease Sonographer:     Bernadene Person RDCS Performing Phys: [place] Diagnosing Phys: Hoy Morn MD Complications: No known complications during this procedure. POST-OP IMPRESSIONS _ Left Ventricle: The left ventricle is unchanged from pre-bypass. _ Right Ventricle: The right ventricle appears unchanged from pre-bypass. _ Aorta: The aorta appears unchanged from pre-bypass. _ Left Atrium: The left atrium appears unchanged from pre-bypass. _ Left Atrial Appendage: The left atrial appendage appears unchanged from pre-bypass. _ Aortic Valve: The aortic valve appears unchanged from pre-bypass. _ Mitral Valve: The mitral valve appears unchanged from pre-bypass. _ Tricuspid Valve: The tricuspid valve appears unchanged from pre-bypass. _ Pulmonic Valve: The pulmonic valve appears unchanged from pre-bypass. _ Interatrial Septum: The interatrial septum appears unchanged from pre-bypass. _ Interventricular Septum: The interventricular septum appears unchanged from pre-bypass. _ Pericardium: The pericardium appears unchanged from pre-bypass. _ Comments: Post-bypass images reviewed with surgeon. PRE-OP FINDINGS  Left Ventricle: The left ventricle has normal systolic function, with an ejection fraction of 60-65%. The cavity size was normal. There is severe concentric left ventricular hypertrophy. Right Ventricle: The right ventricle has normal systolic function. The cavity was normal. There is no increase in right ventricular wall thickness. Left Atrium: Left atrial size was dilated. No left atrial/left atrial appendage thrombus was detected. Left atrial appendage velocity is normal at greater than 40 cm/s.  Right Atrium: Right atrial  size was normal in size. Interatrial Septum: No atrial level shunt detected by color flow Doppler. Pericardium: Trivial pericardial effusion is present. Mitral Valve: The mitral valve is normal in structure. Mitral valve regurgitation is trivial by color flow Doppler. Tricuspid Valve: The tricuspid valve was normal in structure. Tricuspid valve regurgitation was not visualized by color flow Doppler. Aortic Valve: The aortic valve is tricuspid Aortic valve regurgitation was not visualized by color flow Doppler. Pulmonic Valve: The pulmonic valve was normal in structure. Pulmonic valve regurgitation is not visualized by color flow Doppler. Aorta: There is evidence of plaque in the ascending aorta, descending aorta and aortic arch. Pulmonary Artery: The pulmonary artery is of normal size.  Hoy Morn MD Electronically signed by Hoy Morn MD Signature Date/Time: 10/28/2020/3:55:46 PM    Final    VAS US DOPPLER PRE CABG  Result Date: 10/26/2020 PREOPERATIVE VASCULAR EVALUATION Patient Name:  Miguel Fletcher  Date of Exam:   10/26/2020 Medical Rec #: [id number]          Accession #:    [id number] Date of Birth: 12-15-1955          Patient Gender: M Patient Age:   61 years Exam Location:  [facility] Procedure:      VAS US DOPPLER PRE CABG Referring Phys: Remo Lipps Braelyn Bordonaro --------------------------------------------------------------------------------  Indications:      Pre-CABG. Risk Factors:     Hypertension, current smoker, coronary artery disease. Comparison Study: No prior studies. Performing Technologist: Carlos Levering RVT  Examination Guidelines: A complete evaluation includes B-mode imaging, spectral Doppler, color Doppler, and power Doppler as needed of all accessible portions of each vessel. Bilateral testing is considered an integral part of a complete examination. Limited examinations for reoccurring indications may be performed as noted.  Right Carotid Findings:  +----------+--------+--------+--------+-----------------------+--------+           PSV cm/sEDV cm/sStenosisDescribe               Comments +----------+--------+--------+--------+-----------------------+--------+ CCA Prox  126     15              smooth and heterogenoustortuous +----------+--------+--------+--------+-----------------------+--------+ CCA Distal71      15              smooth and heterogenous         +----------+--------+--------+--------+-----------------------+--------+ ICA Prox  96      20              smooth and heterogenous         +----------+--------+--------+--------+-----------------------+--------+ ICA Distal81      29                                     tortuous +----------+--------+--------+--------+-----------------------+--------+ ECA       171                                                     +----------+--------+--------+--------+-----------------------+--------+ +----------+--------+-------+--------+------------+           PSV cm/sEDV cmsDescribeArm Pressure +----------+--------+-------+--------+------------+ Subclavian251                                 +----------+--------+-------+--------+------------+ +---------+--------+--+--------+--+---------+ VertebralPSV cm/s58EDV cm/s21Antegrade +---------+--------+--+--------+--+---------+ Left Carotid Findings: +----------+--------+--------+--------+-----------------------+--------+  PSV cm/sEDV cm/sStenosisDescribe               Comments +----------+--------+--------+--------+-----------------------+--------+ CCA Prox  117     15              smooth and heterogenous         +----------+--------+--------+--------+-----------------------+--------+ CCA Distal73      13              smooth and heterogenous         +----------+--------+--------+--------+-----------------------+--------+ ICA Prox  120     29              smooth and heterogenous          +----------+--------+--------+--------+-----------------------+--------+ ICA Distal82      31                                     tortuous +----------+--------+--------+--------+-----------------------+--------+ ECA       260     16                                              +----------+--------+--------+--------+-----------------------+--------+ +----------+--------+--------+--------+------------+ SubclavianPSV cm/sEDV cm/sDescribeArm Pressure +----------+--------+--------+--------+------------+           232                                  +----------+--------+--------+--------+------------+ +---------+--------+---+--------+--+---------+ VertebralPSV cm/s137EDV cm/s39Antegrade +---------+--------+---+--------+--+---------+  ABI Findings: +--------+------------------+-----+---------+--------+ Right   Rt Pressure (mmHg)IndexWaveform Comment  +--------+------------------+-----+---------+--------+ [id number]                    triphasic         +--------+------------------+-----+---------+--------+ PTA     113               0.70 biphasic          +--------+------------------+-----+---------+--------+ DP      102               0.63 biphasic          +--------+------------------+-----+---------+--------+ +--------+------------------+-----+----------+-------+ Left    Lt Pressure (mmHg)IndexWaveform  Comment +--------+------------------+-----+----------+-------+ [id number]                    triphasic         +--------+------------------+-----+----------+-------+ PTA     119               0.74 monophasic        +--------+------------------+-----+----------+-------+ DP      124               0.77 monophasic        +--------+------------------+-----+----------+-------+ +-------+---------------+----------------+ ABI/TBIToday's ABI/TBIPrevious ABI/TBI +-------+---------------+----------------+ Right  0.7                              +-------+---------------+----------------+ Left   0.77                            +-------+---------------+----------------+  Right Doppler Findings: +--------+--------+-----+---------+--------+ Site    PressureIndexDoppler  Comments +--------+--------+-----+---------+--------+ [id number]          triphasic         +--------+--------+-----+---------+--------+ Radial  triphasic         +--------+--------+-----+---------+--------+ Ulnar                triphasic         +--------+--------+-----+---------+--------+  Left Doppler Findings: +--------+--------+-----+---------+--------+ Site    PressureIndexDoppler  Comments +--------+--------+-----+---------+--------+ [id number]          triphasic         +--------+--------+-----+---------+--------+ Radial               triphasic         +--------+--------+-----+---------+--------+ Ulnar                triphasic         +--------+--------+-----+---------+--------+  Summary: Right Carotid: Velocities in the right ICA are consistent with a 1-39% stenosis. Left Carotid: Velocities in the left ICA are consistent with a 1-39% stenosis. Vertebrals: Bilateral vertebral arteries demonstrate antegrade flow. Right ABI: Resting right ankle-brachial index indicates moderate right lower extremity arterial disease. Left ABI: Resting left ankle-brachial index indicates moderate left lower extremity arterial disease. Right Upper Extremity: Doppler waveform obliterate with right radial compression. Doppler waveforms remain within normal limits with right ulnar compression. Left Upper Extremity: Doppler waveform obliterate with left radial compression. Doppler waveforms remain within normal limits with left ulnar compression.  Electronically signed by Monica Martinez MD on 10/26/2020 at 4:36:49 PM.    Final     Discharge Instructions     Amb Referral to Cardiac Rehabilitation   Complete by: As directed    Diagnosis:  CABG   CABG X ___: 5   After initial evaluation and assessments completed: Virtual Based Care may be provided alone or in conjunction with Phase 2 Cardiac Rehab based on patient barriers.: Yes       Discharge Medications: Allergies as of 11/02/2020   No Known Allergies      Medication List     STOP taking these medications    nitroGLYCERIN 0.4 MG SL tablet Commonly known as: NITROSTAT       TAKE these medications    amLODipine 10 MG tablet Commonly known as: NORVASC Take 1 tablet (10 mg total) by mouth daily.   aspirin 325 MG EC tablet Take 1 tablet (325 mg total) by mouth daily. What changed:  medication strength how much to take   atorvastatin 80 MG tablet Commonly known as: LIPITOR Take 1 tablet (80 mg total) by mouth daily at 8 pm. What changed:  medication strength how much to take when to take this   carvedilol 3.125 MG tablet Commonly known as: COREG Take 1 tablet (3.125 mg total) by mouth 2 (two) times daily with a meal.   hydrALAZINE 100 MG tablet Commonly known as: APRESOLINE Take 0.5 tablets (50 mg total) by mouth 3 (three) times daily.   isosorbide mononitrate 60 MG 24 hr tablet Commonly known as: IMDUR Take 1 tablet (60 mg total) by mouth daily.   lisinopril 40 MG tablet Commonly known as: ZESTRIL Take 1 tablet (40 mg total) by mouth daily.   oxyCODONE 5 MG immediate release tablet Commonly known as: Oxy IR/ROXICODONE Take 1-2 tablets (5-10 mg total) by mouth every 4 (four) hours as needed for severe pain.       The patient has been discharged on:   1.Beta Blocker:  Yes [ x  ]  No   [   ]                              If No, reason:  2.Ace Inhibitor/ARB: Yes [  x ]                                     No  [    ]                                     If No, reason:  3.Statin:   Yes [  x ]                  No  [   ]                  If No, reason:  4.Ecasa:  Yes  [  x ]                  No   [   ]                   If No, reason:  Patient had ACS upon admission:  Plavix/P2Y12 inhibitor: Yes [   ]                                      No  [  x ]   Follow Up Appointments:  Follow-up Information     Melrose Nakayama, MD Follow up.   Specialty: Cardiothoracic Surgery Contact information: [address] [place] [address] [phone]         Theora Gianotti, NP. Go on 11/09/2020.   Specialties: Nurse Practitioner, Cardiology, Radiology Why: Appointment time is at 2:00 pm Contact information: [address] [place] [address] [phone]                 Signed: Ellamae Sia 11/02/2020, 7:17 AM

## 2020-10-30 NOTE — Progress Notes (Signed)
CARDIAC REHAB PHASE I   PRE:  Rate/Rhythm: 72 SR  BP:  Supine: 132/74  Sitting:  Standing:    SaO2: 93% 2L  MODE:  Ambulation: 370 ft   POST:  Rate/Rhythm: 83 SR  BP:  Supine:   Sitting: 149/85  Standing:    SaO2: 92% 2L 1330-1418 Pt walked 370 ft on 2L with EVA and asst x 1. Stopped once to rest. Did not want to go to chair after walk. Used IS and got to 500 ml prior to walk. Pacer,foley intact after walk. Tolerated well.   Graylon Good, RN BSN  10/30/2020 2:15 PM

## 2020-10-31 ENCOUNTER — Inpatient Hospital Stay (HOSPITAL_COMMUNITY): Payer: Self-pay

## 2020-10-31 LAB — BASIC METABOLIC PANEL
Anion gap: 7 (ref 5–15)
Anion gap: 7 (ref 5–15)
BUN: 27 mg/dL — ABNORMAL HIGH (ref 8–23)
BUN: 25 mg/dL — ABNORMAL HIGH (ref 8–23)
CO2: 24 mmol/L (ref 22–32)
CO2: 25 mmol/L (ref 22–32)
Calcium: 8.5 mg/dL — ABNORMAL LOW (ref 8.9–10.3)
Calcium: 8.2 mg/dL — ABNORMAL LOW (ref 8.9–10.3)
Chloride: 101 mmol/L (ref 98–111)
Chloride: 102 mmol/L (ref 98–111)
Creatinine, Ser: 1.22 mg/dL (ref 0.61–1.24)
Creatinine, Ser: 1.07 mg/dL (ref 0.61–1.24)
GFR, Estimated: >60 mL/min (ref >60)
GFR, Estimated: >60 mL/min (ref >60)
Glucose, Bld: 94 mg/dL (ref 70–99)
Glucose, Bld: 93 mg/dL (ref 70–99)
Potassium: 3.9 mmol/L (ref 3.5–5.1)
Potassium: 3.7 mmol/L (ref 3.5–5.1)
Sodium: 132 mmol/L — ABNORMAL LOW (ref 135–145)
Sodium: 134 mmol/L — ABNORMAL LOW (ref 135–145)

## 2020-10-31 LAB — CBC
HCT: 30.5 % — ABNORMAL LOW (ref 39.0–52.0)
HCT: 29.6 % — ABNORMAL LOW (ref 39.0–52.0)
Hemoglobin: 10.2 g/dL — ABNORMAL LOW (ref 13.0–17.0)
Hemoglobin: 9.9 g/dL — ABNORMAL LOW (ref 13.0–17.0)
MCH: 31.8 pg (ref 26.0–34.0)
MCH: 31.9 pg (ref 26.0–34.0)
MCHC: 33.4 g/dL (ref 30.0–36.0)
MCHC: 33.4 g/dL (ref 30.0–36.0)
MCV: 95 fL (ref 80.0–100.0)
MCV: 95.5 fL (ref 80.0–100.0)
Platelets: 131 10*3/uL — ABNORMAL LOW (ref 150–400)
Platelets: 154 10*3/uL (ref 150–400)
RBC: 3.21 MIL/uL — ABNORMAL LOW (ref 4.22–5.81)
RBC: 3.1 MIL/uL — ABNORMAL LOW (ref 4.22–5.81)
RDW: 13.4 % (ref 11.5–15.5)
RDW: 13.3 % (ref 11.5–15.5)
WBC: 9.3 10*3/uL (ref 4.0–10.5)
WBC: 7.8 10*3/uL (ref 4.0–10.5)
nRBC: 0 % (ref 0.0–0.2)
nRBC: 0 % (ref 0.0–0.2)

## 2020-10-31 LAB — GLUCOSE, CAPILLARY
Glucose-Capillary: 104 mg/dL — ABNORMAL HIGH (ref 70–99)
Glucose-Capillary: 96 mg/dL (ref 70–99)
Glucose-Capillary: 85 mg/dL (ref 70–99)
Glucose-Capillary: 82 mg/dL (ref 70–99)

## 2020-10-31 MED ORDER — SODIUM CHLORIDE 0.9% FLUSH
3.0000 mL | Freq: Two times a day (BID) | INTRAVENOUS | Status: DC
  Administered 2020-10-31 – 2020-11-02 (×4): 3 mL via INTRAVENOUS

## 2020-10-31 MED ORDER — ZOLPIDEM TARTRATE 5 MG PO TABS
5.0000 mg | ORAL_TABLET | Freq: Every evening | ORAL | Status: DC | PRN
  Administered 2020-11-02: 5 mg via ORAL
  Filled 2020-10-31: qty 1

## 2020-10-31 MED ORDER — MAGNESIUM HYDROXIDE 400 MG/5ML PO SUSP
30.0000 mL | Freq: Every day | ORAL | Status: DC | PRN
  Filled 2020-10-31: qty 30

## 2020-10-31 MED ORDER — ~~LOC~~ CARDIAC SURGERY, PATIENT & FAMILY EDUCATION: Freq: Once | Status: AC

## 2020-10-31 MED ORDER — POTASSIUM CHLORIDE CRYS ER 20 MEQ PO TBCR
20.0000 meq | EXTENDED_RELEASE_TABLET | ORAL | Status: AC
Start: 2020-10-31 — End: 2020-11-01
  Administered 2020-10-31 – 2020-11-01 (×3): 20 meq via ORAL
  Filled 2020-10-31 (×3): qty 1

## 2020-10-31 MED ORDER — ASPIRIN 325 MG PO TBEC: 325.0000 mg | DELAYED_RELEASE_TABLET | Freq: Every day | ORAL | 0 refills | Status: DC

## 2020-10-31 MED ORDER — CHLORHEXIDINE GLUCONATE CLOTH 2 % EX PADS
6.0000 | MEDICATED_PAD | Freq: Every day | CUTANEOUS | Status: DC
  Administered 2020-10-31: 6 via TOPICAL

## 2020-10-31 MED ORDER — FUROSEMIDE 10 MG/ML IJ SOLN
40.0000 mg | Freq: Once | INTRAMUSCULAR | Status: AC
  Administered 2020-10-31: 40 mg via INTRAVENOUS
  Filled 2020-10-31: qty 4

## 2020-10-31 MED ORDER — SODIUM CHLORIDE 0.9 % IV SOLN: 250.0000 mL | INTRAVENOUS | Status: DC | PRN

## 2020-10-31 MED ORDER — SODIUM CHLORIDE 0.9% FLUSH: 3.0000 mL | INTRAVENOUS | Status: DC | PRN

## 2020-10-31 NOTE — Progress Notes (Signed)
Removed pacing wires at 1300, pt remained on bed rest for 1 hour and BP remain stable.  Consuella Lose RN

## 2020-10-31 NOTE — Progress Notes (Signed)
      [address]       [place] [address]             [phone]                 3 Days Post-Op Procedure(s) (LRB): CORONARY ARTERY BYPASS GRAFTING (CABG), ON PUMP, TIMES FIVE, USING LEFT INTERNAL MAMMARY ARTERY, LEFT RADIAL ARTERY AND ENDOSCOPICALLY HARVESTED RIGHT GREATER SAPHENOUS VEIN (N/A) LEFT RADIAL ARTERY HARVEST (Left) TRANSESOPHAGEAL ECHOCARDIOGRAM (TEE) (N/A) ENDOVEIN HARVEST OF RIGHT GREATER SAPHENOUS VEIN (Right) APPLICATION OF CELL SAVER   Events: No events _______________________________________________________________ Vitals: BP (!) 141/72 (BP Location: Right Arm)   Pulse 81   Temp 98.9 F (37.2 C) (Oral)   Resp 19   Ht '5\' 8"'$  (1.727 m)   Wt 101.4 kg   SpO2 95%   BMI 33.99 kg/m   - Neuro: alert NAD  - Cardiovascular: sinus  Drips: none.      - Pulm: EWOB    ABG    Component Value Date/Time   PHART 7.352 10/28/2020 1859   PCO2ART 38.9 10/28/2020 1859   PO2ART 63 (L) 10/28/2020 1859   HCO3 21.5 10/28/2020 1859   TCO2 23 10/28/2020 1859   ACIDBASEDEF 4.0 (H) 10/28/2020 1859   O2SAT 90.0 10/28/2020 1859    - Abd: soft - Extremity: warm  .Intake/Output      08/12 0701 08/13 0700 08/13 0701 08/14 0700   P.O. 900    I.V. (mL/kg) 33.4 (0.3)    IV Piggyback 100    Total Intake(mL/kg) 1033.4 (10.2)    Urine (mL/kg/hr) 765 (0.3)    Emesis/NG output 0    Chest Tube     Total Output 765    Net +268.4         Emesis Occurrence 1 x       _______________________________________________________________ Labs: CBC Latest Ref Rng & Units 10/31/2020 10/30/2020 10/29/2020  WBC 4.0 - 10.5 K/uL 9.3 10.4 10.9(H)  Hemoglobin 13.0 - 17.0 g/dL 10.2(L) 9.7(L) 10.2(L)  Hematocrit 39.0 - 52.0 % 30.5(L) 30.6(L) 31.6(L)  Platelets 150 - 400 K/uL 131(L) 142(L) 146(L)   CMP Latest Ref Rng & Units 10/31/2020 10/30/2020 10/29/2020  Glucose 70 - 99 mg/dL 94 102(H) 113(H)  BUN 8 - 23 mg/dL 27(H) 24(H) 21  Creatinine 0.61 - 1.24 mg/dL 1.22  1.65(H) 1.76(H)  Sodium 135 - 145 mmol/L 132(L) 135 135  Potassium 3.5 - 5.1 mmol/L 3.9 3.8 4.3  Chloride 98 - 111 mmol/L 101 100 102  CO2 22 - 32 mmol/L '24 27 25  '$ Calcium 8.9 - 10.3 mg/dL 8.5(L) 8.3(L) 8.0(L)  Total Protein 6.5 - 8.1 g/dL - - -  Total Bilirubin 0.3 - 1.2 mg/dL - - -  Alkaline Phos 38 - 126 U/L - - -  AST 15 - 41 U/L - - -  ALT 0 - 44 U/L - - -    CXR: -  _______________________________________________________________  Assessment and Plan: POD 3 s/p CABG  Neuro: pain controlled CV: on A/S/BB.  Will remove pacing wires Pulm: pulm toilet Renal: gentle diuresis today GI: on diet Heme: stable ID: [email or web address] Endo: SSI Dispo: floor   Miguel Fletcher 10/31/2020 10:47 AM

## 2020-10-31 NOTE — Progress Notes (Signed)
Patient has already walked with nursing staff without difficulty, awaiting transfer out of [facility].  Education completed re: signs of infection, sternal precautions, incision care, daily weights, when to call the doctor, activity restrictions, exercise progression, and referred to phase II @ [facility].  Patient has no insurance, not sure if he will be able to participate. [id number]

## 2020-11-01 LAB — GLUCOSE, CAPILLARY
Glucose-Capillary: 83 mg/dL (ref 70–99)
Glucose-Capillary: 85 mg/dL (ref 70–99)

## 2020-11-01 MED ORDER — HYDRALAZINE HCL 20 MG/ML IJ SOLN
10.0000 mg | Freq: Once | INTRAMUSCULAR | Status: AC
  Administered 2020-11-01: 10 mg via INTRAVENOUS

## 2020-11-01 MED ORDER — HYDRALAZINE HCL 50 MG PO TABS
50.0000 mg | ORAL_TABLET | Freq: Three times a day (TID) | ORAL | Status: DC
  Administered 2020-11-01 – 2020-11-02 (×3): 50 mg via ORAL
  Filled 2020-11-01 (×3): qty 1

## 2020-11-01 MED ORDER — AMLODIPINE BESYLATE 10 MG PO TABS
10.0000 mg | ORAL_TABLET | Freq: Every day | ORAL | Status: DC
  Administered 2020-11-01 – 2020-11-02 (×2): 10 mg via ORAL
  Filled 2020-11-01 (×2): qty 1

## 2020-11-01 MED ORDER — HYDRALAZINE HCL 20 MG/ML IJ SOLN
INTRAMUSCULAR | Status: AC
  Filled 2020-11-01: qty 1

## 2020-11-01 MED ORDER — ALPRAZOLAM 0.5 MG PO TABS
0.5000 mg | ORAL_TABLET | Freq: Two times a day (BID) | ORAL | Status: DC | PRN
  Administered 2020-11-01 – 2020-11-02 (×3): 0.5 mg via ORAL
  Filled 2020-11-01 (×3): qty 1

## 2020-11-01 MED ORDER — LISINOPRIL 40 MG PO TABS
40.0000 mg | ORAL_TABLET | Freq: Every day | ORAL | Status: DC
  Administered 2020-11-01 – 2020-11-02 (×2): 40 mg via ORAL
  Filled 2020-11-01: qty 2
  Filled 2020-11-01: qty 1

## 2020-11-01 NOTE — Progress Notes (Signed)
4 Days Post-Op Procedure(s) (LRB): CORONARY ARTERY BYPASS GRAFTING (CABG), ON PUMP, TIMES FIVE, USING LEFT INTERNAL MAMMARY ARTERY, LEFT RADIAL ARTERY AND ENDOSCOPICALLY HARVESTED RIGHT GREATER SAPHENOUS VEIN (N/A) LEFT RADIAL ARTERY HARVEST (Left) TRANSESOPHAGEAL ECHOCARDIOGRAM (TEE) (N/A) ENDOVEIN HARVEST OF RIGHT GREATER SAPHENOUS VEIN (Right) APPLICATION OF CELL SAVER Subjective: Anxious this am. Wants to get outside and get home.  No BM yet. Received MOM this am.  Objective: Vital signs in last 24 hours: Temp:  [97.9 F (36.6 C)-98.3 F (36.8 C)] 98.2 F (36.8 C) (08/14 0800) Pulse Rate:  [66-87] 81 (08/14 1000) Cardiac Rhythm: Normal sinus rhythm (08/14 0800) Resp:  [12-26] 18 (08/14 1000) BP: (110-209)/(58-122) 203/91 (08/14 1000) SpO2:  [91 %-98 %] 98 % (08/14 1000) Weight:  [98.7 kg] 98.7 kg (08/14 0312)  Hemodynamic parameters for last 24 hours:    Intake/Output from previous day: 08/13 0701 - 08/14 0700 In: 963 [P.O.:960; I.V.:3] Out: 3900 [Urine:3900] Intake/Output this shift: Total I/O In: -  Out: 1350 [Urine:1350]  General appearance: alert and cooperative Neurologic: intact Heart: regular rate and rhythm Lungs: clear to auscultation bilaterally Extremities: no edema Wound: incision ok  Lab Results: Recent Labs    10/31/20 0326 10/31/20 1937  WBC 9.3 7.8  HGB 10.2* 9.9*  HCT 30.5* 29.6*  PLT 131* 154   BMET:  Recent Labs    10/31/20 0326 10/31/20 1937  NA 132* 134*  K 3.9 3.7  CL 101 102  CO2 24 25  GLUCOSE 94 93  BUN 27* 25*  CREATININE 1.22 1.07  CALCIUM 8.5* 8.2*    PT/INR: No results for input(s): LABPROT, INR in the last 72 hours. ABG    Component Value Date/Time   PHART 7.352 10/28/2020 1859   HCO3 21.5 10/28/2020 1859   TCO2 23 10/28/2020 1859   ACIDBASEDEF 4.0 (H) 10/28/2020 1859   O2SAT 90.0 10/28/2020 1859   CBG (last 3)  Recent Labs    10/31/20 1640 10/31/20 2204 11/01/20 0534  GLUCAP 85 82 83     Assessment/Plan: S/P Procedure(s) (LRB): CORONARY ARTERY BYPASS GRAFTING (CABG), ON PUMP, TIMES FIVE, USING LEFT INTERNAL MAMMARY ARTERY, LEFT RADIAL ARTERY AND ENDOSCOPICALLY HARVESTED RIGHT GREATER SAPHENOUS VEIN (N/A) LEFT RADIAL ARTERY HARVEST (Left) TRANSESOPHAGEAL ECHOCARDIOGRAM (TEE) (N/A) ENDOVEIN HARVEST OF RIGHT GREATER SAPHENOUS VEIN (Right) APPLICATION OF CELL SAVER  POD 4 Hypertensive. Resume previous lisinopril and increase hydralazine to 50 tid. Continue Coreg 3.125 and may increase that later if needed. He was also on Norvasc 10 at home.  Volume excess: wt is only about 3 lbs over preop and diuresing well.   Glucose under good control and preop Hgb A1c nl on no meds. Will stop SSI.  Continue mobilization  Still waiting on 4E bed. Plan home tomorrow if he has BM.   LOS: 4 days    Gaye Pollack 11/01/2020

## 2020-11-01 NOTE — Progress Notes (Signed)
Patient arrived from [facility].  A&O x 3.  Vital signs per protocol.  CCMD notified.  CHG bath. Oriented to room.

## 2020-11-02 ENCOUNTER — Inpatient Hospital Stay (HOSPITAL_COMMUNITY): Payer: Self-pay

## 2020-11-02 LAB — GLUCOSE, CAPILLARY: Glucose-Capillary: 98 mg/dL (ref 70–99)

## 2020-11-02 MED ORDER — ATORVASTATIN CALCIUM 80 MG PO TABS: 80.0000 mg | ORAL_TABLET | Freq: Every day | ORAL | 3 refills | Status: DC

## 2020-11-02 MED ORDER — OXYCODONE HCL 5 MG PO TABS: 5.0000 mg | ORAL_TABLET | ORAL | 0 refills | Status: DC | PRN

## 2020-11-02 MED ORDER — ISOSORBIDE MONONITRATE ER 60 MG PO TB24
60.0000 mg | ORAL_TABLET | Freq: Every day | ORAL | Status: DC
  Administered 2020-11-02: 60 mg via ORAL
  Filled 2020-11-02: qty 1

## 2020-11-02 NOTE — Progress Notes (Signed)
      [address]       [place] [address]             [phone]      5 Days Post-Op Procedure(s) (LRB): CORONARY ARTERY BYPASS GRAFTING (CABG), ON PUMP, TIMES FIVE, USING LEFT INTERNAL MAMMARY ARTERY, LEFT RADIAL ARTERY AND ENDOSCOPICALLY HARVESTED RIGHT GREATER SAPHENOUS VEIN (N/A) LEFT RADIAL ARTERY HARVEST (Left) TRANSESOPHAGEAL ECHOCARDIOGRAM (TEE) (N/A) ENDOVEIN HARVEST OF RIGHT GREATER SAPHENOUS VEIN (Right) APPLICATION OF CELL SAVER  Subjective:   Patient wants to go home.  He was wanting to leave last night.  He is ambulating.  + BM  Objective: Vital signs in last 24 hours: Temp:  [97.9 F (36.6 C)-98.8 F (37.1 C)] 98.8 F (37.1 C) (08/15 0530) Pulse Rate:  [80-98] 89 (08/15 0530) Cardiac Rhythm: Heart block;Bundle branch block (08/14 1905) Resp:  [17-30] 17 (08/15 0530) BP: (156-209)/(77-179) 170/90 (08/15 0530) SpO2:  [95 %-100 %] 97 % (08/15 0530) Weight:  [95.8 kg] 95.8 kg (08/15 0530)  Intake/Output from previous day: 08/14 0701 - 08/15 0700 In: 270 [P.O.:270] Out: 2150 [Urine:2150]  General appearance: alert, cooperative, and no distress Heart: regular rate and rhythm Lungs: clear to auscultation bilaterally Abdomen: soft, non-tender; bowel sounds normal; no masses,  no organomegaly Extremities: edema trace Wound: clean and dry  Lab Results: Recent Labs    10/31/20 0326 10/31/20 1937  WBC 9.3 7.8  HGB 10.2* 9.9*  HCT 30.5* 29.6*  PLT 131* 154   BMET:  Recent Labs    10/31/20 0326 10/31/20 1937  NA 132* 134*  K 3.9 3.7  CL 101 102  CO2 24 25  GLUCOSE 94 93  BUN 27* 25*  CREATININE 1.22 1.07  CALCIUM 8.5* 8.2*    PT/INR: No results for input(s): LABPROT, INR in the last 72 hours. ABG    Component Value Date/Time   PHART 7.352 10/28/2020 1859   HCO3 21.5 10/28/2020 1859   TCO2 23 10/28/2020 1859   ACIDBASEDEF 4.0 (H) 10/28/2020 1859   O2SAT 90.0 10/28/2020 1859   CBG (last 3)  Recent Labs     10/31/20 2204 11/01/20 0534 11/01/20 1153  GLUCAP 82 83 85    Assessment/Plan: S/P Procedure(s) (LRB): CORONARY ARTERY BYPASS GRAFTING (CABG), ON PUMP, TIMES FIVE, USING LEFT INTERNAL MAMMARY ARTERY, LEFT RADIAL ARTERY AND ENDOSCOPICALLY HARVESTED RIGHT GREATER SAPHENOUS VEIN (N/A) LEFT RADIAL ARTERY HARVEST (Left) TRANSESOPHAGEAL ECHOCARDIOGRAM (TEE) (N/A) ENDOVEIN HARVEST OF RIGHT GREATER SAPHENOUS VEIN (Right) APPLICATION OF CELL SAVER  CV- NSR, + HTN- will continue Coreg, will continue Hydralazine, Norvasc, Lisinopril, and Imdur Pulm- no acute issues, not on oxygen, continue IS Renal- creatinine has been stable, weight is below baseline, no indication for Lasix GI- constipation resolved Dispo- patient stable, resume all home antihypertensive agents, will d/c home today   LOS: 5 days    Ellwood Handler, PA-C 11/02/2020

## 2020-11-02 NOTE — Progress Notes (Signed)
Removed ct sutures and applied 1/2 " steri strips. Pt tolerated well. Pt/family given discharge instructions, medication lists, follow up appointments, and when to call the doctor.  Pt/family verbalizes understanding. Payton Emerald, RN

## 2020-11-02 NOTE — Progress Notes (Signed)
Wife called concerned about whether husband was discharging or not. I explained he was sleeping with his uncle at bedside. She wanted uncle to bring him home if he was ready but did not want him awakened if he was sleeping.  I explained that he still had discharge order and I would check with him later to see if he felt better about going home. I explained that if she wanted to speak to doctor about her concerns I would let them know, otherwise if he awakens and is ready to go he will be discharged. Wife concerned that she feels he is not ready to come home. Pt resting with call bell within reach.  Will continue to monitor.

## 2020-11-02 NOTE — Progress Notes (Signed)
The patient wanted to talk to the on call surgeon tonight because he believed that he would be able to go home tonight.  The nurse paged the on call cardiothoracic surgeon, who stated that the patient is not to discharge tonight and it will be discussed the next day when he will be able to go home.  The patient was informed of the surgeon's orders.  Patient is currently resting in bed with call bell in reach.  Will continue to monitor.  Lupita Dawn, RN

## 2020-11-02 NOTE — Progress Notes (Signed)
Mobility Specialist: Progress Note   11/02/20 1117  Mobility  Activity Ambulated in hall  Level of Assistance Minimal assist, patient does 75% or more  Assistive Device Front wheel walker  Distance Ambulated (ft) 470 ft  Mobility Ambulated with assistance in hallway  Mobility Response Tolerated well  Mobility performed by Mobility specialist  $Mobility charge 1 Mobility   During Mobility: 87 HR Post-Mobility: 96 HR, 98% SpO2  Pt required minA to sit EOB from supine and was standby assist to stand from EOB. Pt asx throughout ambulation. Pt back to bed after walk with call bell at his side and family member present in the room.   [facility] Hyman Crossan Mobility Specialist Mobility Specialist Phone: [phone]

## 2020-11-02 NOTE — Progress Notes (Signed)
Pt called about SOB and "feeling bad" after seeing PA. Pt asked about pain and had not pain when returned to return stated 8/10 pain in chest.  Pt wife came and concerned about pt returning when he felt bad. Explained that doctor was ordering a chest xray. I had given him medication for pain and anxiety as well as regular daily medications. Wife left stating she would call back later. Pt resting with call bell within reach.  Will continue to monitor.

## 2020-11-03 ENCOUNTER — Other Ambulatory Visit: Payer: Self-pay | Admitting: *Deleted

## 2020-11-03 DIAGNOSIS — I214 Non-ST elevation (NSTEMI) myocardial infarction: Secondary | ICD-10-CM

## 2020-11-03 DIAGNOSIS — Z951 Presence of aortocoronary bypass graft: Secondary | ICD-10-CM

## 2020-11-05 MED FILL — Sodium Bicarbonate IV Soln 8.4%: INTRAVENOUS | Qty: 50 | Status: AC

## 2020-11-05 MED FILL — Sodium Chloride IV Soln 0.9%: INTRAVENOUS | Qty: 2000 | Status: AC

## 2020-11-05 MED FILL — Electrolyte-R (PH 7.4) Solution: INTRAVENOUS | Qty: 3000 | Status: AC

## 2020-11-05 MED FILL — Lidocaine HCl Local Preservative Free (PF) Inj 2%: INTRAMUSCULAR | Qty: 5 | Status: AC

## 2020-11-05 MED FILL — Mannitol IV Soln 20%: INTRAVENOUS | Qty: 500 | Status: AC

## 2020-11-05 MED FILL — Heparin Sodium (Porcine) Inj 1000 Unit/ML: INTRAMUSCULAR | Qty: 30 | Status: AC

## 2020-11-05 MED FILL — Potassium Chloride Inj 2 mEq/ML: INTRAVENOUS | Qty: 40 | Status: AC

## 2020-11-05 MED FILL — Magnesium Sulfate Inj 50%: INTRAMUSCULAR | Qty: 10 | Status: AC

## 2020-11-09 ENCOUNTER — Encounter: Payer: Self-pay | Admitting: Nurse Practitioner

## 2020-11-09 ENCOUNTER — Other Ambulatory Visit: Payer: Self-pay

## 2020-11-09 ENCOUNTER — Ambulatory Visit (INDEPENDENT_AMBULATORY_CARE_PROVIDER_SITE_OTHER): Payer: Self-pay | Admitting: Nurse Practitioner

## 2020-11-09 VITALS — BP 128/60 | HR 58 | Ht 68.5 in | Wt 207.4 lb

## 2020-11-09 DIAGNOSIS — I2510 Atherosclerotic heart disease of native coronary artery without angina pectoris: Secondary | ICD-10-CM

## 2020-11-09 DIAGNOSIS — I517 Cardiomegaly: Secondary | ICD-10-CM

## 2020-11-09 DIAGNOSIS — Z951 Presence of aortocoronary bypass graft: Secondary | ICD-10-CM

## 2020-11-09 DIAGNOSIS — I5189 Other ill-defined heart diseases: Secondary | ICD-10-CM

## 2020-11-09 DIAGNOSIS — I10 Essential (primary) hypertension: Secondary | ICD-10-CM

## 2020-11-09 DIAGNOSIS — E785 Hyperlipidemia, unspecified: Secondary | ICD-10-CM

## 2020-11-09 MED ORDER — CARVEDILOL 3.125 MG PO TABS: 3.1250 mg | ORAL_TABLET | Freq: Two times a day (BID) | ORAL | 1 refills | Status: DC

## 2020-11-09 NOTE — Progress Notes (Signed)
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 08/2020 MRI [facility]): Well-circumscribed T1 hypointense, T2 hyperintense enhancing mass within the right axilla measuring 3.1 x 2.6 x 3.0 cm.  Mass extends along the course of the axillary neurovascular structures.  No evidence for invasion into the adjacent musculature.  No enlarged axillary lymph nodes.   Bell's palsy    Biliary colic    CAD (coronary artery disease)    a. 09/2020 NSTEMI/Cath: LM 60-70d, LAD 70p, D2 60, RI 70p, LCX ild diff dzs, RCA 40-44m RPDA 50, RPAV 50; b. 10/2020 CABG x 5 (LIMA->LAD, L radial->RI, VG->D1, VG->RPDA->RPL.   Diastolic dysfunction    a. 09/2020 Echo: EF 60-65%, no rwma, sev LVH. Gr1 DD. Nl RV size/fxn. Mod dil LA.   Hemorrhoids    Hypercholesterolemia    Hypertension    [place]) [place]    Tobacco use    Past Surgical History:  Procedure Laterality Date   CORONARY ARTERY BYPASS GRAFT N/A 10/28/2020   Procedure: CORONARY ARTERY BYPASS GRAFTING (CABG), ON PUMP, TIMES FIVE, USING LEFT INTERNAL MAMMARY ARTERY, LEFT RADIAL ARTERY AND ENDOSCOPICALLY HARVESTED RIGHT GREATER SAPHENOUS VEIN;  Surgeon: HMelrose Nakayama MD;  [place]  Service: Open Heart Surgery;  Laterality: N/A;   ENDOVEIN HARVEST OF GREATER SAPHENOUS VEIN Right 10/28/2020   Procedure: ENDOVEIN HARVEST OF RIGHT GREATER SAPHENOUS VEIN;  Surgeon: HMelrose Nakayama MD;  [place]  Service: Open Heart Surgery;  Laterality:  Right;   INTRAVASCULAR ULTRASOUND/IVUS N/A 10/06/2020   Procedure: Intravascular Ultrasound/IVUS;  Surgeon: [name] MD;  [place];  Service: Cardiovascular;  Laterality: N/A;   LEFT HEART CATH AND CORONARY ANGIOGRAPHY N/A 10/06/2020   Procedure: LEFT HEART CATH AND CORONARY ANGIOGRAPHY;  Surgeon: [name] MD;  [place] [facility];  Service: Cardiovascular;  Laterality: N/A;   RADIAL ARTERY HARVEST Left 10/28/2020   Procedure: LEFT RADIAL ARTERY HARVEST;  Surgeon: HMelrose Nakayama MD;  [place]  Service: Open Heart Surgery;  Laterality: Left;   TEE WITHOUT CARDIOVERSION N/A 10/28/2020   Procedure: TRANSESOPHAGEAL ECHOCARDIOGRAM (TEE);  Surgeon: HMelrose Nakayama MD;  [place]  Service: Open Heart Surgery;  Laterality: N/A;   VARICOSE VEIN SURGERY Left     Allergies  No Known Allergies  History of Present Illness    65year old male with the above past medical history including tobacco abuse, hypertension, hyperlipidemia, stroke, Bell's palsy, 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  He has a long history of poorly controlled hypertension as well as noncompliance with his medications.  [facility] and elevated troponin.  Echo showed an EF of 60 to 65% with grade 1 diastolic dysfunction.  Symptoms were felt to be secondary to nerve sheath tumor however, in the setting of [place] troponin, admission was advised.  He left AMA.  He returned on July 18 with fevers, chills, malaise, and chest pain.  He was found to be COVID-positive.  He was hypertensive.  Troponin peaked at 144.  He was admitted and treated for COVID.  He underwent diagnostic catheterization revealing severe multivessel coronary disease as outlined above in the past medical history.  It was felt that he would require outpatient CT surgical evaluation following recovery from [place].  At  office follow-up on July 26, he was hypertensive and was unclear as to what medications he was taking.  He was encouraged to resume his carvedilol and statin therapy.  He was referred to CT surgery.  Upon initial evaluation, he was markedly hypertensive and required ER evaluation for systolics in the [id number] range.  Following subsequent stabilization, arrangements were made for elective admission and CABG x5, which was successfully performed on August 10.  Postoperative course was relatively uneventful and he was able to be discharged on August 15.  Since discharge, patient says he has been compliant with all of his medications as prescribed and has been feeling exceptionally well.  He has not had any angina or chest wall pain and notes that all of his surgical incisions have been healing well.  He does check his blood pressure at home and this typically runs in the 130-140 range.  He has not smoked since his index hospitalization in July.  He denies palpitations, PND, orthopnea, dizziness, syncope, edema, dyspnea, or early satiety.  He plans to stay out of work until January and is looking forward to improving his fitness through cardiac rehabilitation once cleared by surgery.  Home Medications    Current Outpatient Medications  Medication Sig Dispense Refill   amLODipine (NORVASC) 10 MG tablet Take 1 tablet (10 mg total) by mouth daily. 90 tablet 3   aspirin EC 325 MG EC tablet Take 1 tablet (325 mg total) by mouth daily. 30 tablet 0   atorvastatin (LIPITOR) 80 MG tablet Take 1 tablet (80 mg total) by mouth daily at 8 pm. 30 tablet 3   carvedilol (COREG) 3.125 MG tablet Take 1 tablet (3.125 mg total) by mouth 2 (two) times daily with a meal. 60 tablet 1   hydrALAZINE (APRESOLINE) 100 MG tablet Take 0.5 tablets (50 mg total) by mouth 3 (three) times daily. 90 tablet 1   isosorbide mononitrate (IMDUR) 60 MG 24 hr tablet Take 1 tablet (60 mg total) by mouth daily. 90 tablet 3   lisinopril (ZESTRIL) 40 MG  tablet Take 1 tablet (40 mg total) by mouth daily. 90 tablet 3   oxyCODONE (OXY IR/ROXICODONE) 5 MG immediate release tablet Take 1-2 tablets (5-10 mg total) by mouth every 4 (four) hours as needed for severe pain. 30 tablet 0   No current facility-administered medications for this visit.     Review of Systems    Doing exceptionally well status post CABG.  He denies chest pain, palpitations, dyspnea, pnd, orthopnea, n, v, dizziness, syncope, edema, weight gain, or early satiety.  All other systems reviewed and are otherwise negative except as noted above.  Physical Exam    VS:  BP 128/60 (BP Location: Right Arm, Patient Position: Sitting, Cuff Size: Normal)   Pulse (!) 58   Ht 5' 8.5" (1.74 m)   Wt 207 lb 6 oz (94.1 kg)   SpO2 98%   BMI 31.07 kg/m  , BMI Body mass index is 31.07 kg/m.     GEN: Well nourished, well developed, in no acute distress. HEENT: normal. Neck: Supple, no JVD, carotid bruits, or masses. Cardiac: RRR, 2/6 systolic murmur heard  throughout, no rubs, or gallops. No clubbing, cyanosis, trace bilateral lower extremity edema.  Radials 2+/PT 1+ and equal bilaterally.  Sternal incision and right medial leg incision are healing well without bleeding, erythema, or drainage. Respiratory:  Respirations regular and unlabored, clear to auscultation bilaterally. GI: Soft, nontender, nondistended, BS + x 4.  Upper abdominal incisions are healing well without bleeding, erythema, or drainage. MS: no deformity or atrophy. Skin: warm and dry, no rash. Neuro:  Strength and sensation are intact. Psych: Normal affect.  Accessory Clinical Findings    ECG personally reviewed by me today -sinus bradycardia, 58, left axis deviation, LVH with repolarization abnormalities- no acute changes.  Lab Results  Component Value Date   WBC 7.8 10/31/2020   HGB 9.9 (L) 10/31/2020   HCT 29.6 (L) 10/31/2020   MCV 95.5 10/31/2020   PLT 154 10/31/2020   Lab Results  Component Value Date    CREATININE 1.07 10/31/2020   BUN 25 (H) 10/31/2020   NA 134 (L) 10/31/2020   K 3.7 10/31/2020   CL 102 10/31/2020   CO2 25 10/31/2020   Lab Results  Component Value Date   ALT 18 10/26/2020   AST 18 10/26/2020   ALKPHOS 61 10/26/2020   BILITOT 1.0 10/26/2020   Lab Results  Component Value Date   CHOL 118 10/06/2020   HDL 31 (L) 10/06/2020   LDLCALC 78 10/06/2020   TRIG 45 10/06/2020   CHOLHDL 3.8 10/06/2020    Lab Results  Component Value Date   HGBA1C 5.3 10/26/2020    Assessment & Plan    1.  Coronary artery disease: Status post non-STEMI in July with finding of severe multivessel coronary artery disease on catheterization.  He was subsequently referred to CT surgery and underwent CABG x5 earlier this month.  Postoperative course was relatively uneventful and he was discharged home August 15.  He has been feeling exceptionally well without any chest pain or dyspnea.  Surgical sites are healing well without evidence of drainage or erythema.  He notes compliance with all of his medications and his blood pressure is stable today at 128/60.  He does plan to enroll in cardiac rehab once cleared.  He remains on aspirin, statin, beta-blocker, ACE inhibitor, and nitrate therapy.  He quit smoking in July.  Given mild anemia at discharge, I will follow-up a CBC today.  2.  Essential hypertension: This is historically poorly controlled but he notes good compliance and tolerance of his medications and blood pressure is 128/60 today.  I congratulated him on this and encouraged him to remain compliant.  Continue amlodipine, carvedilol, hydralazine, lisinopril, and isosorbide mononitrate therapy.  3.  Hyperlipidemia: LDL of 78 back in July.  He has been on high potency statin therapy since then.  Follow-up lipids (with direct LDL) and a complete metabolic panel today.  4.  Tobacco abuse: He has not smoked since his index admission in July.  I congratulated him on this and encouraged him to  remain off of cigarettes.  5.  Right-sided weakness with nerve sheath tumor: Followed by neurosurgery at [facility].  6.  Severe LVH/diastolic dysfunction: In the setting of poorly controlled hypertension previously.  As above, blood pressure stable today.  He has trace lower extremity edema on exam.  He believes this is because he has been sitting around a lot.  He is not interested in the low-dose of Lasix.  His weight is actually down 4 pounds since his last visit.  7.  Disposition: Follow-up  CBC, complete metabolic panel, and lipids with direct LDL today.  He has follow-up with surgery and just under 1 month and we will plan to see him back in approximately 6 to 8 weeks.   Murray Hodgkins, NP 11/09/2020, 2:09 PM

## 2020-11-09 NOTE — Patient Instructions (Addendum)
Medication Instructions:  No changes at this time.   *If you need a refill on your cardiac medications before your next appointment, please call your pharmacy*   Lab Work: CBC, CMET, Lipid, and Direct LDL today  If you have labs (blood work) drawn today and your tests are completely normal, you will receive your results only by: [place] (if you have MyChart) OR A paper copy in the mail If you have any lab test that is abnormal or we need to change your treatment, we will call you to review the results.   Testing/Procedures: None   Follow-Up: At [facility], you and your health needs are our priority.  As part of our continuing mission to provide you with exceptional heart care, we have created designated Provider Care Teams.  These Care Teams include your primary Cardiologist (physician) and Advanced Practice Providers (APPs -  Physician Assistants and Nurse Practitioners) who all work together to provide you with the care you need, when you need it.  We recommend signing up for the patient portal called "MyChart".  Sign up information is provided on this After Visit Summary.  MyChart is used to connect with patients for Virtual Visits (Telemedicine).  Patients are able to view lab/test results, encounter notes, upcoming appointments, etc.  Non-urgent messages can be sent to your provider as well.   To learn more about what you can do with MyChart, go to [email or web address].    Your next appointment:   2 month(s)  The format for your next appointment:   In Person  Provider:   Ida Rogue, MD or Murray Hodgkins, NP

## 2020-11-10 ENCOUNTER — Telehealth: Payer: Self-pay | Admitting: *Deleted

## 2020-11-10 LAB — LDL CHOLESTEROL, DIRECT: LDL Direct: 30 mg/dL (ref 0–99)

## 2020-11-10 LAB — COMPREHENSIVE METABOLIC PANEL
ALT: 47 IU/L — ABNORMAL HIGH (ref 0–44)
AST: 34 IU/L (ref 0–40)
Albumin/Globulin Ratio: 1.1 — ABNORMAL LOW (ref 1.2–2.2)
Albumin: 3.5 g/dL — ABNORMAL LOW (ref 3.8–4.8)
Alkaline Phosphatase: 121 IU/L (ref 44–121)
BUN/Creatinine Ratio: 10 (ref 10–24)
BUN: 10 mg/dL (ref 8–27)
Bilirubin Total: 0.6 mg/dL (ref 0.0–1.2)
CO2: 24 mmol/L (ref 20–29)
Calcium: 9.3 mg/dL (ref 8.6–10.2)
Chloride: 104 mmol/L (ref 96–106)
Creatinine, Ser: 0.99 mg/dL (ref 0.76–1.27)
Globulin, Total: 3.2 g/dL (ref 1.5–4.5)
Glucose: 95 mg/dL (ref 65–99)
Potassium: 4.2 mmol/L (ref 3.5–5.2)
Sodium: 140 mmol/L (ref 134–144)
Total Protein: 6.7 g/dL (ref 6.0–8.5)
eGFR: 85 mL/min/{1.73_m2} (ref >59)

## 2020-11-10 LAB — CBC
Hematocrit: 32.7 % — ABNORMAL LOW (ref 37.5–51.0)
Hemoglobin: 10.9 g/dL — ABNORMAL LOW (ref 13.0–17.7)
MCH: 30.2 pg (ref 26.6–33.0)
MCHC: 33.3 g/dL (ref 31.5–35.7)
MCV: 91 fL (ref 79–97)
Platelets: 492 10*3/uL — ABNORMAL HIGH (ref 150–450)
RBC: 3.61 x10E6/uL — ABNORMAL LOW (ref 4.14–5.80)
RDW: 12.7 % (ref 11.6–15.4)
WBC: 7.6 10*3/uL (ref 3.4–10.8)

## 2020-11-10 LAB — LIPID PANEL
Chol/HDL Ratio: 3.4 ratio (ref 0.0–5.0)
Cholesterol, Total: 72 mg/dL — ABNORMAL LOW (ref 100–199)
HDL: 21 mg/dL — ABNORMAL LOW (ref >39)
LDL Chol Calc (NIH): 33 mg/dL (ref 0–99)
Triglycerides: 87 mg/dL (ref 0–149)
VLDL Cholesterol Cal: 18 mg/dL (ref 5–40)

## 2020-11-10 NOTE — Telephone Encounter (Signed)
-----   Message from Theora Gianotti, NP sent at 11/10/2020  9:25 AM EDT ----- Electrolytes, kidney function normal.  Blood counts are stable since discharge.  Mild abnormality to his ALT, likely non-specific, but we'll f/u labs at some point in the future.LDL cholesterol at goal @ 30.  Cont current lipitor dose.

## 2020-11-10 NOTE — Telephone Encounter (Signed)
Left voicemail message on number listed.   Reviewed results with patients wife per release form. She verbalized understanding and will pass this information on to the patient. She had no further questions at this time.

## 2020-11-11 ENCOUNTER — Other Ambulatory Visit: Payer: Self-pay

## 2020-11-11 ENCOUNTER — Telehealth: Payer: Self-pay | Admitting: *Deleted

## 2020-11-11 ENCOUNTER — Emergency Department: Payer: Self-pay

## 2020-11-11 ENCOUNTER — Telehealth: Payer: Self-pay | Admitting: Nurse Practitioner

## 2020-11-11 ENCOUNTER — Inpatient Hospital Stay
Admission: EM | Admit: 2020-11-11 | Discharge: 2020-11-12 | DRG: 291 | Disposition: A | Discharge status: Home / Self Care | Payer: Self-pay | Attending: Internal Medicine | Admitting: Internal Medicine

## 2020-11-11 DIAGNOSIS — I5033 Acute on chronic diastolic (congestive) heart failure: Secondary | ICD-10-CM | POA: Diagnosis present

## 2020-11-11 DIAGNOSIS — Z8616 Personal history of COVID-19: Secondary | ICD-10-CM

## 2020-11-11 DIAGNOSIS — I509 Heart failure, unspecified: Secondary | ICD-10-CM

## 2020-11-11 DIAGNOSIS — F17210 Nicotine dependence, cigarettes, uncomplicated: Secondary | ICD-10-CM | POA: Diagnosis present

## 2020-11-11 DIAGNOSIS — J90 Pleural effusion, not elsewhere classified: Secondary | ICD-10-CM | POA: Diagnosis present

## 2020-11-11 DIAGNOSIS — Z20822 Contact with and (suspected) exposure to covid-19: Secondary | ICD-10-CM | POA: Diagnosis present

## 2020-11-11 DIAGNOSIS — Z79899 Other long term (current) drug therapy: Secondary | ICD-10-CM

## 2020-11-11 DIAGNOSIS — I10 Essential (primary) hypertension: Secondary | ICD-10-CM | POA: Diagnosis present

## 2020-11-11 DIAGNOSIS — E785 Hyperlipidemia, unspecified: Secondary | ICD-10-CM | POA: Diagnosis present

## 2020-11-11 DIAGNOSIS — I110 Hypertensive heart disease with heart failure: Principal | ICD-10-CM | POA: Diagnosis present

## 2020-11-11 DIAGNOSIS — I252 Old myocardial infarction: Secondary | ICD-10-CM

## 2020-11-11 DIAGNOSIS — Z9114 Patient's other noncompliance with medication regimen: Secondary | ICD-10-CM

## 2020-11-11 DIAGNOSIS — Z8249 Family history of ischemic heart disease and other diseases of the circulatory system: Secondary | ICD-10-CM

## 2020-11-11 DIAGNOSIS — R0602 Shortness of breath: Secondary | ICD-10-CM

## 2020-11-11 DIAGNOSIS — Z8673 Personal history of transient ischemic attack (TIA), and cerebral infarction without residual deficits: Secondary | ICD-10-CM

## 2020-11-11 DIAGNOSIS — Z720 Tobacco use: Secondary | ICD-10-CM | POA: Diagnosis present

## 2020-11-11 DIAGNOSIS — J918 Pleural effusion in other conditions classified elsewhere: Secondary | ICD-10-CM | POA: Diagnosis present

## 2020-11-11 DIAGNOSIS — E7800 Pure hypercholesterolemia, unspecified: Secondary | ICD-10-CM | POA: Diagnosis present

## 2020-11-11 DIAGNOSIS — Z7982 Long term (current) use of aspirin: Secondary | ICD-10-CM

## 2020-11-11 DIAGNOSIS — I2510 Atherosclerotic heart disease of native coronary artery without angina pectoris: Secondary | ICD-10-CM | POA: Diagnosis present

## 2020-11-11 DIAGNOSIS — Z951 Presence of aortocoronary bypass graft: Secondary | ICD-10-CM

## 2020-11-11 LAB — BASIC METABOLIC PANEL
Anion gap: 8 (ref 5–15)
BUN: 11 mg/dL (ref 8–23)
CO2: 24 mmol/L (ref 22–32)
Calcium: 8.7 mg/dL — ABNORMAL LOW (ref 8.9–10.3)
Chloride: 108 mmol/L (ref 98–111)
Creatinine, Ser: 0.9 mg/dL (ref 0.61–1.24)
GFR, Estimated: >60 mL/min (ref >60)
Glucose, Bld: 94 mg/dL (ref 70–99)
Potassium: 3.8 mmol/L (ref 3.5–5.1)
Sodium: 140 mmol/L (ref 135–145)

## 2020-11-11 LAB — CBC
HCT: 32 % — ABNORMAL LOW (ref 39.0–52.0)
Hemoglobin: 10.6 g/dL — ABNORMAL LOW (ref 13.0–17.0)
MCH: 30.4 pg (ref 26.0–34.0)
MCHC: 33.1 g/dL (ref 30.0–36.0)
MCV: 91.7 fL (ref 80.0–100.0)
Platelets: 498 10*3/uL — ABNORMAL HIGH (ref 150–400)
RBC: 3.49 MIL/uL — ABNORMAL LOW (ref 4.22–5.81)
RDW: 13.3 % (ref 11.5–15.5)
WBC: 6.8 10*3/uL (ref 4.0–10.5)
nRBC: 0 % (ref 0.0–0.2)

## 2020-11-11 LAB — BRAIN NATRIURETIC PEPTIDE: B Natriuretic Peptide: 393.4 pg/mL — ABNORMAL HIGH (ref 0.0–100.0)

## 2020-11-11 LAB — TROPONIN I (HIGH SENSITIVITY)
Troponin I (High Sensitivity): 51 ng/L — ABNORMAL HIGH (ref <18)
Troponin I (High Sensitivity): 56 ng/L — ABNORMAL HIGH (ref <18)
Troponin I (High Sensitivity): 59 ng/L — ABNORMAL HIGH (ref <18)

## 2020-11-11 MED ORDER — ONDANSETRON HCL 4 MG PO TABS: 4.0000 mg | ORAL_TABLET | Freq: Four times a day (QID) | ORAL | Status: DC | PRN

## 2020-11-11 MED ORDER — ASPIRIN EC 325 MG PO TBEC
325.0000 mg | DELAYED_RELEASE_TABLET | Freq: Every day | ORAL | Status: DC
  Administered 2020-11-12: 325 mg via ORAL
  Filled 2020-11-11: qty 1

## 2020-11-11 MED ORDER — HEPARIN SODIUM (PORCINE) 5000 UNIT/ML IJ SOLN
5000.0000 [IU] | Freq: Three times a day (TID) | INTRAMUSCULAR | Status: AC
  Administered 2020-11-11 (×2): 5000 [IU] via SUBCUTANEOUS
  Filled 2020-11-11 (×2): qty 1

## 2020-11-11 MED ORDER — ISOSORBIDE MONONITRATE ER 30 MG PO TB24
60.0000 mg | ORAL_TABLET | Freq: Every day | ORAL | Status: DC
  Administered 2020-11-12: 60 mg via ORAL
  Filled 2020-11-11: qty 2

## 2020-11-11 MED ORDER — FUROSEMIDE 10 MG/ML IJ SOLN
40.0000 mg | Freq: Once | INTRAMUSCULAR | Status: AC
  Administered 2020-11-11: 40 mg via INTRAVENOUS
  Filled 2020-11-11: qty 4

## 2020-11-11 MED ORDER — FUROSEMIDE 10 MG/ML IJ SOLN
40.0000 mg | Freq: Every day | INTRAMUSCULAR | Status: DC
  Administered 2020-11-12: 40 mg via INTRAVENOUS
  Filled 2020-11-11: qty 4

## 2020-11-11 MED ORDER — ENOXAPARIN SODIUM 40 MG/0.4ML IJ SOSY: 40.0000 mg | PREFILLED_SYRINGE | INTRAMUSCULAR | Status: DC

## 2020-11-11 MED ORDER — ASPIRIN EC 81 MG PO TBEC: 81.0000 mg | DELAYED_RELEASE_TABLET | Freq: Every day | ORAL | Status: DC

## 2020-11-11 MED ORDER — LISINOPRIL 20 MG PO TABS
40.0000 mg | ORAL_TABLET | Freq: Every day | ORAL | Status: DC
  Administered 2020-11-12: 40 mg via ORAL
  Filled 2020-11-11: qty 2

## 2020-11-11 MED ORDER — CARVEDILOL 6.25 MG PO TABS
3.1250 mg | ORAL_TABLET | Freq: Two times a day (BID) | ORAL | Status: DC
  Administered 2020-11-12: 3.125 mg via ORAL
  Filled 2020-11-11: qty 1

## 2020-11-11 MED ORDER — ONDANSETRON HCL 4 MG/2ML IJ SOLN
4.0000 mg | Freq: Four times a day (QID) | INTRAMUSCULAR | Status: DC | PRN
Start: 2020-11-11 — End: 2020-11-12
  Administered 2020-11-11: 4 mg via INTRAVENOUS
  Filled 2020-11-11: qty 2

## 2020-11-11 MED ORDER — AMLODIPINE BESYLATE 10 MG PO TABS
10.0000 mg | ORAL_TABLET | Freq: Every day | ORAL | Status: DC
  Administered 2020-11-12: 10 mg via ORAL
  Filled 2020-11-11: qty 1

## 2020-11-11 MED ORDER — ATORVASTATIN CALCIUM 20 MG PO TABS
80.0000 mg | ORAL_TABLET | Freq: Every day | ORAL | Status: DC
  Administered 2020-11-11: 80 mg via ORAL
  Filled 2020-11-11: qty 4

## 2020-11-11 MED ORDER — OXYCODONE HCL 5 MG PO TABS: 5.0000 mg | ORAL_TABLET | ORAL | Status: DC | PRN

## 2020-11-11 MED ORDER — ACETAMINOPHEN 650 MG RE SUPP: 650.0000 mg | Freq: Four times a day (QID) | RECTAL | Status: DC | PRN

## 2020-11-11 MED ORDER — ACETAMINOPHEN 325 MG PO TABS
650.0000 mg | ORAL_TABLET | Freq: Four times a day (QID) | ORAL | Status: DC | PRN
  Administered 2020-11-11: 650 mg via ORAL
  Filled 2020-11-11: qty 2

## 2020-11-11 MED ORDER — HYDRALAZINE HCL 50 MG PO TABS
50.0000 mg | ORAL_TABLET | Freq: Three times a day (TID) | ORAL | Status: DC
  Administered 2020-11-11 – 2020-11-12 (×2): 50 mg via ORAL
  Filled 2020-11-11 (×2): qty 1

## 2020-11-11 NOTE — ED Provider Notes (Signed)
[facility] Emergency Department Provider Note    Event Date/Time   First MD Initiated Contact with Patient 11/11/20 1613     (approximate)  I have reviewed the triage vital signs and the nursing notes.   HISTORY  Chief Complaint Shortness of Breath    HPI Miguel Fletcher is a 65 y.o. male with the below listed past medical history status post recent 5 vessel CABG at [facility] who presents to the ER for an episode of shortness of breath that occurred this morning.  According to family was grey and had some discoloration was not complaining of any chest pain or pressure.  It has resolved.  States that he feels fine right now.  When he was seen by EMS he was saying that he was feeling fine but is encouraged to come in.  Been compliant with his medications.  States he has some mild discomfort at the surgical sites but is otherwise overall improving.  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 08/2020 MRI [facility]): Well-circumscribed T1 hypointense, T2 hyperintense enhancing mass within the right axilla measuring 3.1 x 2.6 x 3.0 cm.  Mass extends along the course of the axillary neurovascular structures.  No evidence for invasion into the adjacent musculature.  No enlarged axillary lymph nodes.   Bell's palsy    Biliary colic    CAD (coronary artery disease)    a. 09/2020 NSTEMI/Cath: LM 60-70d, LAD 70p, D2 60, RI 70p, LCX ild diff dzs, RCA 40-70m RPDA 50, RPAV 50; b. 10/2020 CABG x 5 (LIMA->LAD, L radial->RI, VG->D1, VG->RPDA->RPL.   Diastolic dysfunction    a. 09/2020 Echo: EF 60-65%, no rwma, sev LVH. Gr1 DD. Nl RV size/fxn. Mod dil LA.   Hemorrhoids    Hypercholesterolemia    Hypertension    Stroke (cerebrum) (H[facility])    Tobacco use    Family History  Problem Relation Age of Onset   Alzheimer's disease Father    Heart disease Father    CAD Neg Hx    Past Surgical History:  Procedure Laterality Date   CORONARY  ARTERY BYPASS GRAFT N/A 10/28/2020   Procedure: CORONARY ARTERY BYPASS GRAFTING (CABG), ON PUMP, TIMES FIVE, USING LEFT INTERNAL MAMMARY ARTERY, LEFT RADIAL ARTERY AND ENDOSCOPICALLY HARVESTED RIGHT GREATER SAPHENOUS VEIN;  Surgeon: HMelrose Nakayama MD;  [place]  Service: Open Heart Surgery;  Laterality: N/A;   ENDOVEIN HARVEST OF GREATER SAPHENOUS VEIN Right 10/28/2020   Procedure: ENDOVEIN HARVEST OF RIGHT GREATER SAPHENOUS VEIN;  Surgeon: HMelrose Nakayama MD;  [place]  Service: Open Heart Surgery;  Laterality: Right;   INTRAVASCULAR ULTRASOUND/IVUS N/A 10/06/2020   Procedure: Intravascular Ultrasound/IVUS;  Surgeon: [name] MD;  [place];  Service: Cardiovascular;  Laterality: N/A;   LEFT HEART CATH AND CORONARY ANGIOGRAPHY N/A 10/06/2020   Procedure: LEFT HEART CATH AND CORONARY ANGIOGRAPHY;  Surgeon: [name] MD;  [place];  Service: Cardiovascular;  Laterality: N/A;   RADIAL ARTERY HARVEST Left 10/28/2020   Procedure: LEFT RADIAL ARTERY HARVEST;  Surgeon: HMelrose Nakayama MD;  [place]  Service: Open Heart Surgery;  Laterality: Left;   TEE WITHOUT CARDIOVERSION N/A 10/28/2020   Procedure: TRANSESOPHAGEAL ECHOCARDIOGRAM (TEE);  Surgeon: HMelrose Nakayama MD;  [place]  Service: Open Heart Surgery;  Laterality: N/A;   VARICOSE VEIN SURGERY Left    Patient Active Problem List  Diagnosis Date Noted   Pleural effusion on left 11/11/2020   S/P CABG x 5 10/28/2020   CAD (coronary artery disease) 10/22/2020   NSTEMI (non-ST elevated myocardial infarction) ([place]) 10/05/2020   HTN (hypertension) 10/05/2020   HLD (hyperlipidemia) 10/05/2020   Stroke ([place]) 10/05/2020   Tobacco use 10/05/2020   COVID-19 virus infection 10/05/2020   Hypokalemia 10/01/2020   Essential hypertension 10/01/2020   Axillary mass, right 10/01/2020   Chest pain 03/19/2016      Prior to Admission  medications   Medication Sig Start Date End Date Taking? Authorizing Provider  amLODipine (NORVASC) 10 MG tablet Take 1 tablet (10 mg total) by mouth daily. 10/23/20 01/21/21  Rise Mu, PA-C  aspirin EC 325 MG EC tablet Take 1 tablet (325 mg total) by mouth daily. 11/01/20   Nani Skillern, PA-C  atorvastatin (LIPITOR) 80 MG tablet Take 1 tablet (80 mg total) by mouth daily at 8 pm. 11/02/20   Barrett, Lodema Hong, PA-C  carvedilol (COREG) 3.125 MG tablet Take 1 tablet (3.125 mg total) by mouth 2 (two) times daily with a meal. 11/09/20   Theora Gianotti, NP  hydrALAZINE (APRESOLINE) 100 MG tablet Take 0.5 tablets (50 mg total) by mouth 3 (three) times daily. 10/23/20   Dunn, Areta Haber, PA-C  isosorbide mononitrate (IMDUR) 60 MG 24 hr tablet Take 1 tablet (60 mg total) by mouth daily. 10/23/20   Dunn, Areta Haber, PA-C  lisinopril (ZESTRIL) 40 MG tablet Take 1 tablet (40 mg total) by mouth daily. 10/23/20 01/21/21  Rise Mu, PA-C  oxyCODONE (OXY IR/ROXICODONE) 5 MG immediate release tablet Take 1-2 tablets (5-10 mg total) by mouth every 4 (four) hours as needed for severe pain. 11/02/20   Barrett, Lodema Hong, PA-C    Allergies Patient has no known allergies.    Social History Social History   Tobacco Use   Smoking status: Some Days    Packs/day: 2.00    Years: 25.00    Pack years: 50.00    Types: Cigarettes    Last attempt to quit: 11/06/2020    Years since quitting: 0.0   Smokeless tobacco: Never   Tobacco comments:    "I have smoked 4 cigs this week."  Vaping Use   Vaping Use: Never used  Substance Use Topics   Alcohol use: No   Drug use: No    Review of Systems Patient denies headaches, rhinorrhea, blurry vision, numbness, shortness of breath, chest pain, edema, cough, abdominal pain, nausea, vomiting, diarrhea, dysuria, fevers, rashes or hallucinations unless otherwise stated above in HPI. ____________________________________________   PHYSICAL EXAM:  VITAL SIGNS: Vitals:    11/11/20 1338 11/11/20 1511  BP: (!) 127/56 133/70  Pulse: 64 65  Resp: 16 18  Temp: 97.9 F (36.6 C)   SpO2: 98% 97%    Constitutional: Alert and oriented.  Eyes: Conjunctivae are normal.  Head: Atraumatic. Nose: No congestion/rhinnorhea. Mouth/Throat: Mucous membranes are moist.   Neck: No stridor. Painless ROM.  Cardiovascular: Normal rate, regular rhythm. Grossly normal heart sounds.  Good peripheral circulation. Respiratory: Normal respiratory effort.  No retractions. Lungs CTAB. Gastrointestinal: Soft and nontender. No distention. No abdominal bruits. No CVA tenderness. Genitourinary:  Musculoskeletal: No lower extremity tenderness nor edema.  No joint effusions. Neurologic:  Normal speech and language. No gross focal neurologic deficits are appreciated. No facial droop Skin:  Skin is warm, dry and intact. No rash noted. Psychiatric: Mood and affect are normal. Speech and behavior are normal.  ____________________________________________   LABS (all labs ordered are listed, but only abnormal results are displayed)  Results for orders placed or performed during the hospital encounter of 11/11/20 (from the past 24 hour(s))  Basic metabolic panel     Status: Abnormal   Collection Time: 11/11/20  1:39 PM  Result Value Ref Range   Sodium 140 135 - 145 mmol/L   Potassium 3.8 3.5 - 5.1 mmol/L   Chloride 108 98 - 111 mmol/L   CO2 24 22 - 32 mmol/L   Glucose, Bld 94 70 - 99 mg/dL   BUN 11 8 - 23 mg/dL   Creatinine, Ser 0.90 0.61 - 1.24 mg/dL   Calcium 8.7 (L) 8.9 - 10.3 mg/dL   GFR, Estimated >60 >60 mL/min   Anion gap 8 5 - 15  CBC     Status: Abnormal   Collection Time: 11/11/20  1:39 PM  Result Value Ref Range   WBC 6.8 4.0 - 10.5 K/uL   RBC 3.49 (L) 4.22 - 5.81 MIL/uL   Hemoglobin 10.6 (L) 13.0 - 17.0 g/dL   HCT 32.0 (L) 39.0 - 52.0 %   MCV 91.7 80.0 - 100.0 fL   MCH 30.4 26.0 - 34.0 pg   MCHC 33.1 30.0 - 36.0 g/dL   RDW 13.3 11.5 - 15.5 %   Platelets 498 (H)  150 - 400 K/uL   nRBC 0.0 0.0 - 0.2 %  Troponin I (High Sensitivity)     Status: Abnormal   Collection Time: 11/11/20  1:39 PM  Result Value Ref Range   Troponin I (High Sensitivity) 51 (H) <18 ng/L  Troponin I (High Sensitivity)     Status: Abnormal   Collection Time: 11/11/20  3:12 PM  Result Value Ref Range   Troponin I (High Sensitivity) 56 (H) <18 ng/L   ____________________________________________  EKG My review and personal interpretation at Time: 16:30   Indication: sob  Rate: 60  Rhythm: sinus Axis: normal Other: nonspecific st abn, no stemi ____________________________________________  RADIOLOGY  I personally reviewed all radiographic images ordered to evaluate for the above acute complaints and reviewed radiology reports and findings.  These findings were personally discussed with the patient.  Please see medical record for radiology report.  ____________________________________________   PROCEDURES  Procedure(s) performed:  Procedures    Critical Care performed: no ____________________________________________   INITIAL IMPRESSION / ASSESSMENT AND PLAN / ED COURSE  Pertinent labs & imaging results that were available during my care of the patient were reviewed by me and considered in my medical decision making (see chart for details).   DDX: ACS, pericarditis, pe, dissection, pna, bronchitis, costochondritis   Miguel Fletcher is a 65 y.o. who presents to the ED with presentation as described above.  Currently is well-appearing in no acute distress.  EKG with nonspecific changes but likely normal post CABG.  Troponin profile is stable.  He is medically any chest pain does have evidence of interval development of left pleural effusion is moderate in size is not hypoxic at this time given recent CABG and high risk presentation not on diuretics will order IV Lasix.  I discussed case in consultation with Dr. Garen Lah of cardiology who agrees would be appropriate  for admission to the hospital for IV diuresis and consultation in the morning.  Patient is agreeable to plan.  Discussed case with hospitalist who kindly agrees to admit the patient.    The patient was evaluated in Emergency Department today for the symptoms described in  the history of present illness. He/she was evaluated in the context of the global COVID-19 pandemic, which necessitated consideration that the patient might be at risk for infection with the SARS-CoV-2 virus that causes COVID-19. Institutional protocols and algorithms that pertain to the evaluation of patients at risk for COVID-19 are in a state of rapid changebased on information released by regulatory bodies including the CDC and federal and state organizations. These policies and algorithms were followed during the patient's care in the ED.  As part of my medical decision making, I reviewed the following data within the [place] notes reviewed and incorporated, Labs reviewed, notes from prior ED visits and [place] Controlled Substance Database   ____________________________________________   FINAL CLINICAL IMPRESSION(S) / ED DIAGNOSES  Final diagnoses:  Pleural effusion  Shortness of breath      NEW MEDICATIONS STARTED DURING THIS VISIT:  New Prescriptions   No medications on file     Note:  This document was prepared using Dragon voice recognition software and may include unintentional dictation errors.    Merlyn Lot, MD 11/11/20 1739

## 2020-11-11 NOTE — Telephone Encounter (Signed)
Reviewed the patient's chart in preparation to call him back. He is currently in the ER at [facility] - noted by ER RN at 1301 that he was "feeling fine" on arrival, but was requesting to be seen.  Will follow along ER course.

## 2020-11-11 NOTE — ED Triage Notes (Signed)
Pt comes into the ED via EMS from home with c/o SOB, states he was "feeling fine: when they arrived but still wanted to be seen. A/ox4  120/64 HR60 CBG113 97.4 temp 98% 2L [place] 92-93%RA

## 2020-11-11 NOTE — H&P (Signed)
History and Physical   Miguel Fletcher JJK:[id number] DOB: [birth year] DOA: 11/11/2020  PCP: [facility]  Outpatient Specialists: [facility] Cardiology Patient coming from: home via EMS  I have personally briefly reviewed patient's old medical records in [facility] EMR.  Chief Concern: Shortness of breath  HPI: Miguel Fletcher is a 65 y.o. male with medical history significant for CAD status post CABG in August 2022, tobacco abuse, hyperlipidemia, hypertension, presents to the emergency department from home via EMS for chief concerns of shortness of breath.  At bedside patient is awake and alert and oriented.  The shortness of breath started approximately 9-9:30 AM after he took his AM medications. The shortness of breath lasted until presentation to the ED. upon further discussion, patient states that his shortness of breath actually started on 11/09/2020 in the evening.  He states that shortness of breath is worse when he was laying flat.  He reports that shortness of breath went away on 11/10/2020 thus he did not present to the emergency department.  He reports he has never had this shortness of breath before. He reports this feels nothing like the symptoms he felt when he had a heart attack requiring CABG.  He reports that initially went away when he received supplemental oxygen via nasal cannula. He reports that at present his shortness of breath has gone away and that he does not require any more nasal cannula supplementation.  He denies fever, nausea, vomiting, dysuria, hematuria, diarrhea, syncope, loss of consciousness.  Social history: He lives at home with his wife. He is currently tobacco user, smokes 1-2 cigarettes per day now. At his peak, he smoked 1-2 ppd and he started at age 30. He quit etoh 10 years ago. He denies recreational drug use. He is semi retired. He formerly worked in [employer].   Vaccination history: He is vaccinated for covid 19, two doses. He does  not remember the name of the vaccine manufactor.   ROS: Constitutional: no weight change, no fever ENT/Mouth: no sore throat, no rhinorrhea Eyes: no eye pain, no vision changes Cardiovascular: no chest pain, no dyspnea,  no edema, no palpitations Respiratory: no cough, no sputum, no wheezing Gastrointestinal: no nausea, no vomiting, no diarrhea, no constipation Genitourinary: no urinary incontinence, no dysuria, no hematuria Musculoskeletal: no arthralgias, no myalgias Skin: no skin lesions, no pruritus, Neuro: + weakness, no loss of consciousness, no syncope Psych: no anxiety, no depression, + decrease appetite Heme/Lymph: no bruising, no bleeding  ED Course: Discussed with emergency medicine provider, patient requiring hospitalization for shortness of breath in setting of left pleural effusion and recent CABG.  Vitals in the emergency department was remarkable for temperature 97.9, respiration rate of 16, heart rate 64, blood pressure 127/56, SPO2 of 98% on room air.  Labs in the emergency department was remarkable for sodium 140, potassium 3.8, chloride 108, bicarb 24, BUN 11, serum creatinine of 0.90, nonfasting blood glucose 94, WBC 6.8, hemoglobin 10.6, platelets 498.  eGFR 60.  Troponin was 51 and increased to 56.  EDP ordered furosemide 40 mg IV once.  EDP consulted cardiology, Dr. Garen Lah recommends admission and cardiology will round on patient in the a.m.  Assessment/Plan  Principal Problem:   Pleural effusion on left Active Problems:   Essential hypertension   HTN (hypertension)   HLD (hyperlipidemia)   Tobacco use   CAD (coronary artery disease)   S/P CABG x 5   # Shortness of breath-has been consulted by EDP is likely multifactorial  including left pleural effusion - Ultrasound-guided thoracentesis on the left side ordered with gram stain, culture ordered - Status post furosemide 40 mg IV per EDP - Furosemide 40 mg IV daily ordered for 11/12/2020, 2 doses  #  Hyperlipidemia-atorvastatin 80 mg nightly resumed  # Hypertension-controlled - Patient takes lisinopril 40 mg daily, isosorbide mononitrate 60 mg daily, hydralazine 50 mg 3 times daily, amlodipine 10 mg daily - His home antihypertensives have been resumed  # CAD status post CABG x5 - He took his aspirin 81 mg in the AM prior to ED presentation  - Per chart review patient needs to be taking aspirin 324 mg daily, this has been resumed - Coreg 3.125 mg p.o. twice daily, Imdur 60 mg daily, high intensity statin resumed - Patient had endoscopic vein harvest of the right thigh and left radial harvest  # Elevated troponin-patient denies chest pain, I suspect this is secondary to the left pleural effusion - Cardiology has been consulted by EDP and will see the patient in the a.m.  # DVT prophylaxis-heparin 5000 units subcutaneous, 2 doses ordered in anticipation of ultrasound-guided paracentesis - A.m. team to resume pharmacologic DVT prophylaxis when appropriate  Chart reviewed.   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 08/2020 MRI [facility]): Well-circumscribed T1 hypointense, T2 hyperintense enhancing mass within the right axilla measuring 3.1 x 2.6 x 3.0 cm.  Mass extends along the course of the axillary neurovascular structures.  No evidence for invasion into the adjacent musculature.  No enlarged axillary lymph nodes.   Bell's palsy    Biliary colic    CAD (coronary artery  disease)    a. 09/2020 NSTEMI/Cath: LM 60-70d, LAD 70p, D2 60, RI 70p, LCX ild diff dzs, RCA 40-64m, RPDA 50, RPAV 50; b. 10/2020 CABG x 5 (LIMA->LAD, L radial->RI, VG->D1, VG->RPDA->RPL.   Diastolic dysfunction    a. 09/2020 Echo: EF 60-65%, no rwma, sev LVH. Gr1 DD. Nl RV size/fxn. Mod dil LA.   Hemorrhoids    Hypercholesterolemia    Hypertension    Stroke (cerebrum) ([place])    Tobacco use    Past Surgical History:  Procedure Laterality Date   CORONARY ARTERY BYPASS GRAFT N/A 10/28/2020   Procedure: CORONARY ARTERY BYPASS GRAFTING (CABG), ON PUMP, TIMES FIVE, USING LEFT INTERNAL MAMMARY ARTERY, LEFT RADIAL ARTERY AND ENDOSCOPICALLY HARVESTED RIGHT GREATER SAPHENOUS VEIN;  Surgeon: Melrose Nakayama, MD;  Location: [place];  Service: Open Heart Surgery;  Laterality: N/A;   ENDOVEIN HARVEST OF GREATER SAPHENOUS VEIN Right 10/28/2020   Procedure: ENDOVEIN HARVEST OF RIGHT GREATER SAPHENOUS VEIN;  Surgeon: Melrose Nakayama, MD;  Location: [place];  Service: Open Heart Surgery;  Laterality: Right;   INTRAVASCULAR ULTRASOUND/IVUS N/A 10/06/2020   Procedure: Intravascular Ultrasound/IVUS;  Surgeon: Sherren Mocha, MD;  Location: [place] [facility];  Service: Cardiovascular;  Laterality: N/A;   LEFT HEART CATH AND CORONARY ANGIOGRAPHY N/A 10/06/2020   Procedure: LEFT HEART CATH AND CORONARY ANGIOGRAPHY;  Surgeon: Sherren Mocha, MD;  Location: [place] [facility];  Service: Cardiovascular;  Laterality: N/A;  RADIAL ARTERY HARVEST Left 10/28/2020   Procedure: LEFT RADIAL ARTERY HARVEST;  Surgeon: Melrose Nakayama, MD;  Location: [place];  Service: Open Heart Surgery;  Laterality: Left;   TEE WITHOUT CARDIOVERSION N/A 10/28/2020   Procedure: TRANSESOPHAGEAL ECHOCARDIOGRAM (TEE);  Surgeon: Melrose Nakayama, MD;  Location: [place];  Service: Open Heart Surgery;  Laterality: N/A;   VARICOSE VEIN SURGERY Left    Social History:  reports that he has been smoking cigarettes. He has a 50.00  pack-year smoking history. He has never used smokeless tobacco. He reports that he does not drink alcohol and does not use drugs.  No Known Allergies Family History  Problem Relation Age of Onset   Alzheimer's disease Father    Heart disease Father    CAD Neg Hx    Family history: Family history reviewed and not pertinent  Prior to Admission medications   Medication Sig Start Date End Date Taking? Authorizing Provider  amLODipine (NORVASC) 10 MG tablet Take 1 tablet (10 mg total) by mouth daily. 10/23/20 01/21/21  Rise Mu, PA-C  aspirin EC 325 MG EC tablet Take 1 tablet (325 mg total) by mouth daily. 11/01/20   Nani Skillern, PA-C  atorvastatin (LIPITOR) 80 MG tablet Take 1 tablet (80 mg total) by mouth daily at 8 pm. 11/02/20   Barrett, Lodema Hong, PA-C  carvedilol (COREG) 3.125 MG tablet Take 1 tablet (3.125 mg total) by mouth 2 (two) times daily with a meal. 11/09/20   Theora Gianotti, NP  hydrALAZINE (APRESOLINE) 100 MG tablet Take 0.5 tablets (50 mg total) by mouth 3 (three) times daily. 10/23/20   Dunn, Areta Haber, PA-C  isosorbide mononitrate (IMDUR) 60 MG 24 hr tablet Take 1 tablet (60 mg total) by mouth daily. 10/23/20   Dunn, Areta Haber, PA-C  lisinopril (ZESTRIL) 40 MG tablet Take 1 tablet (40 mg total) by mouth daily. 10/23/20 01/21/21  Rise Mu, PA-C  oxyCODONE (OXY IR/ROXICODONE) 5 MG immediate release tablet Take 1-2 tablets (5-10 mg total) by mouth every 4 (four) hours as needed for severe pain. 11/02/20   Barrett, Lodema Hong, PA-C   Physical Exam: Vitals:   11/11/20 1338 11/11/20 1511  BP: (!) 127/56 133/70  Pulse: 64 65  Resp: 16 18  Temp: 97.9 F (36.6 C)   TempSrc: Oral   SpO2: 98% 97%   Constitutional: appears age-appropriate, NAD, calm, comfortable Eyes: PERRL, lids and conjunctivae normal ENMT: Mucous membranes are moist. Posterior pharynx clear of any exudate or lesions. Age-appropriate dentition. Hearing appropriate Neck: normal, supple, no masses, no  thyromegaly Respiratory: Decreased lung sounds in the left lower lobe, no wheezing, no crackles. Normal respiratory effort. No accessory muscle use.  Cardiovascular: Regular rate and rhythm, no murmurs / rubs / gallops.  Trace left lower extremity swelling that is chronic per patient. 2+ pedal pulses. No carotid bruits.  Abdomen: no tenderness, no masses palpated, no hepatosplenomegaly. Bowel sounds positive.  Musculoskeletal: no clubbing / cyanosis. No joint deformity upper and lower extremities. Good ROM, no contractures, no atrophy. Normal muscle tone.  Skin: no rashes, lesions, ulcers. No induration Neurologic: Sensation intact. Strength 5/5 in all 4.  Psychiatric: Normal judgment and insight. Alert and oriented x 3. Normal mood.   EKG: independently reviewed, showing sinus rhythm with rate of 62, QTc 489  Chest x-ray on Admission: I personally reviewed and I agree with radiologist reading as below.  DG Chest 2 View  Result Date: 11/11/2020 CLINICAL DATA:  Shortness of  breath EXAM: CHEST - 2 VIEW COMPARISON:  11/02/2020 FINDINGS: Post CABG changes. Stable cardiomegaly. Moderate left pleural effusion increased in size from prior. Trace right pleural effusion, decreased from prior. Left basilar atelectasis. No pneumothorax. IMPRESSION: Moderate left pleural effusion, increased in size from prior. Electronically Signed   By: Davina Poke D.O.   On: 11/11/2020 14:19    Labs on Admission: I have personally reviewed following labs  CBC: Recent Labs  Lab 11/09/20 1429 11/11/20 1339  WBC 7.6 6.8  HGB 10.9* 10.6*  HCT 32.7* 32.0*  MCV 91 91.7  PLT 492* 469*   Basic Metabolic Panel: Recent Labs  Lab 11/09/20 1429 11/11/20 1339  NA 140 140  K 4.2 3.8  CL 104 108  CO2 24 24  GLUCOSE 95 94  BUN 10 11  CREATININE 0.99 0.90  CALCIUM 9.3 8.7*   GFR: Estimated Creatinine Clearance: 93.1 mL/min (by C-G formula based on SCr of 0.9 mg/dL).  Liver Function Tests: Recent Labs  Lab  11/09/20 1429  AST 34  ALT 47*  ALKPHOS 121  BILITOT 0.6  PROT 6.7  ALBUMIN 3.5*   Lipid Profile: Recent Labs    11/09/20 1429  CHOL 72*  HDL 21*  LDLCALC 33  TRIG 87  CHOLHDL 3.4  LDLDIRECT 30   Urine analysis:    Component Value Date/Time   COLORURINE AMBER (A) 10/26/2020 1513   APPEARANCEUR HAZY (A) 10/26/2020 1513   APPEARANCEUR Clear 04/22/2011 0951   LABSPEC 1.021 10/26/2020 1513   LABSPEC 1.004 04/22/2011 0951   PHURINE 5.0 10/26/2020 1513   GLUCOSEU NEGATIVE 10/26/2020 1513   GLUCOSEU Negative 04/22/2011 0951   HGBUR NEGATIVE 10/26/2020 1513   BILIRUBINUR NEGATIVE 10/26/2020 1513   BILIRUBINUR Negative 04/22/2011 [place] 10/26/2020 1513   PROTEINUR NEGATIVE 10/26/2020 1513   NITRITE NEGATIVE 10/26/2020 1513   LEUKOCYTESUR NEGATIVE 10/26/2020 1513   LEUKOCYTESUR Negative 04/22/2011 0951   Dr. Tobie Poet [facility]  If 7PM-7AM, please contact overnight-coverage provider If 7AM-7PM, please contact day coverage provider [email or web address]  11/11/2020, 5:16 PM

## 2020-11-11 NOTE — Telephone Encounter (Signed)
Pt c/o Shortness Of Breath: STAT if SOB developed within the last 24 hours or pt is noticeably SOB on the phone  1. Are you currently SOB (can you hear that pt is SOB on the phone)? Yes wife calling   2. How long have you been experiencing SOB? 2 days   3. Are you SOB when sitting or when up moving around? All the time starts after taking medication   4. Are you currently experiencing any other symptoms? Pain from cabg   Per wife patient does not like taking his medication .  She is not sure what to do .

## 2020-11-11 NOTE — Telephone Encounter (Signed)
Miguel Fletcher wife, Miguel Fletcher, contacted the office stating her husband could not breath. Per wife, she currently was not at home with her husband so could not immediately assess him. She states that she encouraged him to take his medications but he declined stating that he could not breath. Wife states they do not own a device to monitor oxygen saturation. Per wife, she states she has tried to take him the the Emergency Room but he declined. Per wife, patient looked a little more "grey" in color this morning than yesterday. Advised wife to either call EMS or take patient to the emergency room for evaluation. Wife verbalizes understanding.

## 2020-11-11 NOTE — ED Notes (Signed)
Vaughan Basta RN aware of assigned bed

## 2020-11-12 ENCOUNTER — Observation Stay: Payer: Self-pay | Admitting: Radiology

## 2020-11-12 ENCOUNTER — Observation Stay (HOSPITAL_COMMUNITY)
Admit: 2020-11-12 | Discharge: 2020-11-12 | Disposition: A | Discharge status: Home / Self Care | Payer: Self-pay | Attending: Medical | Admitting: Medical

## 2020-11-12 ENCOUNTER — Other Ambulatory Visit: Payer: Self-pay

## 2020-11-12 ENCOUNTER — Inpatient Hospital Stay: Payer: Self-pay

## 2020-11-12 DIAGNOSIS — R0609 Other forms of dyspnea: Secondary | ICD-10-CM

## 2020-11-12 DIAGNOSIS — I10 Essential (primary) hypertension: Secondary | ICD-10-CM

## 2020-11-12 DIAGNOSIS — Z951 Presence of aortocoronary bypass graft: Secondary | ICD-10-CM

## 2020-11-12 DIAGNOSIS — J90 Pleural effusion, not elsewhere classified: Secondary | ICD-10-CM

## 2020-11-12 DIAGNOSIS — E782 Mixed hyperlipidemia: Secondary | ICD-10-CM

## 2020-11-12 DIAGNOSIS — I509 Heart failure, unspecified: Secondary | ICD-10-CM

## 2020-11-12 DIAGNOSIS — Z720 Tobacco use: Secondary | ICD-10-CM

## 2020-11-12 DIAGNOSIS — I25118 Atherosclerotic heart disease of native coronary artery with other forms of angina pectoris: Secondary | ICD-10-CM

## 2020-11-12 HISTORY — PX: IR THORACENTESIS ASP PLEURAL SPACE W/IMG GUIDE: IMG5380

## 2020-11-12 LAB — CBC
HCT: 33.7 % — ABNORMAL LOW (ref 39.0–52.0)
Hemoglobin: 11.5 g/dL — ABNORMAL LOW (ref 13.0–17.0)
MCH: 31.7 pg (ref 26.0–34.0)
MCHC: 34.1 g/dL (ref 30.0–36.0)
MCV: 92.8 fL (ref 80.0–100.0)
Platelets: 423 10*3/uL — ABNORMAL HIGH (ref 150–400)
RBC: 3.63 MIL/uL — ABNORMAL LOW (ref 4.22–5.81)
RDW: 13.3 % (ref 11.5–15.5)
WBC: 5.3 10*3/uL (ref 4.0–10.5)
nRBC: 0 % (ref 0.0–0.2)

## 2020-11-12 LAB — ECHOCARDIOGRAM LIMITED
Height: 68 in
S' Lateral: 2.8 cm
Weight: 3188.73 oz

## 2020-11-12 LAB — BODY FLUID CELL COUNT WITH DIFFERENTIAL
Eos, Fluid: 22 %
Lymphs, Fluid: 44 %
Monocyte-Macrophage-Serous Fluid: 28 %
Neutrophil Count, Fluid: 6 %
Total Nucleated Cell Count, Fluid: 1732 cu mm

## 2020-11-12 LAB — BASIC METABOLIC PANEL
Anion gap: 9 (ref 5–15)
BUN: 13 mg/dL (ref 8–23)
CO2: 25 mmol/L (ref 22–32)
Calcium: 8.7 mg/dL — ABNORMAL LOW (ref 8.9–10.3)
Chloride: 106 mmol/L (ref 98–111)
Creatinine, Ser: 0.85 mg/dL (ref 0.61–1.24)
GFR, Estimated: >60 mL/min (ref >60)
Glucose, Bld: 89 mg/dL (ref 70–99)
Potassium: 3.7 mmol/L (ref 3.5–5.1)
Sodium: 140 mmol/L (ref 135–145)

## 2020-11-12 LAB — PATHOLOGIST SMEAR REVIEW

## 2020-11-12 LAB — SARS CORONAVIRUS 2 (TAT 6-24 HRS): SARS Coronavirus 2: NEGATIVE

## 2020-11-12 MED ORDER — ACETAMINOPHEN 650 MG RE SUPP: 650.0000 mg | Freq: Four times a day (QID) | RECTAL | Status: DC | PRN

## 2020-11-12 MED ORDER — ACETAMINOPHEN 325 MG PO TABS: 650.0000 mg | ORAL_TABLET | Freq: Four times a day (QID) | ORAL | Status: DC | PRN

## 2020-11-12 MED ORDER — FUROSEMIDE 20 MG PO TABS: 20.0000 mg | ORAL_TABLET | Freq: Every day | ORAL | 0 refills | Status: DC

## 2020-11-12 MED ORDER — LIDOCAINE 1% INJECTION FOR CIRCUMCISION
INJECTION | INTRAVENOUS | Status: DC | PRN
  Administered 2020-11-12: 8 mL via SUBCUTANEOUS

## 2020-11-12 NOTE — Progress Notes (Signed)
PROGRESS NOTE    Miguel Fletcher  [id number] DOB: 08-31-1955 DOA: 11/11/2020 PCP: [facility]   Brief Narrative:  65 y.o. male with medical history significant for CAD status post CABG in August 2022, tobacco abuse, hyperlipidemia, hypertension, presents to the emergency department from home via EMS for chief concerns of shortness of breath.   At bedside patient is awake and alert and oriented.   The shortness of breath started approximately 9-9:30 AM after he took his AM medications. The shortness of breath lasted until presentation to the ED. upon further discussion, patient states that his shortness of breath actually started on 11/09/2020 in the evening.  He states that shortness of breath is worse when he was laying flat.  He reports that shortness of breath went away on 11/10/2020 thus he did not present to the emergency department.   He reports he has never had this shortness of breath before. He reports this feels nothing like the symptoms he felt when he had a heart attack requiring CABG.  He was found to have a moderate left-sided pleural effusion and symptoms consistent with acute decompensated heart failure with preserved ejection fraction.  EDP spoke with cardiology who recommended medicine admission.  Cardiology consultation noted.   Assessment & Plan:   Principal Problem:   Pleural effusion on left Active Problems:   Essential hypertension   HTN (hypertension)   HLD (hyperlipidemia)   Tobacco use   CAD (coronary artery disease)   S/P CABG x 5  Left-sided pleural effusion Acute decompensated diastolic congestive heart failure Shortness of breath Shortness of breath is improved Status post 40 mg IV Lasix in ED Thoracentesis ordered and pending Plan: Continue Lasix IV Thoracentesis today Anticipated date of discharge 8/26 Will likely need Lasix as outpatient  Hyperlipidemia High intensity statin  Hypertension Adequate control over  interval PTA lisinopril 40 mg daily, isosorbide 60 mg daily, hydralazine 50 mg 3 times daily, amlodipine 10 mg daily  CAD status post CABG x5 No chest pain, stable PTA aspirin, Coreg 3.125 mg twice daily, Imdur 60 mg daily, statin  Elevated troponin Likely secondary to supply demand ischemia in setting of left pleural effusion and decompensated heart failure.  No chest pain   DVT prophylaxis: SQ heparin Code Status: Full Family Communication: None Disposition Plan: Status is: Observation  The patient will require care spanning > 2 midnights and should be moved to inpatient because: Inpatient level of care appropriate due to severity of illness  Dispo: The patient is from: Home              Anticipated d/c is to: Home              Patient currently is not medically stable to d/c.   Difficult to place patient No       Level of care: Med-Surg  Consultants:  Cardiology-[facility]  Procedures:  None  Antimicrobials:  None   Subjective: Seen and examined.  Endorses improvement in shortness of breath.  No pain complaints  Objective: Vitals:   11/11/20 2210 11/12/20 0217 11/12/20 0501 11/12/20 0804  BP: (!) 160/64  (!) 157/74 (!) 151/74  Pulse: 75  68 72  Resp: '19  16 18  '$ Temp: 99.2 F (37.3 C)  99.1 F (37.3 C) 98.5 F (36.9 C)  TempSrc: Oral     SpO2: 95%  96% 98%  Weight:  90.4 kg    Height:  '5\' 8"'$  (1.727 m)      Intake/Output  Summary (Last 24 hours) at 11/12/2020 1307 Last data filed at 11/12/2020 [id number] Gross per 24 hour  Intake --  Output 600 ml  Net -600 ml   Filed Weights   11/12/20 0217  Weight: 90.4 kg    Examination:  General exam: Appears calm and comfortable  Respiratory system: Bilateral crackles, lung sounds decreased on the left, normal work of breathing, room air Cardiovascular system: S1-S2, regular rate and rhythm, no murmurs, trace pedal edema Gastrointestinal system: Abdomen is nondistended, soft and nontender. No organomegaly or masses  felt. Normal bowel sounds heard. Central nervous system: Alert and oriented. No focal neurological deficits. Extremities: Symmetric 5 x 5 power. Skin: No rashes, lesions or ulcers Psychiatry: Judgement and insight appear normal. Mood & affect appropriate.     Data Reviewed: I have personally reviewed following labs and imaging studies  CBC: Recent Labs  Lab 11/09/20 1429 11/11/20 1339 11/12/20 0608  WBC 7.6 6.8 5.3  HGB 10.9* 10.6* 11.5*  HCT 32.7* 32.0* 33.7*  MCV 91 91.7 92.8  PLT 492* 498* [id number]*   Basic Metabolic Panel: Recent Labs  Lab 11/09/20 1429 11/11/20 1339 11/12/20 0608  NA 140 140 140  K 4.2 3.8 3.7  CL 104 108 106  CO2 '24 24 25  '$ GLUCOSE 95 94 89  BUN '10 11 13  '$ CREATININE 0.99 0.90 0.85  CALCIUM 9.3 8.7* 8.7*   GFR: Estimated Creatinine Clearance: 95.9 mL/min (by C-G formula based on SCr of 0.85 mg/dL). Liver Function Tests: Recent Labs  Lab 11/09/20 1429  AST 34  ALT 47*  ALKPHOS 121  BILITOT 0.6  PROT 6.7  ALBUMIN 3.5*   No results for input(s): LIPASE, AMYLASE in the last 168 hours. No results for input(s): AMMONIA in the last 168 hours. Coagulation Profile: No results for input(s): INR, PROTIME in the last 168 hours. Cardiac Enzymes: No results for input(s): CKTOTAL, CKMB, CKMBINDEX, TROPONINI in the last 168 hours. BNP (last 3 results) No results for input(s): PROBNP in the last 8760 hours. HbA1C: No results for input(s): HGBA1C in the last 72 hours. CBG: No results for input(s): GLUCAP in the last 168 hours. Lipid Profile: Recent Labs    11/09/20 1429  CHOL 72*  HDL 21*  LDLCALC 33  TRIG 87  CHOLHDL 3.4  LDLDIRECT 30   Thyroid Function Tests: No results for input(s): TSH, T4TOTAL, FREET4, T3FREE, THYROIDAB in the last 72 hours. Anemia Panel: No results for input(s): VITAMINB12, FOLATE, FERRITIN, TIBC, IRON, RETICCTPCT in the last 72 hours. Sepsis Labs: No results for input(s): PROCALCITON, LATICACIDVEN in the last 168  hours.  Recent Results (from the past 240 hour(s))  SARS CORONAVIRUS 2 (TAT 6-24 HRS) Nasopharyngeal Nasopharyngeal Swab     Status: None   Collection Time: 11/11/20  5:29 PM   Specimen: Nasopharyngeal Swab  Result Value Ref Range Status   SARS Coronavirus 2 NEGATIVE NEGATIVE Final    Comment: Performed at [facility], [place] [address], [place], [place] [address]         Radiology Studies: DG Chest 2 View  Result Date: 11/11/2020 CLINICAL DATA:  Shortness of breath EXAM: CHEST - 2 VIEW COMPARISON:  11/02/2020 FINDINGS: Post CABG changes. Stable cardiomegaly. Moderate left pleural effusion increased in size from prior. Trace right pleural effusion, decreased from prior. Left basilar atelectasis. No pneumothorax. IMPRESSION: Moderate left pleural effusion, increased in size from prior. Electronically Signed   By: Davina Poke D.O.   On: 11/11/2020 14:19  Scheduled Meds:  amLODipine  10 mg Oral Daily   aspirin  325 mg Oral Daily   atorvastatin  80 mg Oral Q2000   carvedilol  3.125 mg Oral BID WC   furosemide  40 mg Intravenous Daily   hydrALAZINE  50 mg Oral TID   isosorbide mononitrate  60 mg Oral Daily   lisinopril  40 mg Oral Daily   Continuous Infusions:   LOS: 0 days    Time spent: 25 minutes    Sidney Ace, MD [facility] Pager [phone]  If 7PM-7AM, please contact night-coverage  11/12/2020, 1:07 PM

## 2020-11-12 NOTE — Discharge Summary (Signed)
Physician Discharge Summary  Miguel Fletcher [id number] DOB: 1955/05/24 DOA: 11/11/2020  [facility] [place] date: 11/11/2020 Discharge date: 11/12/2020  Admitted From: Home Disposition:  Home  Recommendations for Outpatient Follow-up:  Follow up with PCP in 1-2 weeks Follow up with cardiology as directed Follow up with CT surgery as directed  Home Health:No Equipment/Devices:None  Discharge Condition:Stable CODE STATUS:Full Diet recommendation: Heart Healthy  Brief/Interim Summary:  65 y.o. male with medical history significant for CAD status post CABG in August 2022, tobacco abuse, hyperlipidemia, hypertension, presents to the emergency department from home via EMS for chief concerns of shortness of breath.   At bedside patient is awake and alert and oriented.   The shortness of breath started approximately 9-9:30 AM after he took his AM medications. The shortness of breath lasted until presentation to the ED. upon further discussion, patient states that his shortness of breath actually started on 11/09/2020 in the evening.  He states that shortness of breath is worse when he was laying flat.  He reports that shortness of breath went away on 11/10/2020 thus he did not present to the emergency department.   He reports he has never had this shortness of breath before. He reports this feels nothing like the symptoms he felt when he had a heart attack requiring CABG.   He was found to have a moderate left-sided pleural effusion and symptoms consistent with acute decompensated heart failure with preserved ejection fraction.  EDP spoke with cardiology who recommended medicine admission.  Cardiology consultation noted.  S/p thora w 800cc blood tinged fluid.  D/w cardiology.  Effusion common after CABG.  OK for discharge.  Will prescribe lasix '20mg'$  daily on DC.  Follow up outpatient cardiology and CT surgery.  Discharge Diagnoses:  Principal Problem:   Pleural  effusion on left Active Problems:   Essential hypertension   HTN (hypertension)   HLD (hyperlipidemia)   Tobacco use   CAD (coronary artery disease)   S/P CABG x 5   Acute decompensated heart failure (HCC) Left-sided pleural effusion Acute decompensated diastolic congestive heart failure Shortness of breath Shortness of breath is improved Status post 40 mg IV Lasix in ED Thoracentesis completed, 800cc blood tinged fluid noted Dw/ cardiology, effusion common after CABG OK for discharge Lasix '20mg'$  daily added to dc med rec FU OP cardiology and CTS   Hyperlipidemia High intensity statin   Hypertension Adequate control over interval PTA lisinopril 40 mg daily, isosorbide 60 mg daily, hydralazine 50 mg 3 times daily, amlodipine 10 mg daily   CAD status post CABG x5 No chest pain, stable PTA aspirin, Coreg 3.125 mg twice daily, Imdur 60 mg daily, statin   Elevated troponin Likely secondary to supply demand ischemia in setting of left pleural effusion and decompensated heart failure.  No chest pain   Discharge Instructions  Discharge Instructions     Diet - low sodium heart healthy   Complete by: As directed    Increase activity slowly   Complete by: As directed       Allergies as of 11/12/2020   No Known Allergies      Medication List     TAKE these medications    amLODipine 10 MG tablet Commonly known as: NORVASC Take 1 tablet (10 mg total) by mouth daily.   aspirin 325 MG EC tablet Take 1 tablet (325 mg total) by mouth daily.   atorvastatin 80 MG tablet Commonly known as: LIPITOR Take 1 tablet (80 mg  total) by mouth daily at 8 pm.   carvedilol 3.125 MG tablet Commonly known as: COREG Take 1 tablet (3.125 mg total) by mouth 2 (two) times daily with a meal.   furosemide 20 MG tablet Commonly known as: Lasix Take 1 tablet (20 mg total) by mouth daily.   hydrALAZINE 100 MG tablet Commonly known as: APRESOLINE Take 0.5 tablets (50 mg total) by mouth  3 (three) times daily.   isosorbide mononitrate 60 MG 24 hr tablet Commonly known as: IMDUR Take 1 tablet (60 mg total) by mouth daily.   lisinopril 40 MG tablet Commonly known as: ZESTRIL Take 1 tablet (40 mg total) by mouth daily.   oxyCODONE 5 MG immediate release tablet Commonly known as: Oxy IR/ROXICODONE Take 1-2 tablets (5-10 mg total) by mouth every 4 (four) hours as needed for severe pain.        No Known Allergies  Consultations: Cardiology- [facility]   Procedures/Studies: DG Chest 2 View  Result Date: 11/11/2020 CLINICAL DATA:  Shortness of breath EXAM: CHEST - 2 VIEW COMPARISON:  11/02/2020 FINDINGS: Post CABG changes. Stable cardiomegaly. Moderate left pleural effusion increased in size from prior. Trace right pleural effusion, decreased from prior. Left basilar atelectasis. No pneumothorax. IMPRESSION: Moderate left pleural effusion, increased in size from prior. Electronically Signed   By: Davina Poke D.O.   On: 11/11/2020 14:19   DG Chest 2 View  Result Date: 11/02/2020 CLINICAL DATA:  Persistent chest pain and weakness. Recent cardiac surgery. EXAM: CHEST - 2 VIEW COMPARISON:  10/31/2020 and CT chest 10/01/2020. FINDINGS: Trachea is midline. Heart is enlarged, as before. Left basilar atelectasis with overall improved aeration from 10/31/2020. Small bilateral pleural effusions. No pneumothorax. IMPRESSION: 1. Left lower lobe atelectasis with improving aeration from 10/31/2020. 2. Small bilateral pleural effusions. Electronically Signed   By: Lorin Picket M.D.   On: 11/02/2020 11:15   DG Chest 2 View  Result Date: 10/31/2020 CLINICAL DATA:  Coronary artery bypass grafting EXAM: CHEST - 2 VIEW COMPARISON:  10/30/2020 FINDINGS: Cardiac shadow is enlarged but stable. Postsurgical changes are again seen. Right jugular central line has been removed in the interval. Lungs are hypoinflated but clear. Pneumothorax is seen. No bony abnormality is noted. IMPRESSION:  Improved aeration in the left base. No acute abnormality noted. Electronically Signed   By: Inez Catalina M.D.   On: 10/31/2020 18:03   DG Chest 2 View  Result Date: 10/27/2020 CLINICAL DATA:  Preop.  Coronary artery disease. EXAM: CHEST - 2 VIEW COMPARISON:  October 05, 2020. FINDINGS: The heart size and mediastinal contours are within normal limits. Both lungs are clear. The visualized skeletal structures are unremarkable. IMPRESSION: No active cardiopulmonary disease. Electronically Signed   By: Marijo Conception M.D.   On: 10/27/2020 07:40   DG Chest Port 1 View  Result Date: 11/12/2020 CLINICAL DATA:  Left-sided thoracentesis, left pleural effusion EXAM: PORTABLE CHEST 1 VIEW COMPARISON:  11/11/2020 FINDINGS: Single frontal view of the chest demonstrates a stable cardiac silhouette. There is slight decrease in the left pleural effusion after interval thoracentesis. Persistent opacity at the left lung base compatible with residual effusion and consolidation. No pneumothorax. Right chest is clear. No acute bony abnormalities. IMPRESSION: 1. Minimal decrease in left basilar opacity, consistent with residual effusion and consolidation after interval thoracentesis. 2. No evidence of pneumothorax. Electronically Signed   By: Randa Ngo M.D.   On: 11/12/2020 16:09   DG Chest Port 1 View  Result Date: 10/30/2020 CLINICAL DATA:  Status post coronary bypass graft. EXAM: PORTABLE CHEST 1 VIEW COMPARISON:  October 29, 2020. FINDINGS: Stable cardiomegaly. Status post coronary bypass graft. Right internal jugular catheter is unchanged in position. No pneumothorax is noted. Right lung is clear. Stable left basilar atelectasis is noted with probable associated pleural effusion. Left-sided chest tube has been removed. Bony thorax is unremarkable. IMPRESSION: No pneumothorax status post left-sided chest tube removal. Stable left basilar atelectasis with associated pleural effusion. Electronically Signed   By: Marijo Conception M.D.   On: 10/30/2020 08:34   DG Chest Port 1 View  Result Date: 10/29/2020 CLINICAL DATA:  Status post CABG EXAM: PORTABLE CHEST 1 VIEW COMPARISON:  10/28/2020 FINDINGS: Status post median sternotomy and CABG. Interval removal of previously noted endotracheal and nasogastric tubes. Unchanged position of right IJ catheter, with tip approximating the low SVC. Bilateral chest tubes. Unchanged enlarged cardiac and mediastinal contours. Slightly increased interstitial opacities in the left lower lung, with a small left pleural effusion. Bibasilar atelectasis. Imaged abdomen is unremarkable. No acute osseous abnormality. IMPRESSION: 1. Interval removal of endotracheal and nasogastric tube. Otherwise stable support apparatus. 2. Increased interstitial opacities in the left lower lung, likely pulmonary edema, with a small pleural effusion. Electronically Signed   By: Merilyn Baba MD   On: 10/29/2020 09:02   DG Chest Port 1 View  Result Date: 10/28/2020 CLINICAL DATA:  Status post coronary bypass graft. EXAM: PORTABLE CHEST 1 VIEW COMPARISON:  October 26, 2020. FINDINGS: Endotracheal and nasogastric tubes are in grossly good position. Right internal jugular catheter is noted with tip in expected position of the SVC. Left-sided chest tube is noted without pneumothorax. Mild bibasilar subsegmental atelectasis is noted. Bony thorax is unremarkable. IMPRESSION: Swore apparatus in grossly good position. Mild bibasilar subsegmental atelectasis is noted. No pneumothorax is noted. Electronically Signed   By: Marijo Conception M.D.   On: 10/28/2020 15:27   VAS [place] LOWER EXT SAPHENOUS VEIN MAPPING  Result Date: 10/26/2020 LOWER EXTREMITY VEIN MAPPING Patient Name:  Miguel Fletcher  Date of Exam:   10/26/2020 Medical Rec #: [id number]          Accession #:    [id number] Date of Birth: 16-Feb-1956          Patient Gender: M Patient Age:   65 years Exam Location:  [facility] Procedure:      VAS [place] LOWER EXTREMITY  SAPHENOUS VEIN MAPPING Referring Phys: Remo Lipps HENDRICKSON --------------------------------------------------------------------------------  Indications:  Pre-op Risk Factors: Hypertension, current smoker, coronary artery disease.  Comparison Study: No prior studies. Performing Technologist: Oliver Hum RVT  Examination Guidelines: A complete evaluation includes B-mode imaging, spectral Doppler, color Doppler, and power Doppler as needed of all accessible portions of each vessel. Bilateral testing is considered an integral part of a complete examination. Limited examinations for reoccurring indications may be performed as noted. +---------------+-----------+----------------------+---------------+-----------+   RT Diameter  RT Findings         GSV            LT Diameter  LT Findings      (cm)                                            (cm)                  +---------------+-----------+----------------------+---------------+-----------+      0.57  Saphenofemoral                                                                Junction                                  +---------------+-----------+----------------------+---------------+-----------+      0.59       branching     Proximal thigh                               +---------------+-----------+----------------------+---------------+-----------+      0.46       branching       Mid thigh                                  +---------------+-----------+----------------------+---------------+-----------+      0.43                      Distal thigh                                +---------------+-----------+----------------------+---------------+-----------+      0.33                          Knee                                    +---------------+-----------+----------------------+---------------+-----------+      0.33       branching       Prox calf                                   +---------------+-----------+----------------------+---------------+-----------+      0.41       branching        Mid calf                                  +---------------+-----------+----------------------+---------------+-----------+      0.35                      Distal calf                                 +---------------+-----------+----------------------+---------------+-----------+ Diagnosing physician: Monica Martinez MD Electronically signed by Monica Martinez MD on 10/26/2020 at 4:36:17 PM.    Final    ECHO INTRAOPERATIVE TEE  Result Date: 10/28/2020  *INTRAOPERATIVE TRANSESOPHAGEAL REPORT *  Patient Name:   Miguel Fletcher Date of Exam: 10/28/2020 Medical Rec #:  [id number]         Height:       68.0 in Accession #:    [id number]        Weight:       214.9 lb Date of  Birth:  March 10, 1956         BSA:          2.11 m Patient Age:    82 years          BP:           165/67 mmHg Patient Gender: M                 HR:           60 bpm. Exam Location:  Anesthesiology Transesophogeal exam was perform intraoperatively during surgical procedure. Patient was closely monitored under general anesthesia during the entirety of examination. Indications:     Coronary Artery Disease Sonographer:     Bernadene Person RDCS Performing Phys: Arnold Diagnosing Phys: Hoy Morn MD Complications: No known complications during this procedure. POST-OP IMPRESSIONS _ Left Ventricle: The left ventricle is unchanged from pre-bypass. _ Right Ventricle: The right ventricle appears unchanged from pre-bypass. _ Aorta: The aorta appears unchanged from pre-bypass. _ Left Atrium: The left atrium appears unchanged from pre-bypass. _ Left Atrial Appendage: The left atrial appendage appears unchanged from pre-bypass. _ Aortic Valve: The aortic valve appears unchanged from pre-bypass. _ Mitral Valve: The mitral valve appears unchanged from pre-bypass. _ Tricuspid Valve: The tricuspid valve appears unchanged  from pre-bypass. _ Pulmonic Valve: The pulmonic valve appears unchanged from pre-bypass. _ Interatrial Septum: The interatrial septum appears unchanged from pre-bypass. _ Interventricular Septum: The interventricular septum appears unchanged from pre-bypass. _ Pericardium: The pericardium appears unchanged from pre-bypass. _ Comments: Post-bypass images reviewed with surgeon. PRE-OP FINDINGS  Left Ventricle: The left ventricle has normal systolic function, with an ejection fraction of 60-65%. The cavity size was normal. There is severe concentric left ventricular hypertrophy. Right Ventricle: The right ventricle has normal systolic function. The cavity was normal. There is no increase in right ventricular wall thickness. Left Atrium: Left atrial size was dilated. No left atrial/left atrial appendage thrombus was detected. Left atrial appendage velocity is normal at greater than 40 cm/s. Right Atrium: Right atrial size was normal in size. Interatrial Septum: No atrial level shunt detected by color flow Doppler. Pericardium: Trivial pericardial effusion is present. Mitral Valve: The mitral valve is normal in structure. Mitral valve regurgitation is trivial by color flow Doppler. Tricuspid Valve: The tricuspid valve was normal in structure. Tricuspid valve regurgitation was not visualized by color flow Doppler. Aortic Valve: The aortic valve is tricuspid Aortic valve regurgitation was not visualized by color flow Doppler. Pulmonic Valve: The pulmonic valve was normal in structure. Pulmonic valve regurgitation is not visualized by color flow Doppler. Aorta: There is evidence of plaque in the ascending aorta, descending aorta and aortic arch. Pulmonary Artery: The pulmonary artery is of normal size.  Hoy Morn MD Electronically signed by Hoy Morn MD Signature Date/Time: 10/28/2020/3:55:46 PM    Final    VAS US DOPPLER PRE CABG  Result Date: 10/26/2020 PREOPERATIVE VASCULAR EVALUATION Patient Name:  Miguel Fletcher  Date of Exam:   10/26/2020 Medical Rec #: [id number]          Accession #:    [id number] Date of Birth: 1955/10/02          Patient Gender: M Patient Age:   9 years Exam Location:  [facility] Procedure:      VAS US DOPPLER PRE CABG Referring Phys: Remo Lipps HENDRICKSON --------------------------------------------------------------------------------  Indications:      Pre-CABG. Risk Factors:     Hypertension, current smoker, coronary artery  disease. Comparison Study: No prior studies. Performing Technologist: Carlos Levering RVT  Examination Guidelines: A complete evaluation includes B-mode imaging, spectral Doppler, color Doppler, and power Doppler as needed of all accessible portions of each vessel. Bilateral testing is considered an integral part of a complete examination. Limited examinations for reoccurring indications may be performed as noted.  Right Carotid Findings: +----------+--------+--------+--------+-----------------------+--------+           PSV cm/sEDV cm/sStenosisDescribe               Comments +----------+--------+--------+--------+-----------------------+--------+ CCA Prox  126     15              smooth and heterogenoustortuous +----------+--------+--------+--------+-----------------------+--------+ CCA Distal71      15              smooth and heterogenous         +----------+--------+--------+--------+-----------------------+--------+ ICA Prox  96      20              smooth and heterogenous         +----------+--------+--------+--------+-----------------------+--------+ ICA Distal81      29                                     tortuous +----------+--------+--------+--------+-----------------------+--------+ ECA       171                                                     +----------+--------+--------+--------+-----------------------+--------+ +----------+--------+-------+--------+------------+           PSV cm/sEDV cmsDescribeArm Pressure  +----------+--------+-------+--------+------------+ Subclavian251                                 +----------+--------+-------+--------+------------+ +---------+--------+--+--------+--+---------+ VertebralPSV cm/s58EDV cm/s21Antegrade +---------+--------+--+--------+--+---------+ Left Carotid Findings: +----------+--------+--------+--------+-----------------------+--------+           PSV cm/sEDV cm/sStenosisDescribe               Comments +----------+--------+--------+--------+-----------------------+--------+ CCA Prox  117     15              smooth and heterogenous         +----------+--------+--------+--------+-----------------------+--------+ CCA Distal73      13              smooth and heterogenous         +----------+--------+--------+--------+-----------------------+--------+ ICA Prox  120     29              smooth and heterogenous         +----------+--------+--------+--------+-----------------------+--------+ ICA Distal82      31                                     tortuous +----------+--------+--------+--------+-----------------------+--------+ ECA       260     16                                              +----------+--------+--------+--------+-----------------------+--------+ +----------+--------+--------+--------+------------+ SubclavianPSV cm/sEDV cm/sDescribeArm Pressure +----------+--------+--------+--------+------------+  232                                  +----------+--------+--------+--------+------------+ +---------+--------+---+--------+--+---------+ VertebralPSV cm/s137EDV cm/s39Antegrade +---------+--------+---+--------+--+---------+  ABI Findings: +--------+------------------+-----+---------+--------+ Right   Rt Pressure (mmHg)IndexWaveform Comment  +--------+------------------+-----+---------+--------+ [id number]                    triphasic          +--------+------------------+-----+---------+--------+ PTA     113               0.70 biphasic          +--------+------------------+-----+---------+--------+ DP      102               0.63 biphasic          +--------+------------------+-----+---------+--------+ +--------+------------------+-----+----------+-------+ Left    Lt Pressure (mmHg)IndexWaveform  Comment +--------+------------------+-----+----------+-------+ [id number]                    triphasic         +--------+------------------+-----+----------+-------+ PTA     119               0.74 monophasic        +--------+------------------+-----+----------+-------+ DP      124               0.77 monophasic        +--------+------------------+-----+----------+-------+ +-------+---------------+----------------+ ABI/TBIToday's ABI/TBIPrevious ABI/TBI +-------+---------------+----------------+ Right  0.7                             +-------+---------------+----------------+ Left   0.77                            +-------+---------------+----------------+  Right Doppler Findings: +--------+--------+-----+---------+--------+ Site    PressureIndexDoppler  Comments +--------+--------+-----+---------+--------+ [id number]          triphasic         +--------+--------+-----+---------+--------+ Radial               triphasic         +--------+--------+-----+---------+--------+ Ulnar                triphasic         +--------+--------+-----+---------+--------+  Left Doppler Findings: +--------+--------+-----+---------+--------+ Site    PressureIndexDoppler  Comments +--------+--------+-----+---------+--------+ [id number]          triphasic         +--------+--------+-----+---------+--------+ Radial               triphasic         +--------+--------+-----+---------+--------+ Ulnar                triphasic         +--------+--------+-----+---------+--------+  Summary: Right  Carotid: Velocities in the right ICA are consistent with a 1-39% stenosis. Left Carotid: Velocities in the left ICA are consistent with a 1-39% stenosis. Vertebrals: Bilateral vertebral arteries demonstrate antegrade flow. Right ABI: Resting right ankle-brachial index indicates moderate right lower extremity arterial disease. Left ABI: Resting left ankle-brachial index indicates moderate left lower extremity arterial disease. Right Upper Extremity: Doppler waveform obliterate with right radial compression. Doppler waveforms remain within normal limits with right ulnar compression. Left Upper Extremity: Doppler waveform obliterate with left radial compression. Doppler waveforms remain within normal limits with left ulnar compression.  Electronically signed by Monica Martinez MD on 10/26/2020 at  4:36:49 PM.    Final    ECHOCARDIOGRAM LIMITED  Result Date: 11/12/2020    ECHOCARDIOGRAM LIMITED REPORT   Patient Name:   Miguel Fletcher Date of Exam: 11/12/2020 Medical Rec #:  [id number]         Height:       68.0 in Accession #:    [id number]        Weight:       199.3 lb Date of Birth:  April 23, 1955         BSA:          2.041 m Patient Age:    79 years          BP:           151/74 mmHg Patient Gender: M                 HR:           72 bpm. Exam Location:  [facility] Procedure: Limited Echo, Cardiac Doppler and Color Doppler Indications:     Dyspnea R06.00  History:         Patient has prior history of Echocardiogram examinations, most                  recent 10/28/2020. Prior CABG, Stroke; Risk                  Factors:Hypertension. Tobacco use.  Sonographer:     Sherrie Sport Referring Phys:  [id number] [place] Diagnosing Phys: Ida Rogue MD IMPRESSIONS  1. Left ventricular ejection fraction, by estimation, is 55%. The left ventricle has normal function. The left ventricle demonstrates regional wall motion abnormalities (septal wall motion consistent with postoperative state). There is severe left ventricular  hypertrophy.  2. Right ventricular systolic function is normal. The right ventricular size is normal. There is normal pulmonary artery systolic pressure. The estimated right ventricular systolic pressure is 0000000 mmHg.  3. Left atrial size was severely dilated.  4. The mitral valve is normal in structure. Mild mitral valve regurgitation. No evidence of mitral stenosis.  5. Large left pleural effusion estimated >7 cm, up to 9 cm in select images FINDINGS  Left Ventricle: Left ventricular ejection fraction, by estimation, is 55%. The left ventricle has normal function. The left ventricle demonstrates regional wall motion abnormalities. The left ventricular internal cavity size was normal in size. There is  severe left ventricular hypertrophy. Right Ventricle: The right ventricular size is normal. No increase in right ventricular wall thickness. Right ventricular systolic function is normal. There is normal pulmonary artery systolic pressure. The tricuspid regurgitant velocity is 2.44 m/s, and  with an assumed right atrial pressure of 5 mmHg, the estimated right ventricular systolic pressure is 0000000 mmHg. Left Atrium: Left atrial size was severely dilated. Right Atrium: Right atrial size was normal in size. Pericardium: Trivial pericardial effusion is present. Mitral Valve: The mitral valve is normal in structure. Mild mitral valve regurgitation. No evidence of mitral valve stenosis. Tricuspid Valve: The tricuspid valve is normal in structure. Tricuspid valve regurgitation is mild . No evidence of tricuspid stenosis. Aortic Valve: The aortic valve was not well visualized. Aortic valve regurgitation is not visualized. Mild aortic valve sclerosis is present, with no evidence of aortic valve stenosis. Pulmonic Valve: The pulmonic valve was normal in structure. Pulmonic valve regurgitation is not visualized. No evidence of pulmonic stenosis. Aorta: The aortic root is normal in size and structure. Venous: The inferior vena  cava is  normal in size with greater than 50% respiratory variability, suggesting right atrial pressure of 3 mmHg. IAS/Shunts: No atrial level shunt detected by color flow Doppler. LEFT VENTRICLE PLAX 2D LVIDd:         3.90 cm LVIDs:         2.80 cm LV PW:         1.60 cm LV IVS:        1.65 cm  RIGHT VENTRICLE RV Basal diam:  2.90 cm RV S prime:     10.10 cm/s TAPSE (M-mode): 4.0 cm LEFT ATRIUM              Index       RIGHT ATRIUM           Index LA diam:        3.80 cm  1.86 cm/m  RA Area:     24.50 cm LA Vol (A2C):   124.0 ml 60.76 ml/m RA Volume:   70.20 ml  34.40 ml/m LA Vol (A4C):   177.0 ml 86.74 ml/m LA Biplane Vol: 148.0 ml 72.52 ml/m                        PULMONIC VALVE AORTA                 PV Vmax:        1.72 m/s Ao Root diam: 3.40 cm PV Peak grad:   11.8 mmHg                       RVOT Peak grad: 17 mmHg  TRICUSPID VALVE TR Peak grad:   23.8 mmHg TR Vmax:        244.00 cm/s Ida Rogue MD Electronically signed by Ida Rogue MD Signature Date/Time: 11/12/2020/4:03:10 PM    Final    IR THORACENTESIS ASP PLEURAL SPACE W/IMG GUIDE  Result Date: 11/12/2020 INDICATION: Patient with left pleural effusion request received for diagnostic and therapeutic thoracentesis. EXAM: ULTRASOUND GUIDED LEFT THORACENTESIS MEDICATIONS: Local 1% lidocaine only. COMPLICATIONS: None immediate. PROCEDURE: An ultrasound guided thoracentesis was thoroughly discussed with the patient and questions answered. The benefits, risks, alternatives and complications were also discussed. The patient understands and wishes to proceed with the procedure. Written consent was obtained. Ultrasound was performed to localize and mark an adequate pocket of fluid in the left chest. The area was then prepped and draped in the normal sterile fashion. 1% Lidocaine was used for local anesthesia. Under ultrasound guidance a 19 gauge, 7-cm, Yueh catheter was introduced. Thoracentesis was performed. The catheter was removed and a dressing  applied. FINDINGS: A total of approximately 800 mL of blood-tinged fluid was removed. Samples were sent to the laboratory as requested by the clinical team. IMPRESSION: Successful ultrasound guided left thoracentesis yielding 800 mL of pleural fluid. Read By: Tsosie Billing PA-C Electronically Signed   By: Aletta Edouard M.D.   On: 11/12/2020 14:23   (Echo, Carotid, EGD, Colonoscopy, ERCP)    Subjective: Seen and examined on day of discharge.  No complaints  Discharge Exam: Vitals:   11/12/20 0804 11/12/20 1508  BP: (!) 151/74 118/68  Pulse: 72 68  Resp: 18 18  Temp: 98.5 F (36.9 C) 98.4 F (36.9 C)  SpO2: 98% 96%   Vitals:   11/12/20 0217 11/12/20 0501 11/12/20 0804 11/12/20 1508  BP:  (!) 157/74 (!) 151/74 118/68  Pulse:  68 72 68  Resp:  16 18 18  Temp:  99.1 F (37.3 C) 98.5 F (36.9 C) 98.4 F (36.9 C)  TempSrc:    Oral  SpO2:  96% 98% 96%  Weight: 90.4 kg     Height: '5\' 8"'$  (1.727 m)       General: Pt is alert, awake, not in acute distress Cardiovascular: RRR, S1/S2 +, no rubs, no gallops Respiratory: CTA bilaterally, no wheezing, no rhonchi Abdominal: Soft, NT, ND, bowel sounds + Extremities: no edema, no cyanosis    The results of significant diagnostics from this hospitalization (including imaging, microbiology, ancillary and laboratory) are listed below for reference.     Microbiology: Recent Results (from the past 240 hour(s))  SARS CORONAVIRUS 2 (TAT 6-24 HRS) Nasopharyngeal Nasopharyngeal Swab     Status: None   Collection Time: 11/11/20  5:29 PM   Specimen: Nasopharyngeal Swab  Result Value Ref Range Status   SARS Coronavirus 2 NEGATIVE NEGATIVE Final    Comment: Performed at [facility], [place] [address], [place], [place] [address]     Labs: BNP (last 3 results) Recent Labs    10/05/20 0811 10/22/20 1751 11/11/20 1646  BNP 130.9* 296.5* 0000000*   Basic Metabolic Panel: Recent Labs  Lab 11/09/20 1429 11/11/20 1339 11/12/20 0608   NA 140 140 140  K 4.2 3.8 3.7  CL 104 108 106  CO2 '24 24 25  '$ GLUCOSE 95 94 89  BUN '10 11 13  '$ CREATININE 0.99 0.90 0.85  CALCIUM 9.3 8.7* 8.7*   Liver Function Tests: Recent Labs  Lab 11/09/20 1429  AST 34  ALT 47*  ALKPHOS 121  BILITOT 0.6  PROT 6.7  ALBUMIN 3.5*   No results for input(s): LIPASE, AMYLASE in the last 168 hours. No results for input(s): AMMONIA in the last 168 hours. CBC: Recent Labs  Lab 11/09/20 1429 11/11/20 1339 11/12/20 0608  WBC 7.6 6.8 5.3  HGB 10.9* 10.6* 11.5*  HCT 32.7* 32.0* 33.7*  MCV 91 91.7 92.8  PLT 492* 498* 423*   Cardiac Enzymes: No results for input(s): CKTOTAL, CKMB, CKMBINDEX, TROPONINI in the last 168 hours. BNP: Invalid input(s): POCBNP CBG: No results for input(s): GLUCAP in the last 168 hours. D-Dimer No results for input(s): DDIMER in the last 72 hours. Hgb A1c No results for input(s): HGBA1C in the last 72 hours. Lipid Profile No results for input(s): CHOL, HDL, LDLCALC, TRIG, CHOLHDL, LDLDIRECT in the last 72 hours. Thyroid function studies No results for input(s): TSH, T4TOTAL, T3FREE, THYROIDAB in the last 72 hours.  Invalid input(s): FREET3 Anemia work up No results for input(s): VITAMINB12, FOLATE, FERRITIN, TIBC, IRON, RETICCTPCT in the last 72 hours. Urinalysis    Component Value Date/Time   COLORURINE AMBER (A) 10/26/2020 1513   APPEARANCEUR HAZY (A) 10/26/2020 1513   APPEARANCEUR Clear 04/22/2011 0951   LABSPEC 1.021 10/26/2020 1513   LABSPEC 1.004 04/22/2011 0951   PHURINE 5.0 10/26/2020 1513   GLUCOSEU NEGATIVE 10/26/2020 1513   GLUCOSEU Negative 04/22/2011 0951   HGBUR NEGATIVE 10/26/2020 1513   BILIRUBINUR NEGATIVE 10/26/2020 1513   BILIRUBINUR Negative 04/22/2011 [place] 10/26/2020 1513   PROTEINUR NEGATIVE 10/26/2020 1513   NITRITE NEGATIVE 10/26/2020 1513   LEUKOCYTESUR NEGATIVE 10/26/2020 1513   LEUKOCYTESUR Negative 04/22/2011 0951   Sepsis Labs Invalid input(s):  PROCALCITONIN,  WBC,  LACTICIDVEN Microbiology Recent Results (from the past 240 hour(s))  SARS CORONAVIRUS 2 (TAT 6-24 HRS) Nasopharyngeal Nasopharyngeal Swab     Status: None   Collection Time: 11/11/20  5:29 PM   Specimen: Nasopharyngeal Swab  Result Value Ref Range Status   SARS Coronavirus 2 NEGATIVE NEGATIVE Final    Comment: Performed at [facility], [place] [address] [place], [place] [address]     Time coordinating discharge: Over 30 minutes  SIGNED:   Sidney Ace, MD  [facility] 11/12/2020, 5:11 PM Pager   If 7PM-7AM, please contact night-coverage

## 2020-11-12 NOTE — Progress Notes (Signed)
*  PRELIMINARY RESULTS* Echocardiogram 2D Echocardiogram has been performed.  Sherrie Sport 11/12/2020, 10:54 AM

## 2020-11-12 NOTE — Plan of Care (Signed)

## 2020-11-12 NOTE — Consult Note (Signed)
Cardiology Consultation:   Patient ID: Miguel Fletcher MRN: [id number]; DOB: 1955/06/29  Admit date: 11/11/2020 Date of Consult: 11/12/2020  PCP:  [facility] [place] [facility] Cardiologist:  Ida Rogue, MD   {  Patient Profile:   Miguel Fletcher is a 65 y.o. 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 Priscella Mann.  History of Present Illness:   Miguel Fletcher is followed by Dr. Rockey Situ for the above cardiac issues. Presented to ED at [facility] October 01 2020 with right axially pain and elevated troponin. Echo showed EF 60-65% with G1DD. Symptoms were felt to be secondary to nerve sheath tumor however, int he setting of elevated troponin, admission was advised. He left AMA. He returned July 18 with fever, chills, malaise and chest pain. HE was found to be COVID positive and hypertensive. Troponin peaked at 144. He was admitted and treated for COVID. He underwent cardiac cath showed that showed severe multivessel CAD. It was felt he would require OP CT following COVID recovery. AT follow-up it was unsure what medications he was taking and coreg and statin resumed. He was referred to CT surgery. Upon initial evaluation he was markedly hypertensive with SBP 200s and required ER evaluation. Following stabilization he was admitted for CABG x5, which was performed October 28, 2020.   Seen in the office 11/09/20 and reported medication compliance. No anignal symptoms. Quit smoking. He was not interested in low dose lasix.   Presented to the ER [facility] with shortness of breath.HE took his medications that morning and then shortly after he started feeling very short of breath. Says he had some sob a couple days ago with trouble laying flat. It initially  improved so he did not go to th ER. Reports medication compliance. Reports started smoking  In the ER BP 125/56, afebrile, RR 16, HR 64bpm. Labs showed potassium 3.8, bicarb 24, BUN 11, Scr 0.9, BG 94, WBC 6.8, Hgb 10.6,  Trop 51>56>59. BNP 393. IV lasix '40mg'$  ordered.CXR with mod left pleural effusion. EKG shows SR with no new changes. Pt was admitted for further work-up.  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 08/2020 MRI [facility]): Well-circumscribed T1 hypointense, T2 hyperintense enhancing mass within the right axilla measuring 3.1 x 2.6 x 3.0 cm.  Mass extends along the course of the axillary neurovascular structures.  No evidence for invasion into the adjacent musculature.  No enlarged axillary lymph nodes.   Bell's palsy    Biliary colic    CAD (coronary artery disease)    a. 09/2020 NSTEMI/Cath: LM 60-70d, LAD 70p, D2 60, RI 70p, LCX ild diff dzs, RCA 40-8m RPDA 50, RPAV 50; b. 10/2020 CABG x 5 (LIMA->LAD, L radial->RI, VG->D1, VG->RPDA->RPL.   Diastolic dysfunction    a. 09/2020 Echo: EF 60-65%, no rwma, sev LVH. Gr1 DD. Nl RV size/fxn. Mod dil LA.   Hemorrhoids    Hypercholesterolemia    Hypertension    Stroke (cerebrum) (HCC)    Tobacco use     Past Surgical History:  Procedure Laterality Date   CORONARY ARTERY BYPASS GRAFT N/A 10/28/2020   Procedure: CORONARY ARTERY BYPASS GRAFTING (CABG), ON PUMP, TIMES FIVE, USING LEFT INTERNAL MAMMARY ARTERY, LEFT RADIAL ARTERY  AND ENDOSCOPICALLY HARVESTED RIGHT GREATER SAPHENOUS VEIN;  Surgeon: Melrose Nakayama, MD;  Location: [facility];  Service: Open Heart Surgery;  Laterality: N/A;   ENDOVEIN HARVEST OF GREATER SAPHENOUS VEIN Right 10/28/2020   Procedure: ENDOVEIN HARVEST OF RIGHT GREATER SAPHENOUS VEIN;  Surgeon: Melrose Nakayama, MD;  Location: [facility];  Service: Open Heart Surgery;  Laterality: Right;   INTRAVASCULAR ULTRASOUND/IVUS N/A 10/06/2020   Procedure: Intravascular Ultrasound/IVUS;   Surgeon: Sherren Mocha, MD;  Location: [facility];  Service: Cardiovascular;  Laterality: N/A;   LEFT HEART CATH AND CORONARY ANGIOGRAPHY N/A 10/06/2020   Procedure: LEFT HEART CATH AND CORONARY ANGIOGRAPHY;  Surgeon: Sherren Mocha, MD;  Location: [facility];  Service: Cardiovascular;  Laterality: N/A;   RADIAL ARTERY HARVEST Left 10/28/2020   Procedure: LEFT RADIAL ARTERY HARVEST;  Surgeon: Melrose Nakayama, MD;  Location: [facility];  Service: Open Heart Surgery;  Laterality: Left;   TEE WITHOUT CARDIOVERSION N/A 10/28/2020   Procedure: TRANSESOPHAGEAL ECHOCARDIOGRAM (TEE);  Surgeon: Melrose Nakayama, MD;  Location: [facility];  Service: Open Heart Surgery;  Laterality: N/A;   VARICOSE VEIN SURGERY Left      Home Medications:  Prior to Admission medications   Medication Sig Start Date End Date Taking? Authorizing Provider  amLODipine (NORVASC) 10 MG tablet Take 1 tablet (10 mg total) by mouth daily. 10/23/20 01/21/21 Yes Dunn, Areta Haber, PA-C  aspirin EC 325 MG EC tablet Take 1 tablet (325 mg total) by mouth daily. 11/01/20  Yes Lars Pinks M, PA-C  atorvastatin (LIPITOR) 80 MG tablet Take 1 tablet (80 mg total) by mouth daily at 8 pm. 11/02/20  Yes Barrett, Erin R, PA-C  carvedilol (COREG) 3.125 MG tablet Take 1 tablet (3.125 mg total) by mouth 2 (two) times daily with a meal. 11/09/20  Yes Theora Gianotti, NP  hydrALAZINE (APRESOLINE) 100 MG tablet Take 0.5 tablets (50 mg total) by mouth 3 (three) times daily. 10/23/20  Yes Dunn, Areta Haber, PA-C  isosorbide mononitrate (IMDUR) 60 MG 24 hr tablet Take 1 tablet (60 mg total) by mouth daily. 10/23/20  Yes Dunn, Areta Haber, PA-C  lisinopril (ZESTRIL) 40 MG tablet Take 1 tablet (40 mg total) by mouth daily. 10/23/20 01/21/21 Yes Dunn, Areta Haber, PA-C  oxyCODONE (OXY IR/ROXICODONE) 5 MG immediate release tablet Take 1-2 tablets (5-10 mg total) by mouth every 4 (four) hours as needed for severe pain. 11/02/20  Yes Barrett, Lodema Hong, PA-C     Inpatient Medications: Scheduled Meds:  amLODipine  10 mg Oral Daily   aspirin  325 mg Oral Daily   atorvastatin  80 mg Oral Q2000   carvedilol  3.125 mg Oral BID WC   furosemide  40 mg Intravenous Daily   hydrALAZINE  50 mg Oral TID   isosorbide mononitrate  60 mg Oral Daily   lisinopril  40 mg Oral Daily   Continuous Infusions:  PRN Meds: acetaminophen **OR** acetaminophen, ondansetron **OR** ondansetron (ZOFRAN) IV, oxyCODONE  Allergies:   No Known Allergies  Social History:   Social History   Socioeconomic History   Marital status: Single    Spouse name: Not on file   Number of children: Not on file   Years of education: Not on file   Highest education level: Not on file  Occupational History   Not on file  Tobacco Use   Smoking status: Some Days    Packs/day: 2.00    Years: 25.00    Pack years: 50.00  Types: Cigarettes    Last attempt to quit: 11/06/2020    Years since quitting: 0.0   Smokeless tobacco: Never   Tobacco comments:    "I have smoked 4 cigs this week."  Vaping Use   Vaping Use: Never used  Substance and Sexual Activity   Alcohol use: No   Drug use: No   Sexual activity: Not on file  Other Topics Concern   Not on file  Social History Narrative   Not on file   Social Determinants of Health   Financial Resource Strain: Not on file  Food Insecurity: Not on file  Transportation Needs: Not on file  Physical Activity: Not on file  Stress: Not on file  Social Connections: Not on file  Intimate Partner Violence: Not on file    Family History:    Family History  Problem Relation Age of Onset   Alzheimer's disease Father    Heart disease Father    CAD Neg Hx      ROS:  Please see the history of present illness.   All other ROS reviewed and negative.     Physical Exam/Data:   Vitals:   11/11/20 2210 11/12/20 0217 11/12/20 0501 11/12/20 0804  BP: (!) 160/64  (!) 157/74 (!) 151/74  Pulse: 75  68 72  Resp: '19  16 18  '$ Temp:  99.2 F (37.3 C)  99.1 F (37.3 C) 98.5 F (36.9 C)  TempSrc: Oral     SpO2: 95%  96% 98%  Weight:  90.4 kg    Height:  '5\' 8"'$  (1.727 m)      Intake/Output Summary (Last 24 hours) at 11/12/2020 0936 Last data filed at 11/12/2020 0826 Gross per 24 hour  Intake --  Output 600 ml  Net -600 ml   Last 3 Weights 11/12/2020 11/09/2020 11/02/2020  Weight (lbs) 199 lb 4.7 oz 207 lb 6 oz 211 lb 3.2 oz  Weight (kg) 90.4 kg 94.065 kg 95.8 kg     Body mass index is 30.3 kg/m.  General:  Well nourished, well developed, in no acute distress HEENT: normal Lymph: no adenopathy Neck: no JVD Endocrine:  No thryomegaly Vascular: No carotid bruits; FA pulses 2+ bilaterally without bruits  Cardiac:  normal S1, S2; RRR; no murmur  Lungs:  diminished left side  Abd: soft, nontender, no hepatomegaly  Ext: no edema Musculoskeletal:  No deformities, BUE and BLE strength normal and equal Skin: warm and dry  Neuro:  CNs 2-12 intact, no focal abnormalities noted Psych:  Normal affect   EKG:  The EKG was personally reviewed and demonstrates:  NSR, 62bpm, IVCD, possible LVH, TWI lateral leads  Telemetry:  Telemetry was personally reviewed and demonstrates:  N/A  Relevant CV Studies:  Echo TEE [id number] Complications: No known complications during this procedure.  POST-OP IMPRESSIONS  _ Left Ventricle: The left ventricle is unchanged from pre-bypass.  _ Right Ventricle: The right ventricle appears unchanged from pre-bypass.  _ Aorta: The aorta appears unchanged from pre-bypass.  _ Left Atrium: The left atrium appears unchanged from pre-bypass.  _ Left Atrial Appendage: The left atrial appendage appears unchanged from  pre-bypass.  _ Aortic Valve: The aortic valve appears unchanged from pre-bypass.  _ Mitral Valve: The mitral valve appears unchanged from pre-bypass.  _ Tricuspid Valve: The tricuspid valve appears unchanged from pre-bypass.  _ Pulmonic Valve: The pulmonic valve appears unchanged from  pre-bypass.  _ Interatrial Septum: The interatrial septum appears unchanged from  pre-bypass.  _ Interventricular  Septum: The interventricular septum appears unchanged  from  pre-bypass.  _ Pericardium: The pericardium appears unchanged from pre-bypass.  _ Comments: Post-bypass images reviewed with surgeon.   Echo 10/02/20  1. Left ventricular ejection fraction, by estimation, is 60 to 65%. The  left ventricle has normal function. The left ventricle has no regional  wall motion abnormalities. There is severe left ventricular hypertrophy.  Left ventricular diastolic parameters   are consistent with Grade I diastolic dysfunction (impaired relaxation).   2. Right ventricular systolic function is normal. The right ventricular  size is normal.   3. Left atrial size was moderately dilated.   LHC 10/06/20   Dist LM to Prox LAD lesion is 70% stenosed.   Ramus lesion is 70% stenosed.   2nd Diag lesion is 60% stenosed.   Mid RCA lesion is 40% stenosed.   RPDA lesion is 50% stenosed.   RPAV lesion is 50% stenosed.   1.  Moderately severe, hemodynamically significant distal left main disease extending into the proximal LAD, confirmed by intravascular ultrasound assessment with minimal lumen area approximately 4 mm. 2.  Moderately severe eccentric stenosis of the ramus intermedius branch 3.  Patent left circumflex with mild nonobstructive disease 4.  Patent RCA with mild to moderate mid vessel stenosis and moderate stenosis involving the PDA and PLA branch vessels. 5.  Systemic hypertension   The patient has surgical coronary anatomy.  His coronary disease is clearly significant but not critical and it would be reasonable to treat him with medical therapy, control his blood pressure, allow him to recover from [place], and arrange outpatient cardiac surgical consultation for consideration of CABG.    Laboratory Data:  High Sensitivity Troponin:   Recent Labs  Lab 10/22/20 1751 10/22/20 2108  11/11/20 1339 11/11/20 1512 11/11/20 1855  TROPONINIHS 56* 48* 51* 56* 59*     Chemistry Recent Labs  Lab 11/09/20 1429 11/11/20 1339 11/12/20 0608  NA 140 140 140  K 4.2 3.8 3.7  CL 104 108 106  CO2 '24 24 25  '$ GLUCOSE 95 94 89  BUN '10 11 13  '$ CREATININE 0.99 0.90 0.85  CALCIUM 9.3 8.7* 8.7*  GFRNONAA  --  >60 >60  ANIONGAP  --  8 9    Recent Labs  Lab 11/09/20 1429  PROT 6.7  ALBUMIN 3.5*  AST 34  ALT 47*  ALKPHOS 121  BILITOT 0.6   Hematology Recent Labs  Lab 11/09/20 1429 11/11/20 1339 11/12/20 0608  WBC 7.6 6.8 5.3  RBC 3.61* 3.49* 3.63*  HGB 10.9* 10.6* 11.5*  HCT 32.7* 32.0* 33.7*  MCV 91 91.7 92.8  MCH 30.2 30.4 31.7  MCHC 33.3 33.1 34.1  RDW 12.7 13.3 13.3  PLT 492* 498* 423*   BNP Recent Labs  Lab 11/11/20 1646  BNP 393.4*    DDimer No results for input(s): DDIMER in the last 168 hours.   Radiology/Studies:  DG Chest 2 View  Result Date: 11/11/2020 CLINICAL DATA:  Shortness of breath EXAM: CHEST - 2 VIEW COMPARISON:  11/02/2020 FINDINGS: Post CABG changes. Stable cardiomegaly. Moderate left pleural effusion increased in size from prior. Trace right pleural effusion, decreased from prior. Left basilar atelectasis. No pneumothorax. IMPRESSION: Moderate left pleural effusion, increased in size from prior. Electronically Signed   By: Davina Poke D.O.   On: 11/11/2020 14:19     Assessment and Plan:   Shortness of breath Mod Pleural effusion s/p recent CABG Acute HFpEF - Presented with SOB, BNP with 393, CXR  with moderate left pleural effusion, likely post-surgical - seen 8/22 in the office and low dose lasix offered but patient declined - s/p lasix IV 40 in the ED - thoracentesis ordered - limited echo with preserved EF - continue Coreg and ACEi - likely needs lasix '20mg'$  daily with close follow-up.   CAD s/p CABG x5 on August 12/2020 Elevated troponin - HS trop elevated with flat trend - No chest pain reported - EKG with no new  ischemic changes - Continue Aspirin, BB, statin, Imdur  HTN - continue PTA meds amlodipine '10mg'$  daily, Hydralazine '50mg'$  TID, Imdur '60mg'$  dialy, Coreg 3.'125mg'$  BID, lisinopril '40mg'$  daily  - reports compliance with medications  HLD - LDL 78 - continue atorvastatin '80mg'$  daily  Tobacco use - previously quit, now smoking again - cessation recommended   For questions or updates, please contact [facility] Please consult [email or web address] for contact info under    Signed, Tameyah Koch Ninfa Meeker, PA-C  11/12/2020 9:36 AM

## 2020-11-12 NOTE — Procedures (Signed)
PROCEDURE SUMMARY:  Successful US guided left thoracentesis. Yielded 800 mL of blood tinged fluid. Pt tolerated procedure well. No immediate complications.  Specimen was sent for labs. CXR ordered.  EBL < 5 mL  Rockney Ghee 11/12/2020 2:24 PM

## 2020-11-13 NOTE — Telephone Encounter (Signed)
Pt d/c yesterday 8/26 Seen by cardiologist Cadence Kathlen Mody, PA-C Appt schedule for October 24 with Dr. Rockey Situ  A/P: Respiratory distress/pleural effusion moderate size, recent CABG Postsurgical etiology, commonly seen after CABG Declined Lasix as outpatient August 22 Would continue IV Lasix, needs thoracentesis Lasix 20 mg daily as outpatient, has follow-up with CT surgery in several weeks time for reevaluation   Coronary disease with stable angina, status post CABG x5 Continue carvedilol, ACE inhibitor, add Lasix 20 daily, Continue aspirin, statin, Imdur,   Essential hypertension Would continue outpatient medications amlodipine '10mg'$  daily, Hydralazine '50mg'$  TID, Imdur '60mg'$  dialy, Coreg 3.'125mg'$  BID, lisinopril '40mg'$  daily  Add Lasix 20 daily   Hyperlipidemia On Lipitor 80 daily goal LDL less than 70   Smoker We have encouraged him to continue to work on weaning his cigarettes and smoking cessation. He will continue to work on this and does not want any assistance with chantix.

## 2020-11-16 LAB — BODY FLUID CULTURE W GRAM STAIN: Culture: NO GROWTH

## 2020-12-07 ENCOUNTER — Other Ambulatory Visit: Payer: Self-pay | Admitting: Thoracic Surgery (Cardiothoracic Vascular Surgery)

## 2020-12-07 DIAGNOSIS — Z951 Presence of aortocoronary bypass graft: Secondary | ICD-10-CM

## 2020-12-08 ENCOUNTER — Other Ambulatory Visit: Payer: Self-pay | Admitting: *Deleted

## 2020-12-08 ENCOUNTER — Ambulatory Visit: Payer: Self-pay | Admitting: Thoracic Surgery (Cardiothoracic Vascular Surgery)

## 2020-12-08 ENCOUNTER — Other Ambulatory Visit: Payer: Self-pay

## 2020-12-08 ENCOUNTER — Ambulatory Visit
Admission: RE | Admit: 2020-12-08 | Discharge: 2020-12-08 | Disposition: A | Discharge status: Home / Self Care | Payer: Self-pay | Source: Ambulatory Visit | Attending: Thoracic Surgery (Cardiothoracic Vascular Surgery) | Admitting: Thoracic Surgery (Cardiothoracic Vascular Surgery)

## 2020-12-08 ENCOUNTER — Ambulatory Visit (INDEPENDENT_AMBULATORY_CARE_PROVIDER_SITE_OTHER): Payer: Self-pay | Admitting: Physician Assistant

## 2020-12-08 VITALS — BP 123/66 | HR 67 | Resp 20 | Ht 68.0 in | Wt 203.0 lb

## 2020-12-08 DIAGNOSIS — Z951 Presence of aortocoronary bypass graft: Secondary | ICD-10-CM

## 2020-12-08 DIAGNOSIS — I25119 Atherosclerotic heart disease of native coronary artery with unspecified angina pectoris: Secondary | ICD-10-CM

## 2020-12-08 DIAGNOSIS — J90 Pleural effusion, not elsewhere classified: Secondary | ICD-10-CM

## 2020-12-08 NOTE — Progress Notes (Signed)
[address]       [place] [address]             [phone]      Miguel Fletcher is a 65 y.o. male patient underwent a CABG x 5 on 8/10 with Dr. Roxan Hockey. He did not have any significant complications in the hospital. He returns to the office today for his routine post-op appointment.   Today, he does endorse more shortness of breath. His does has some pedal edema with varicose veins, spider veins, and venous changes. He was seen by Cardiology in [place] and labs were ordered. He has f/u with them already scheduled.    No diagnosis found. 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 08/2020 MRI [facility]): Well-circumscribed T1 hypointense, T2 hyperintense enhancing mass within the right axilla measuring 3.1 x 2.6 x 3.0 cm.  Mass extends along the course of the axillary neurovascular structures.  No evidence for invasion into the adjacent musculature.  No enlarged axillary lymph nodes.   Bell's palsy    Biliary colic    CAD (coronary artery disease)    a. 09/2020 NSTEMI/Cath: LM 60-70d, LAD 70p, D2 60, RI 70p, LCX ild diff dzs, RCA 40-51m, RPDA 50, RPAV 50; b. 10/2020 CABG x 5 (LIMA->LAD, L radial->RI, VG->D1, VG->RPDA->RPL.   Diastolic dysfunction    a. 09/2020 Echo: EF 60-65%, no rwma, sev LVH. Gr1 DD. Nl RV size/fxn. Mod dil LA.   Hemorrhoids    Hypercholesterolemia    Hypertension    Stroke (cerebrum) (HCC)    Tobacco use    No past surgical history pertinent negatives on file. Scheduled Meds: Current Outpatient Medications on File Prior to Visit  Medication Sig Dispense Refill   amLODipine (NORVASC) 10 MG tablet Take 1 tablet (10 mg total) by mouth daily. 90 tablet 3   aspirin EC 325 MG EC tablet Take 1 tablet (325 mg total) by mouth daily. 30 tablet 0   atorvastatin (LIPITOR) 80 MG tablet Take 1 tablet (80 mg total) by mouth daily at 8 pm. 30 tablet 3   carvedilol (COREG) 3.125 MG tablet Take 1 tablet  (3.125 mg total) by mouth 2 (two) times daily with a meal. 60 tablet 1   furosemide (LASIX) 20 MG tablet Take 1 tablet (20 mg total) by mouth daily. 30 tablet 0   hydrALAZINE (APRESOLINE) 100 MG tablet Take 0.5 tablets (50 mg total) by mouth 3 (three) times daily. 90 tablet 1   isosorbide mononitrate (IMDUR) 60 MG 24 hr tablet Take 1 tablet (60 mg total) by mouth daily. 90 tablet 3   lisinopril (ZESTRIL) 40 MG tablet Take 1 tablet (40 mg total) by mouth daily. 90 tablet 3   oxyCODONE (OXY IR/ROXICODONE) 5 MG immediate release tablet Take 1-2 tablets (5-10 mg total) by mouth every 4 (four) hours as needed for severe pain. 30 tablet 0   No current facility-administered medications on file prior to visit.     No Known Allergies Active Problems:   * No active hospital problems. *  Vitals:   12/08/20 1212  BP: 123/66  Pulse: 67  Resp: 20  SpO2: 96%     Physical Exam:  Cor: RRR, 2/6 systolic murmur noted Pulm: CTA bilaterally, a little diminished in the LLL Abd: no tenderness Wound: incisions all healing wlel Ext: venous changes with varicosities  L > R. Numbness in feet bilaterally, warm to  touch. 1-2+ edema  Chest xray:  CLINICAL DATA:  Status post coronary bypass graft. Shortness of breath.   EXAM: CHEST - 2 VIEW   COMPARISON:  November 12, 2020.   FINDINGS: Stable cardiomediastinal silhouette. No pneumothorax is noted. Right lung is clear. Small left pleural effusion is noted with adjacent left basilar atelectasis. Bony thorax is unremarkable.   IMPRESSION: Small left pleural effusion is noted with adjacent left basilar subsegmental atelectasis.     Electronically Signed   By: Marijo Conception M.D.   On: 12/08/2020 12:29   Assessment & Plan  Doing well overall s/p CABG, ambulating with some SOB. No significant pain HTN-well controlled today. Continue current medication regimen Left pleural effusion with increasing SOB- will send him back to [facility] for  US-guided thoracentesis. Last time this was done he had 800cc drained. There might be half of that still in the pleural space. He is to continue his lasix Leg numbness, likely a circulation issue. He does have many varicosities. Left leg is worse than his right. He describes the numbness as "pins and needles" with return circulation when he moves his feet vigorously. Vascular surgery consult sent today  Plan: US-guided thoracentesis in [place]. He asked me to update Ignacia Bayley, NP so I will send him this note. We will need to see him back next week with a CXR to make sure this is resolved.   Elgie Collard 12/08/2020

## 2020-12-09 ENCOUNTER — Ambulatory Visit
Admission: RE | Admit: 2020-12-09 | Discharge: 2020-12-09 | Disposition: A | Discharge status: Home / Self Care | Payer: Self-pay | Source: Ambulatory Visit | Attending: Radiology | Admitting: Radiology

## 2020-12-09 ENCOUNTER — Ambulatory Visit
Admission: RE | Admit: 2020-12-09 | Discharge: 2020-12-09 | Disposition: A | Discharge status: Home / Self Care | Payer: Self-pay | Source: Ambulatory Visit | Attending: Thoracic Surgery (Cardiothoracic Vascular Surgery) | Admitting: Thoracic Surgery (Cardiothoracic Vascular Surgery)

## 2020-12-09 ENCOUNTER — Other Ambulatory Visit: Payer: Self-pay | Admitting: Radiology

## 2020-12-09 DIAGNOSIS — J90 Pleural effusion, not elsewhere classified: Secondary | ICD-10-CM | POA: Insufficient documentation

## 2020-12-09 DIAGNOSIS — Z9889 Other specified postprocedural states: Secondary | ICD-10-CM

## 2020-12-09 DIAGNOSIS — R0602 Shortness of breath: Secondary | ICD-10-CM | POA: Insufficient documentation

## 2020-12-09 NOTE — Procedures (Signed)
PROCEDURE SUMMARY:  Successful US guided left thoracentesis. Yielded 800 mL of clear yellow fluid. Pt tolerated procedure well. No immediate complications.  Specimen was not sent for labs. CXR ordered.  EBL < 5 mL  Tsosie Billing D PA-C 12/09/2020 2:02 PM

## 2020-12-15 ENCOUNTER — Other Ambulatory Visit: Payer: Self-pay | Admitting: Thoracic Surgery (Cardiothoracic Vascular Surgery)

## 2020-12-15 DIAGNOSIS — Z951 Presence of aortocoronary bypass graft: Secondary | ICD-10-CM

## 2020-12-17 ENCOUNTER — Other Ambulatory Visit: Payer: Self-pay

## 2020-12-17 ENCOUNTER — Ambulatory Visit
Admission: RE | Admit: 2020-12-17 | Discharge: 2020-12-17 | Disposition: A | Discharge status: Home / Self Care | Payer: Self-pay | Source: Ambulatory Visit | Attending: Thoracic Surgery (Cardiothoracic Vascular Surgery) | Admitting: Thoracic Surgery (Cardiothoracic Vascular Surgery)

## 2020-12-17 ENCOUNTER — Ambulatory Visit (INDEPENDENT_AMBULATORY_CARE_PROVIDER_SITE_OTHER): Payer: Self-pay | Admitting: Surgical

## 2020-12-17 VITALS — BP 168/76 | HR 70 | Resp 20 | Ht 68.0 in | Wt 203.0 lb

## 2020-12-17 DIAGNOSIS — Z951 Presence of aortocoronary bypass graft: Secondary | ICD-10-CM

## 2020-12-17 DIAGNOSIS — I25119 Atherosclerotic heart disease of native coronary artery with unspecified angina pectoris: Secondary | ICD-10-CM

## 2020-12-17 DIAGNOSIS — J90 Pleural effusion, not elsewhere classified: Secondary | ICD-10-CM

## 2020-12-17 NOTE — Progress Notes (Signed)
Miguel Fletcher       [place] [address]             [phone]      Miguel Fletcher [place] Medical Record #[id number] Date of Birth: October 12, 1955  Referring: Berge, Christopher Rosa Sanchez, [place] Primary Cardiologist: Ida Rogue, MD   Chief Complaint:   POST OP FOLLOW UP     DATE OF PROCEDURE: 10/28/2020   PREOPERATIVE DIAGNOSIS:  Left main and 3-vessel coronary artery disease.   POSTOPERATIVE DIAGNOSIS:  Left main and 3-vessel coronary artery disease.   PROCEDURES PERFORMED:   Median sternotomy, extracorporeal circulation, Coronary artery bypass grafting x5 Left internal mammary artery to LAD, Left radial artery to ramus intermedius, Saphenous vein graft to first diagonal, Sequential saphenous vein  graft to posterior descending and posterolateral Endoscopic vein harvest right thigh, Open left radial harvest.   SURGEON: Modesto Charon, MD   ASSISTANTS:  Enid Cutter, PA and Jadene Pierini, [place]   ANESTHESIA:  General. History of Present Illness:    The patient is a 65 year old male status post the above described procedure seen in the office in postsurgical follow-up.  He was last seen on 12/08/2020 at which time he was having some symptoms of shortness of breath and lower extremity edema.  He has subsequently undergone a thoracentesis on the left with a approximately 800 mL.  He states that he feels significantly better and is more active.  He denies significant shortness of breath or dyspnea on exertion.  He is not having any further lower extremity edema.  He does report some dysuria.      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 08/2020 MRI [facility]): Well-circumscribed T1 hypointense, T2 hyperintense enhancing mass within the right axilla measuring 3.1 x 2.6 x 3.0 cm.  Mass extends along the course of the axillary neurovascular structures.  No evidence for  invasion into the adjacent musculature.  No enlarged axillary lymph nodes.   Bell's palsy    Biliary colic    CAD (coronary artery disease)    a. 09/2020 NSTEMI/Cath: LM 60-70d, LAD 70p, D2 60, RI 70p, LCX ild diff dzs, RCA 40-65m, RPDA 50, RPAV 50; b. 10/2020 CABG x 5 (LIMA->LAD, L radial->RI, VG->D1, VG->RPDA->RPL.   Diastolic dysfunction    a. 09/2020 Echo: EF 60-65%, no rwma, sev LVH. Gr1 DD. Nl RV size/fxn. Mod dil LA.   Hemorrhoids    Hypercholesterolemia    Hypertension    Stroke (cerebrum) (HCC)    Tobacco use      Social History   Tobacco Use  Smoking Status Some Days   Packs/day: 2.00   Years: 25.00   Pack years: 50.00   Types: Cigarettes   Last attempt to quit: 11/06/2020   Years since quitting: 0.1  Smokeless Tobacco Never  Tobacco Comments   "I have smoked 4 cigs this week."    Social History   Substance and Sexual Activity  Alcohol Use No     No Known Allergies  Current Outpatient Medications  Medication Sig Dispense Refill   amLODipine (NORVASC) 10 MG tablet Take 1 tablet (10 mg total) by mouth daily. 90 tablet 3   aspirin EC 325 MG EC tablet Take 1 tablet (325 mg total) by mouth daily. 30 tablet 0   atorvastatin (LIPITOR) 80 MG tablet Take 1 tablet (80 mg total) by mouth daily at 8 pm. 30  tablet 3   carvedilol (COREG) 3.125 MG tablet Take 1 tablet (3.125 mg total) by mouth 2 (two) times daily with a meal. 60 tablet 1   hydrALAZINE (APRESOLINE) 100 MG tablet Take 0.5 tablets (50 mg total) by mouth 3 (three) times daily. 90 tablet 1   isosorbide mononitrate (IMDUR) 60 MG 24 hr tablet Take 1 tablet (60 mg total) by mouth daily. 90 tablet 3   lisinopril (ZESTRIL) 40 MG tablet Take 1 tablet (40 mg total) by mouth daily. 90 tablet 3   oxyCODONE (OXY IR/ROXICODONE) 5 MG immediate release tablet Take 1-2 tablets (5-10 mg total) by mouth every 4 (four) hours as needed for severe pain. 30 tablet 0   furosemide (LASIX) 20 MG tablet Take 1 tablet (20 mg total) by mouth  daily. 30 tablet 0   No current facility-administered medications for this visit.       Physical Exam: BP (!) 168/76   Pulse 70   Resp 20   Ht 5\' 8"  (1.727 m)   Wt 203 lb (92.1 kg)   SpO2 97% Comment: RA  BMI 30.87 kg/m   General appearance: alert, cooperative, and no distress Heart: regular rate and rhythm Lungs: clear to auscultation bilaterally Abdomen: Benign Extremities: No edema Wound: Incisions well-healed without evidence of infection, radial harvest on left with some radial neuropathy.  Motor and strength are normal but there is some gross sensory decrease.   Diagnostic Studies & Laboratory data:     Recent Radiology Findings:   No results found.    Recent Lab Findings: Lab Results  Component Value Date   WBC 5.3 11/12/2020   HGB 11.5 (L) 11/12/2020   HCT 33.7 (L) 11/12/2020   PLT 423 (H) 11/12/2020   GLUCOSE 89 11/12/2020   CHOL 72 (L) 11/09/2020   TRIG 87 11/09/2020   HDL 21 (L) 11/09/2020   LDLDIRECT 30 11/09/2020   LDLCALC 33 11/09/2020   ALT 47 (H) 11/09/2020   AST 34 11/09/2020   NA 140 11/12/2020   K 3.7 11/12/2020   CL 106 11/12/2020   CREATININE 0.85 11/12/2020   BUN 13 11/12/2020   CO2 25 11/12/2020   INR 1.5 (H) 10/28/2020   HGBA1C 5.3 10/26/2020      Assessment / Plan: Patient had a good result from his thoracentesis and is currently feeling generally well and no longer endorsing shortness of breath.  In terms of his dysuria asked him to see his primary where they can do a further evaluation with urinalysis and cultures.  He is okay with this plan.  I did not make any changes to his current medication regimen and he will continue to follow with cardiology on regular intervals as they require.  We will see again on a as needed basis for any surgically related issues or at request.  We did discuss activity progression and restrictions.  He does report that he is already been driving.  His blood pressure is noted to be elevated but he has  not taken any of his daily meds prior to appointment because he states they make him feel drowsy.      Medication Changes: No orders of the defined types were placed in this encounter.     John Giovanni PA-C 12/17/2020 1:32 PM

## 2020-12-17 NOTE — Patient Instructions (Signed)
Activity progression as discussed. ?

## 2020-12-25 ENCOUNTER — Other Ambulatory Visit (INDEPENDENT_AMBULATORY_CARE_PROVIDER_SITE_OTHER): Payer: Self-pay | Admitting: Nurse Practitioner

## 2020-12-25 DIAGNOSIS — R200 Anesthesia of skin: Secondary | ICD-10-CM

## 2020-12-25 DIAGNOSIS — M79606 Pain in leg, unspecified: Secondary | ICD-10-CM

## 2020-12-28 ENCOUNTER — Encounter (INDEPENDENT_AMBULATORY_CARE_PROVIDER_SITE_OTHER): Payer: Self-pay | Admitting: Vascular Surgery

## 2020-12-28 ENCOUNTER — Encounter (INDEPENDENT_AMBULATORY_CARE_PROVIDER_SITE_OTHER): Payer: Self-pay

## 2020-12-29 ENCOUNTER — Encounter (INDEPENDENT_AMBULATORY_CARE_PROVIDER_SITE_OTHER): Payer: Self-pay | Admitting: Vascular Surgery

## 2021-01-04 ENCOUNTER — Encounter: Payer: Self-pay | Admitting: General Surgery

## 2021-01-11 ENCOUNTER — Encounter: Payer: Self-pay | Admitting: Cardiovascular Disease

## 2021-01-11 ENCOUNTER — Ambulatory Visit (INDEPENDENT_AMBULATORY_CARE_PROVIDER_SITE_OTHER): Payer: Self-pay | Admitting: Cardiovascular Disease

## 2021-01-11 ENCOUNTER — Other Ambulatory Visit: Payer: Self-pay

## 2021-01-11 VITALS — BP 140/62 | HR 69 | Ht 69.0 in | Wt 204.0 lb

## 2021-01-11 DIAGNOSIS — Z951 Presence of aortocoronary bypass graft: Secondary | ICD-10-CM

## 2021-01-11 DIAGNOSIS — E782 Mixed hyperlipidemia: Secondary | ICD-10-CM

## 2021-01-11 DIAGNOSIS — I10 Essential (primary) hypertension: Secondary | ICD-10-CM

## 2021-01-11 DIAGNOSIS — I25118 Atherosclerotic heart disease of native coronary artery with other forms of angina pectoris: Secondary | ICD-10-CM

## 2021-01-11 DIAGNOSIS — Z720 Tobacco use: Secondary | ICD-10-CM

## 2021-01-11 NOTE — Progress Notes (Signed)
Cardiology Office Note  Date:  01/11/2021   ID:  GASPARE NETZEL, DOB 1956/03/21, MRN [id number]  PCP:  [facility]   Chief Complaint  Patient presents with   2 month follow up     Patient c/o when coughing his chest hurts, indigestion, chest pain x 1 week and has shortness of breath. Medications reviewed by the patient verbally.     HPI:  65 y.o. male with medical history significant for  CAD status post CABG in August 2022,  tobacco abuse,  hyperlipidemia,  hypertension,  Who presents to establish care in the [place] office for coronary artery disease CABG  Discussed recent events Underwent bypass surgery Left main and 3-vessel coronary artery disease. Coronary artery bypass grafting x5 Left internal mammary artery to LAD, Left radial artery to ramus intermedius, Saphenous vein graft to first diagonal, Sequential saphenous vein  graft to posterior descending and posterolateral Open left radial harvest.  In recovery required thoracentesis x2 Admitted to the hospital for shortness of breath November 12, 2020  moderate left-sided pleural effusion and symptoms consistent with acute decompensated heart failure with preserved ejection fraction.   S/p thora w 800cc blood tinged fluid.   Was prescribed lasix 20mg  daily on DC.    Has been relatively sedentary since surgery Significant problems with left forearm pain at site of surgical radial artery donor location.  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  PSH:    Past Surgical History:  Procedure Laterality Date   CORONARY ARTERY BYPASS GRAFT N/A 10/28/2020   Procedure: CORONARY ARTERY BYPASS GRAFTING (CABG), ON PUMP, TIMES FIVE, USING LEFT INTERNAL MAMMARY ARTERY, LEFT RADIAL ARTERY AND ENDOSCOPICALLY HARVESTED RIGHT GREATER SAPHENOUS VEIN;  Surgeon: Melrose Nakayama, MD;  Location: [facility];  Service: Open Heart Surgery;  Laterality: N/A;   ENDOVEIN HARVEST OF GREATER SAPHENOUS VEIN Right 10/28/2020   Procedure: ENDOVEIN HARVEST OF RIGHT GREATER SAPHENOUS VEIN;  Surgeon: Melrose Nakayama, MD;  Location: [facility];  Service: Open Heart Surgery;  Laterality: Right;   INTRAVASCULAR ULTRASOUND/IVUS N/A 10/06/2020   Procedure: Intravascular Ultrasound/IVUS;  Surgeon: Sherren Mocha, MD;  Location: [facility];  Service: Cardiovascular;  Laterality: N/A;   IR THORACENTESIS ASP PLEURAL SPACE W/IMG GUIDE  11/12/2020   LEFT HEART CATH AND CORONARY ANGIOGRAPHY N/A 10/06/2020   Procedure: LEFT HEART CATH AND CORONARY ANGIOGRAPHY;  Surgeon: Sherren Mocha, MD;  Location: [facility];  Service: Cardiovascular;  Laterality: N/A;   RADIAL ARTERY HARVEST Left 10/28/2020   Procedure: LEFT RADIAL ARTERY HARVEST;  Surgeon: Melrose Nakayama, MD;  Location: [facility];  Service: Open Heart Surgery;  Laterality: Left;   TEE WITHOUT CARDIOVERSION N/A 10/28/2020   Procedure: TRANSESOPHAGEAL ECHOCARDIOGRAM (TEE);  Surgeon: Melrose Nakayama, MD;  [facility];  Service: Open Heart Surgery;  Laterality: N/A;   VARICOSE VEIN SURGERY Left     Current Outpatient Medications  Medication Sig Dispense Refill   amLODipine (NORVASC) 10 MG tablet Take 1 tablet (10  mg total) by mouth daily. 90 tablet 3   aspirin EC 325 MG EC tablet Take 1 tablet (325 mg total) by mouth daily. 30 tablet 0   atorvastatin (LIPITOR) 80 MG tablet Take 1 tablet (80 mg total) by mouth daily at 8 pm. 30 tablet 3   carvedilol (COREG) 3.125 MG tablet Take 1 tablet (3.125 mg total) by mouth 2 (two) times daily with a meal. 60 tablet 1   furosemide (LASIX) 20 MG tablet Take 1 tablet (20 mg total) by mouth daily. 30 tablet 0   hydrALAZINE (APRESOLINE) 100 MG tablet Take 0.5 tablets (50 mg total) by mouth 3 (three) times daily. 90 tablet 1   isosorbide mononitrate (IMDUR) 60 MG 24 hr tablet Take 1 tablet (60 mg total) by mouth daily. 90 tablet 3   lisinopril (ZESTRIL) 40 MG tablet Take 1 tablet (40 mg total) by mouth daily. 90 tablet 3   oxyCODONE (OXY IR/ROXICODONE) 5 MG immediate release tablet Take 1-2 tablets (5-10 mg total) by mouth every 4 (four) hours as needed for severe pain. 30 tablet 0   No current facility-administered medications for this visit.    Allergies:   Patient has no known allergies.   Social History:  The patient  reports that he has been smoking cigarettes. He has a 50.00 pack-year smoking history. He has never used smokeless tobacco. He reports that he does not drink alcohol and does not use drugs.   Family History:   family history includes Alzheimer's disease in his father; Heart disease in his father.    Review of Systems: Review of Systems  Constitutional: Negative.   HENT: Negative.    Respiratory: Negative.    Cardiovascular:  Positive for chest pain.  Gastrointestinal: Negative.   Musculoskeletal: Negative.        Left forearm pain and numbness, left hand numbness  Neurological: Negative.   Psychiatric/Behavioral: Negative.    All other systems reviewed and are negative.   PHYSICAL EXAM: VS:  BP 140/62 (BP Location: Left Arm, Patient Position: Sitting, Cuff Size: Normal)   Pulse 69   Ht 5\' 9"  (1.753 m)   Wt 204 lb (92.5 kg)   SpO2 99%    BMI 30.13 kg/m  , BMI Body mass index is 30.13 kg/m. GEN: Well nourished, well developed, in no acute distress HEENT: normal Neck: no JVD, carotid bruits, or masses Cardiac: RRR; 2/6 LSB no rubs, or gallops,no edema  Respiratory:  clear to auscultation bilaterally, normal work of breathing GI: soft, nontender, nondistended, + BS MS: no deformity or atrophy Skin: warm and dry, no rash Neuro:  Strength and sensation are intact Psych: euthymic mood, full affect   Recent Labs: 10/29/2020: Magnesium 2.9 11/09/2020: ALT 47 11/11/2020: B Natriuretic Peptide 393.4 11/12/2020: BUN 13; Creatinine, Ser 0.85; Hemoglobin 11.5; Platelets 423; Potassium 3.7; Sodium 140    Lipid Panel Lab Results  Component Value Date   CHOL 72 (L) 11/09/2020   HDL 21 (L) 11/09/2020   LDLCALC 33 11/09/2020   TRIG 87 11/09/2020      Wt Readings from Last 3 Encounters:  01/11/21 204 lb (92.5 kg)  12/17/20 203 lb (92.1 kg)  12/08/20 203 lb (92.1 kg)       ASSESSMENT AND PLAN:  Problem List  Items Addressed This Visit       Cardiology Problems   HTN (hypertension)   HLD (hyperlipidemia)   CAD (coronary artery disease) - Primary   Relevant Orders   EKG 12-Lead     Other   Tobacco use   S/P CABG x 5   Coronary artery disease with stable angina Recent CABG discussed with him Residual pain and deficits as detailed above Unable to go back to work, welding heavy metal Cholesterol at goal, recommend he continue to work on smoking cessation Gradual exercise program as tolerated  Smoker We have encouraged him to continue to work on weaning his cigarettes and smoking cessation. He will continue to work on this and does not want any assistance with chantix.    Hyperlipidemia Cholesterol at goal  Essential hypertension Blood pressure is well controlled on today's visit. No changes made to the medications.  Forearm pain, numbness Consistent with nerve damage after radial artery was removed, likely  with some scar tissue, permanent nerve injury Unable to grip items very well, unable to weld as he used to  candidate for disability   Total encounter time more than 35 minutes  Greater than 50% was spent in counseling and coordination of care with the patient    Signed, Esmond Plants, M.D., Ph.D. [place], [place]

## 2021-01-11 NOTE — Patient Instructions (Addendum)
Medication Instructions:  No changes  If you need a refill on your cardiac medications before your next appointment, please call your pharmacy.    Lab work: No new labs needed   Testing/Procedures: No new testing needed   Follow-Up: At [facility], you and your health needs are our priority.  As part of our continuing mission to provide you with exceptional heart care, we have created designated Provider Care Teams.  These Care Teams include your primary Cardiologist (physician) and Advanced Practice Providers (APPs -  Physician Assistants and Nurse Practitioners) who all work together to provide you with the care you need, when you need it.  You will need a follow up appointment in 6 months  Providers on your designated Care Team:   Christopher Berge, NP Ryan Dunn, PA-C Cadence Furth, PA-C  COVID-19 Vaccine Information can be found at: [email or web address] For questions related to vaccine distribution or appointments, please email [email or web address] or call [phone].   

## 2021-01-29 ENCOUNTER — Telehealth: Payer: Self-pay

## 2021-01-29 NOTE — Telephone Encounter (Signed)
Recieved request from [place]   Pending processing awaiting patient call back.   Placed in forms book

## 2021-02-01 NOTE — Telephone Encounter (Signed)
Took form o Advertising copywriter

## 2021-02-05 NOTE — Telephone Encounter (Signed)
After review disability forms that were fax over, much of forms cannot be completed accurately by cardiologist.  Pt claim for disability does not fall under cardiology.  The disability forms are in regards to pt's decrease hand grips.  Per Dr. 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  Reach out to [employer] who faxed forms to speak to representative Anda Kraft, she advised pt will need to contact them directly and will update his claim as the cardiologist office cannot complete form, will need PCP or neurosurgery at [facility] to complete.   Was able to reach out to Mr. Torosyan, advised Dr. Rockey Situ and Emeline Gins office unable to complete disability forms, not of questions on forms no related to cardiology, also reason for his disability for weakness to right side and for decrease hand grips is not followed by our office, pt came to [place] July this year, this condition was prior to first OV with [place]. Will need PCP, [facility] neurosurgery, or per pt "[facility] as they are the ones who mess this up during surgery" to complete the forms accuracy. Pt verbalized understanding, also advised to reach out to [place] per representative Anda Kraft. Again pt verbalized understanding and is thankful or the updated call.   Folder handed to Ray and Georgiana Spinner will reach out to pt to have his deposit returned to him.

## 2021-02-15 ENCOUNTER — Encounter (INDEPENDENT_AMBULATORY_CARE_PROVIDER_SITE_OTHER): Payer: Self-pay

## 2021-02-15 ENCOUNTER — Encounter (INDEPENDENT_AMBULATORY_CARE_PROVIDER_SITE_OTHER): Payer: Self-pay | Admitting: Vascular Surgery

## 2021-03-29 ENCOUNTER — Encounter (INDEPENDENT_AMBULATORY_CARE_PROVIDER_SITE_OTHER): Payer: Self-pay | Admitting: Vascular Surgery

## 2021-03-29 ENCOUNTER — Encounter (INDEPENDENT_AMBULATORY_CARE_PROVIDER_SITE_OTHER): Payer: Self-pay

## 2021-05-13 ENCOUNTER — Emergency Department
Admission: EM | Admit: 2021-05-13 | Discharge: 2021-05-13 | Disposition: A | Discharge status: Home / Self Care | Payer: Self-pay | Attending: Emergency Medicine | Admitting: Emergency Medicine

## 2021-05-13 ENCOUNTER — Other Ambulatory Visit: Payer: Self-pay

## 2021-05-13 ENCOUNTER — Encounter: Payer: Self-pay | Admitting: Emergency Medicine

## 2021-05-13 ENCOUNTER — Emergency Department: Payer: Self-pay

## 2021-05-13 DIAGNOSIS — R42 Dizziness and giddiness: Secondary | ICD-10-CM | POA: Insufficient documentation

## 2021-05-13 DIAGNOSIS — G510 Bell's palsy: Secondary | ICD-10-CM | POA: Insufficient documentation

## 2021-05-13 DIAGNOSIS — I110 Hypertensive heart disease with heart failure: Secondary | ICD-10-CM | POA: Insufficient documentation

## 2021-05-13 DIAGNOSIS — E876 Hypokalemia: Secondary | ICD-10-CM | POA: Insufficient documentation

## 2021-05-13 DIAGNOSIS — I509 Heart failure, unspecified: Secondary | ICD-10-CM | POA: Insufficient documentation

## 2021-05-13 DIAGNOSIS — I2510 Atherosclerotic heart disease of native coronary artery without angina pectoris: Secondary | ICD-10-CM | POA: Insufficient documentation

## 2021-05-13 DIAGNOSIS — Z951 Presence of aortocoronary bypass graft: Secondary | ICD-10-CM | POA: Insufficient documentation

## 2021-05-13 DIAGNOSIS — R778 Other specified abnormalities of plasma proteins: Secondary | ICD-10-CM | POA: Insufficient documentation

## 2021-05-13 DIAGNOSIS — Z8616 Personal history of COVID-19: Secondary | ICD-10-CM | POA: Insufficient documentation

## 2021-05-13 LAB — COMPREHENSIVE METABOLIC PANEL
ALT: 12 U/L (ref 0–44)
AST: 18 U/L (ref 15–41)
Albumin: 3.6 g/dL (ref 3.5–5.0)
Alkaline Phosphatase: 70 U/L (ref 38–126)
Anion gap: 7 (ref 5–15)
BUN: 17 mg/dL (ref 8–23)
CO2: 29 mmol/L (ref 22–32)
Calcium: 8.8 mg/dL — ABNORMAL LOW (ref 8.9–10.3)
Chloride: 103 mmol/L (ref 98–111)
Creatinine, Ser: 1.14 mg/dL (ref 0.61–1.24)
GFR, Estimated: >60 mL/min (ref >60)
Glucose, Bld: 101 mg/dL — ABNORMAL HIGH (ref 70–99)
Potassium: 2.8 mmol/L — ABNORMAL LOW (ref 3.5–5.1)
Sodium: 139 mmol/L (ref 135–145)
Total Bilirubin: 0.8 mg/dL (ref 0.3–1.2)
Total Protein: 7.5 g/dL (ref 6.5–8.1)

## 2021-05-13 LAB — DIFFERENTIAL
Abs Immature Granulocytes: 0.01 10*3/uL (ref 0.00–0.07)
Basophils Absolute: 0 10*3/uL (ref 0.0–0.1)
Basophils Relative: 1 %
Eosinophils Absolute: 0.3 10*3/uL (ref 0.0–0.5)
Eosinophils Relative: 6 %
Immature Granulocytes: 0 %
Lymphocytes Relative: 22 %
Lymphs Abs: 1.2 10*3/uL (ref 0.7–4.0)
Monocytes Absolute: 0.6 10*3/uL (ref 0.1–1.0)
Monocytes Relative: 11 %
Neutro Abs: 3.3 10*3/uL (ref 1.7–7.7)
Neutrophils Relative %: 60 %

## 2021-05-13 LAB — CBC
HCT: 43.2 % (ref 39.0–52.0)
Hemoglobin: 14 g/dL (ref 13.0–17.0)
MCH: 28.7 pg (ref 26.0–34.0)
MCHC: 32.4 g/dL (ref 30.0–36.0)
MCV: 88.7 fL (ref 80.0–100.0)
Platelets: 241 10*3/uL (ref 150–400)
RBC: 4.87 MIL/uL (ref 4.22–5.81)
RDW: 14.8 % (ref 11.5–15.5)
WBC: 5.6 10*3/uL (ref 4.0–10.5)
nRBC: 0 % (ref 0.0–0.2)

## 2021-05-13 LAB — MAGNESIUM: Magnesium: 2 mg/dL (ref 1.7–2.4)

## 2021-05-13 LAB — TROPONIN I (HIGH SENSITIVITY)
Troponin I (High Sensitivity): 62 ng/L — ABNORMAL HIGH (ref <18)
Troponin I (High Sensitivity): 51 ng/L — ABNORMAL HIGH (ref <18)

## 2021-05-13 MED ORDER — CARVEDILOL 6.25 MG PO TABS
3.1250 mg | ORAL_TABLET | Freq: Once | ORAL | Status: AC
  Administered 2021-05-13: 3.125 mg via ORAL
  Filled 2021-05-13: qty 1

## 2021-05-13 MED ORDER — FUROSEMIDE 20 MG PO TABS: 20.0000 mg | ORAL_TABLET | Freq: Every day | ORAL | 0 refills | Status: DC

## 2021-05-13 MED ORDER — LISINOPRIL 10 MG PO TABS
40.0000 mg | ORAL_TABLET | Freq: Once | ORAL | Status: AC
  Administered 2021-05-13: 40 mg via ORAL
  Filled 2021-05-13: qty 4

## 2021-05-13 MED ORDER — ASPIRIN 325 MG PO TBEC: 325.0000 mg | DELAYED_RELEASE_TABLET | Freq: Every day | ORAL | 0 refills | Status: DC

## 2021-05-13 MED ORDER — HYDRALAZINE HCL 50 MG PO TABS
50.0000 mg | ORAL_TABLET | Freq: Once | ORAL | Status: AC
  Administered 2021-05-13: 50 mg via ORAL
  Filled 2021-05-13: qty 1

## 2021-05-13 MED ORDER — AMLODIPINE BESYLATE 10 MG PO TABS: 10.0000 mg | ORAL_TABLET | Freq: Every day | ORAL | 3 refills | Status: DC

## 2021-05-13 MED ORDER — HYDRALAZINE HCL 100 MG PO TABS: 50.0000 mg | ORAL_TABLET | Freq: Three times a day (TID) | ORAL | 1 refills | Status: DC

## 2021-05-13 MED ORDER — POTASSIUM CHLORIDE CRYS ER 20 MEQ PO TBCR: 20.0000 meq | EXTENDED_RELEASE_TABLET | Freq: Two times a day (BID) | ORAL | 0 refills | Status: DC

## 2021-05-13 MED ORDER — ASPIRIN 81 MG PO CHEW
324.0000 mg | CHEWABLE_TABLET | Freq: Once | ORAL | Status: AC
  Administered 2021-05-13: 324 mg via ORAL
  Filled 2021-05-13: qty 4

## 2021-05-13 MED ORDER — ATORVASTATIN CALCIUM 80 MG PO TABS: 80.0000 mg | ORAL_TABLET | Freq: Every day | ORAL | 3 refills | Status: DC

## 2021-05-13 MED ORDER — POTASSIUM CHLORIDE CRYS ER 20 MEQ PO TBCR
40.0000 meq | EXTENDED_RELEASE_TABLET | Freq: Once | ORAL | Status: AC
  Administered 2021-05-13: 40 meq via ORAL
  Filled 2021-05-13: qty 2

## 2021-05-13 MED ORDER — ISOSORBIDE MONONITRATE ER 60 MG PO TB24: 60.0000 mg | ORAL_TABLET | Freq: Every day | ORAL | 3 refills | Status: DC

## 2021-05-13 MED ORDER — SODIUM CHLORIDE 0.9% FLUSH: 3.0000 mL | Freq: Once | INTRAVENOUS | Status: DC

## 2021-05-13 MED ORDER — CARVEDILOL 3.125 MG PO TABS: 3.1250 mg | ORAL_TABLET | Freq: Two times a day (BID) | ORAL | 1 refills | Status: DC

## 2021-05-13 MED ORDER — ISOSORBIDE MONONITRATE ER 60 MG PO TB24
60.0000 mg | ORAL_TABLET | Freq: Every day | ORAL | Status: DC
  Administered 2021-05-13: 60 mg via ORAL
  Filled 2021-05-13: qty 1

## 2021-05-13 MED ORDER — METOCLOPRAMIDE HCL 10 MG PO TABS
10.0000 mg | ORAL_TABLET | Freq: Once | ORAL | Status: AC
  Administered 2021-05-13: 10 mg via ORAL
  Filled 2021-05-13: qty 1

## 2021-05-13 MED ORDER — LISINOPRIL 40 MG PO TABS: 40.0000 mg | ORAL_TABLET | Freq: Every day | ORAL | 3 refills | Status: DC

## 2021-05-13 MED ORDER — ACETAMINOPHEN 500 MG PO TABS
1000.0000 mg | ORAL_TABLET | Freq: Once | ORAL | Status: AC
  Administered 2021-05-13: 1000 mg via ORAL
  Filled 2021-05-13: qty 2

## 2021-05-13 NOTE — ED Triage Notes (Addendum)
Pt here with with weakness that started a week ago. Pt states that his right side has been weaker than normal and swollen and he has been staggering more. Pt has slight facial droop. Pt states that last time he felt normal was a month ago. Pt states that lately when he gets up he get really dizzy and he has ear pain on the right side. Pt in NAD in triage.

## 2021-05-13 NOTE — Discharge Instructions (Addendum)
I have sent your prescription for your blood pressure, statin and aspirin to your pharmacy.  Please start taking these medications again.  Please follow-up with your primary care provider and your cardiologist.  Regarding your Bell's palsy, you can cover the right eye when you are sleeping an eye patch.  Please follow-up with neurology regarding chronic care of your facial weakness.  Potassium was slightly low today.  We gave you a supplement in the ER and you should take the potassium supplement for the next 5 days and have this rechecked in about 1 week.

## 2021-05-13 NOTE — ED Provider Notes (Signed)
[facility] Provider Note    Event Date/Time   First MD Initiated Contact with Patient 05/13/21 [phone]     (approximate)   History   Weakness   HPI  Miguel Fletcher is a 66 y.o. male with past medical history of Bell's palsy, coronary disease, heart failure with diastolic dysfunction, hypertension and hyperlipidemia who presents with dizziness and feeling generally unwell.  Patient notes that for the last 2 weeks he has been out of his blood pressure medicine not been taking any of his medicines.  He has lightheadedness when he stands up and when he bends over.  Denies any vertiginous symptoms.  He denies having passed out.  When he is still in sitting down does not have the symptoms.  Patient has chronic right-sided facial weakness from Bell's palsy this is unchanged.  Denies any new numbness tingling or weakness.  Denies any eye pain or visual changes.  Did feel like he had an ear ache earlier.  Patient also complaining of chronic chest pain and shortness of breath.  This has been going on really since his CABG.  Also complains of chronic headache since the CABG this is not new.  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 08/2020 MRI [facility]): Well-circumscribed T1 hypointense, T2 hyperintense enhancing mass within the right axilla measuring 3.1 x 2.6 x 3.0 cm.  Mass extends along the course of the axillary neurovascular structures.  No evidence for invasion into the adjacent musculature.  No enlarged axillary lymph nodes.   Bell's palsy    Biliary colic    CAD (coronary artery disease)    a. 09/2020 NSTEMI/Cath: LM 60-70d, LAD 70p, D2 60, RI 70p, LCX ild diff dzs, RCA 40-48m, RPDA 50, RPAV 50; b. 10/2020 CABG x 5 (LIMA->LAD, L radial->RI, VG->D1, VG->RPDA->RPL.   Diastolic dysfunction    a. 09/2020 Echo: EF 60-65%, no rwma, sev LVH. Gr1 DD. Nl RV size/fxn. Mod dil LA.   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6 C)     Temp Source 05/13/21 0723 Oral     SpO2 05/13/21 0723 97 %     Weight 05/13/21 0724 203 lb 14.8 oz (92.5 kg)     Height 05/13/21 0724 5\' 9"  (1.753 m)     Head Circumference --      Peak Flow --      Pain Score 05/13/21 0723 10     Pain Loc --      Pain Edu? --  Excl. in [place]? --     Most recent vital signs: Vitals:   05/13/21 0723 05/13/21 0900  BP: (!) 177/90 (!) 156/77  Pulse: 62 (!) 55  Resp: 18 18  Temp: 97.9 F (36.6 C)   SpO2: 97% 98%     General: Awake, no distress.  CV:  Good peripheral perfusion.  Resp:  Normal effort.  No increased work of breathing Abd:  No distention.  Soft and nontender throughout Neuro:             Awake, Alert, Oriented x 3  Other:   Right-sided facial droop involving the entire face upper and lower, unable to fully close the right eye PERRLA, EOMI  5/5 strength in bilateral upper and lower extremities, no pronator drift Normal finger-nose-finger bilateral upper extremities Nml gait, no ataxia    ED Results / Procedures / Treatments  Labs (all labs ordered are listed, but only abnormal results are displayed) Labs Reviewed   COMPREHENSIVE METABOLIC PANEL - Abnormal; Notable for the following components:      Result Value   Potassium 2.8 (*)    Glucose, Bld 101 (*)    Calcium 8.8 (*)    All other components within normal limits  TROPONIN I (HIGH SENSITIVITY) - Abnormal; Notable for the following components:   Troponin I (High Sensitivity) 62 (*)    All other components within normal limits  TROPONIN I (HIGH SENSITIVITY) - Abnormal; Notable for the following components:   Troponin I (High Sensitivity) 51 (*)    All other components within normal limits  CBC  DIFFERENTIAL  MAGNESIUM  CBG MONITORING, ED     EKG  EKG interpreted by myself, left axis deviation, LVH with QRS widening and repolarization abnormality, prolonged PR interval, similar in appearance to prior EKG   RADIOLOGY Chest x-ray reviewed by myself, cardiomegaly and small left pleural effusion   PROCEDURES:  Critical Care performed: No No .1-3 Lead EKG Interpretation Performed by: Rada Hay, MD Authorized by: Rada Hay, MD     Interpretation: normal     ECG rate assessment: normal     Rhythm: sinus rhythm     Ectopy: none     Conduction: normal    The patient is on the cardiac monitor to evaluate for evidence of arrhythmia and/or significant heart rate changes.   MEDICATIONS ORDERED IN ED: Medications  sodium chloride flush (NS) 0.9 % injection 3 mL (3 mLs Intravenous Not Given 05/13/21 0737)  isosorbide mononitrate (IMDUR) 24 hr tablet 60 mg (60 mg Oral Given 05/13/21 1005)  acetaminophen (TYLENOL) tablet 1,000 mg (1,000 mg Oral Given 05/13/21 0842)  metoCLOPramide (REGLAN) tablet 10 mg (10 mg Oral Given 05/13/21 0842)  potassium chloride SA (KLOR-CON M) CR tablet 40 mEq (40 mEq Oral Given 05/13/21 0842)  aspirin chewable tablet 324 mg (324 mg Oral Given 05/13/21 0844)  hydrALAZINE (APRESOLINE) tablet 50 mg (50 mg Oral Given 05/13/21 1006)  lisinopril (ZESTRIL) tablet 40 mg (40 mg Oral Given 05/13/21 1006)   carvedilol (COREG) tablet 3.125 mg (3.125 mg Oral Given 05/13/21 1005)     IMPRESSION / MDM / ASSESSMENT AND PLAN / ED COURSE  I reviewed the triage vital signs and the nursing notes.                              Differential diagnosis includes, but is not limited to, orthostatic hypotension, electrolyte abnormality, ACS, less likely CVA, cerebellar  stroke  Patient is a 66 year old male with a history of coronary disease status post CABG as well as a Bell's palsy with chronic right-sided facial droop who presents primarily for lightheadedness.  His symptoms sound to me most like orthostatic and that he is symptomatic when he stands up and when he bends over he denies any room spinning sensation, worsening symptoms with head movement or any symptoms at rest.  Patient has chronic right-sided facial droop, initially complaining of worsening weakness on that side but upon further questioning says that this is chronic for him.  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  This has been documented in the past.  Rest of his neurologic exam including gait and cerebellar testing is normal.  Patient has has also been out of his blood pressure medication for about 2 weeks.  He is hypertensive 177/90.  Patient complaining of chest pain which is chronic for him.  I reviewed his EKG which shows LVH with some QRS widening and repolarization abnormality similar to prior EKGs.  Patient's labs are notable for hypokalemia potassium 2.8, supplemented.  We will check a mag as well.  Plan to check troponin.    Troponin is elevated at 62, it has been chronically elevated in the past as well in the 50s.  On repeat assessment patient says that his chest pain is burning in sensation and is not exertional and he is not feeling this currently.  Do not feel that he is having active ischemia I suspect that this is demand in the setting of his hypertension.  His meds which  include hydralazine or lisinopril and Coreg which I will give him now and prescribe likely at the time of discharge.  Will need to repeat troponin as well.  His repeat troponin is 51.  No significant change.  Blood pressure improved after his home meds.  Will represcribe all of his home meds including statin and aspirin.  Will refer to neurology for chronic management of his facial pain and weakness related to the Bell's palsy.  I also discussed following up with both primary care at [facility] clinic as well as his cardiologist for his other chronic conditions.  Low suspicion for any emergent pathology at this time.  I think he is appropriate for discharge.  Prescribed 5 days of potassium supplement.      FINAL CLINICAL IMPRESSION(S) / ED DIAGNOSES   Final diagnoses:  Hypokalemia  Lightheadedness  Bell's palsy     Rx / DC Orders   ED Discharge Orders          Ordered    atorvastatin (LIPITOR) 80 MG tablet  Daily        05/13/21 1022    carvedilol (COREG) 3.125 MG tablet  2 times daily with meals        05/13/21 1022    hydrALAZINE (APRESOLINE) 100 MG tablet  3 times daily        05/13/21 1022    isosorbide mononitrate (IMDUR) 60 MG 24 hr tablet  Daily        05/13/21 1022    amLODipine (NORVASC) 10 MG tablet  Daily        05/13/21 1022    furosemide (LASIX) 20 MG tablet  Daily        05/13/21 1022    lisinopril (ZESTRIL) 40 MG tablet  Daily        05/13/21 1022    aspirin 325  MG EC tablet  Daily        05/13/21 1022    potassium chloride SA (KLOR-CON M) 20 MEQ tablet  2 times daily        05/13/21 1023             Note:  This document was prepared using Dragon voice recognition software and may include unintentional dictation errors.   Rada Hay, MD 05/13/21 1026

## 2021-06-08 ENCOUNTER — Encounter: Payer: Self-pay | Admitting: Cardiovascular Disease

## 2021-06-08 ENCOUNTER — Ambulatory Visit (INDEPENDENT_AMBULATORY_CARE_PROVIDER_SITE_OTHER): Payer: Self-pay | Admitting: Cardiovascular Disease

## 2021-06-08 ENCOUNTER — Other Ambulatory Visit: Payer: Self-pay

## 2021-06-08 VITALS — BP 160/80 | HR 76 | Ht 69.0 in | Wt 206.1 lb

## 2021-06-08 DIAGNOSIS — E782 Mixed hyperlipidemia: Secondary | ICD-10-CM

## 2021-06-08 DIAGNOSIS — I25118 Atherosclerotic heart disease of native coronary artery with other forms of angina pectoris: Secondary | ICD-10-CM

## 2021-06-08 DIAGNOSIS — Z951 Presence of aortocoronary bypass graft: Secondary | ICD-10-CM

## 2021-06-08 DIAGNOSIS — I10 Essential (primary) hypertension: Secondary | ICD-10-CM

## 2021-06-08 NOTE — Patient Instructions (Addendum)
Medication Instructions:  ?If blood pressure runs high,take extra coreg ?If still high, take extra 1/2 or whole of the isosorbide ? ? ?If you need a refill on your cardiac medications before your next appointment, please call your pharmacy.  ? ?Lab work: ?No new labs needed ? ?Testing/Procedures: ?No new testing needed ? ?Follow-Up: ?At [facility], you and your health needs are our priority.  As part of our continuing mission to provide you with exceptional heart care, we have created designated Provider Care Teams.  These Care Teams include your primary Cardiologist (physician) and Advanced Practice Providers (APPs -  Physician Assistants and Nurse Practitioners) who all work together to provide you with the care you need, when you need it. ? ?You will need a follow up appointment in 6 months ? ?Providers on your designated Care Team:   ?Murray Hodgkins, NP ?Christell Faith, PA-C ?Cadence Kathlen Mody, PA-C ? ?COVID-19 Vaccine Information can be found at: [email or web address] For questions related to vaccine distribution or appointments, please email [email or web address] or call [phone].  ? ?

## 2021-06-08 NOTE — Progress Notes (Signed)
Cardiology Office Note ? ?Date:  06/09/2021  ? ?ID:  Miguel Fletcher, DOB Nov 10, 1955, MRN [id number] ? ?PCP:  [facility]  ? ?Chief Complaint  ?Patient presents with  ? Follow up [facility]   ?  "Doing well." Medications reviewed by the patient verbally.   ? ? ?HPI:  ?66 y.o. male with medical history significant for  ?CAD status post CABG in August 2022,  ?tobacco abuse,  ?hyperlipidemia,  ?hypertension,  ?Who presents for f/u of her coronary artery disease CABG ? ?LOV 10/22 ? ?In follow-up he reports having Bell's palsy right side of his face ?Has had severe pain, dry eye, followed by ophthalmology ?Reports he might need eye surgery next week ? ?Continues to try to work, does welding ?Feels his blood pressure is elevated today from face and eye pain ?Typically is much better at home ? ?Denies any chest pain concerning for angina, no shortness of breath ? ?EKG personally reviewed by myself on todays visit ?Normal sinus rhythm rate 76 bpm left bundle branch block, no change from prior EKGs ? ?Other past medical history reviewed ?Underwent bypass surgery ?Left main and 3-vessel coronary artery disease. ?Coronary artery bypass grafting x5 ?Left internal mammary artery to LAD, ?Left radial artery to ramus intermedius, ?Saphenous vein graft to first diagonal, ?Sequential saphenous vein  graft to posterior descending and posterolateral ?Open left radial harvest. ? ?In recovery required thoracentesis x2 ?Admitted to the hospital for shortness of breath November 12, 2020 ? moderate left-sided pleural effusion and symptoms consistent with acute decompensated heart failure with preserved ejection fraction.   ?S/p thora w 800cc blood tinged fluid.   ?Was prescribed lasix '20mg'$  daily on DC.   ? ?Has been relatively sedentary since surgery ?Significant problems with left forearm pain at site of surgical radial artery donor location.  At that location has forearm pain, numbness, numbness in 3 of his fingers ? ?Reports  having a fall, hurt his chest, has pain on the right sternum, very tender ? 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 ?EKG personally reviewed by myself on todays visit ?Normal sinus rhythm rate 69 bpm LVH, unable to exclude old anterior MI ? ? 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 ? ?PSH:    ?Past Surgical History:  ?Procedure Laterality Date  ? CORONARY ARTERY BYPASS GRAFT N/A 10/28/2020  ? Procedure: CORONARY ARTERY BYPASS GRAFTING (CABG), ON PUMP, TIMES FIVE, USING LEFT INTERNAL MAMMARY ARTERY, LEFT RADIAL ARTERY AND ENDOSCOPICALLY HARVESTED RIGHT GREATER SAPHENOUS VEIN;  Surgeon: Melrose Nakayama, MD;  Location: [place];  Service: Open Heart Surgery;  Laterality: N/A;  ? ENDOVEIN HARVEST OF GREATER SAPHENOUS VEIN Right 10/28/2020  ? Procedure: ENDOVEIN HARVEST OF RIGHT GREATER SAPHENOUS VEIN;  Surgeon: Melrose Nakayama, MD;  Location: [place];  Service: Open Heart Surgery;  Laterality: Right;  ? INTRAVASCULAR ULTRASOUND/IVUS N/A 10/06/2020  ? Procedure: Intravascular Ultrasound/IVUS;  Surgeon: Sherren Mocha, MD;  Location: [place] [facility];  Service: Cardiovascular;  Laterality: N/A;  ? IR THORACENTESIS ASP PLEURAL SPACE W/IMG GUIDE  11/12/2020  ? LEFT HEART CATH AND CORONARY ANGIOGRAPHY N/A 10/06/2020  ? Procedure: LEFT  HEART CATH AND CORONARY ANGIOGRAPHY;  Surgeon: Sherren Mocha, MD;  Location: [place] [facility];  Service: Cardiovascular;  Laterality: N/A;  ? RADIAL ARTERY HARVEST Left 10/28/2020  ? Procedure: LEFT RADIAL ARTERY HARVEST;  Surgeon: Melrose Nakayama, MD;   Location: [place];  Service: Open Heart Surgery;  Laterality: Left;  ? TEE WITHOUT CARDIOVERSION N/A 10/28/2020  ? Procedure: TRANSESOPHAGEAL ECHOCARDIOGRAM (TEE);  Surgeon: Melrose Nakayama, MD;  Location: [place];  Service: Open Heart Surgery;  Laterality: N/A;  ? VARICOSE VEIN SURGERY Left   ? ? ?Current Outpatient Medications  ?Medication Sig Dispense Refill  ? amLODipine (NORVASC) 10 MG tablet Take 1 tablet (10 mg total) by mouth daily. 90 tablet 3  ? aspirin 81 MG EC tablet Take 81 mg by mouth daily. Swallow whole.    ? atorvastatin (LIPITOR) 80 MG tablet Take 1 tablet (80 mg total) by mouth daily at 8 pm. 30 tablet 3  ? carvedilol (COREG) 3.125 MG tablet Take 1 tablet (3.125 mg total) by mouth 2 (two) times daily with a meal. 60 tablet 1  ? furosemide (LASIX) 20 MG tablet Take 1 tablet (20 mg total) by mouth daily. 30 tablet 0  ? hydrALAZINE (APRESOLINE) 100 MG tablet Take 0.5 tablets (50 mg total) by mouth 3 (three) times daily. 90 tablet 1  ? isosorbide mononitrate (IMDUR) 60 MG 24 hr tablet Take 1 tablet (60 mg total) by mouth daily. 90 tablet 3  ? lisinopril (ZESTRIL) 40 MG tablet Take 1 tablet (40 mg total) by mouth daily. 90 tablet 3  ? oxyCODONE (OXY IR/ROXICODONE) 5 MG immediate release tablet Take 1-2 tablets (5-10 mg total) by mouth every 4 (four) hours as needed for severe pain. 30 tablet 0  ? potassium chloride SA (KLOR-CON M) 20 MEQ tablet Take 1 tablet (20 mEq total) by mouth 2 (two) times daily for 5 days. 10 tablet 0  ? ?No current facility-administered medications for this visit.  ? ? ?Allergies:   Patient has no known allergies.  ? ?Social History:  The patient  reports that he has been smoking cigarettes. He has a 50.00 pack-year smoking history. He has never used smokeless tobacco. He reports that he does not drink alcohol and does not use drugs.  ? ?Family History:   family history includes Alzheimer's disease in his father; Heart disease in his father.  ? ? ?Review of Systems: ?Review of  Systems  ?Constitutional: Negative.   ?     Facial drooping right side of face, bandage on right eye  ?HENT: Negative.    ?Respiratory: Negative.    ?Cardiovascular:  Positive for chest pain.  ?Gastrointestinal: Negative.   ?Musculoskeletal: Negative.   ?     Left forearm pain and numbness, left hand numbness  ?Neurological: Negative.   ?Psychiatric/Behavioral: Negative.    ?All other systems reviewed and are negative. ? ? ?PHYSICAL EXAM: ?VS:  BP (!) 160/80 (BP Location: Left Arm, Patient Position: Sitting, Cuff Size: Normal)   Pulse 76   Ht '5\' 9"'$  (1.753 m)   Wt 206 lb 2 oz (93.5 kg)   SpO2 98%   BMI 30.44 kg/m?  , BMI Body mass index is 30.44 kg/m?. ?GEN: Well nourished, well developed, in no acute distress ?HEENT: normal ?Neck: no JVD, carotid bruits, or masses ?Cardiac: RRR; 2/6 LSB no rubs, or gallops,no edema  ?Respiratory:  clear to auscultation bilaterally, normal work of breathing ?GI: soft, nontender, nondistended, + BS ?MS: no deformity or atrophy ?Skin:  warm and dry, no rash ?Neuro:  Strength and sensation are intact ?Psych: euthymic mood, full affect ? ? ?Recent Labs: ?11/11/2020: B Natriuretic Peptide 393.4 ?05/13/2021: ALT 12; BUN 17; Creatinine, Ser 1.14; Hemoglobin 14.0; Magnesium 2.0; Platelets 241; Potassium 2.8; Sodium 139  ? ? ?Lipid Panel ?Lab Results  ?Component Value Date  ? CHOL 72 (L) 11/09/2020  ? HDL 21 (L) 11/09/2020  ? [place] 33 11/09/2020  ? TRIG 87 11/09/2020  ? ?  ? ?Wt Readings from Last 3 Encounters:  ?06/08/21 206 lb 2 oz (93.5 kg)  ?05/13/21 203 lb 14.8 oz (92.5 kg)  ?01/11/21 204 lb (92.5 kg)  ?  ? ? ? ?ASSESSMENT AND PLAN: ? ?Problem List Items Addressed This Visit   ? ?  ? Cardiology Problems  ? HTN (hypertension)  ? Relevant Medications  ? aspirin 81 MG EC tablet  ? Other Relevant Orders  ? EKG 12-Lead  ? HLD (hyperlipidemia)  ? Relevant Medications  ? aspirin 81 MG EC tablet  ? CAD (coronary artery disease) - Primary  ? Relevant Medications  ? aspirin 81 MG EC tablet  ?  Other Relevant Orders  ? EKG 12-Lead  ?  ? Other  ? S/P CABG x 5  ? Relevant Orders  ? EKG 12-Lead  ? ?Bell's palsy ?Long discussion, has significant pain, followed by ophthalmology ?Reports he might need sur

## 2021-12-13 NOTE — Progress Notes (Unsigned)
Cardiology Office Note  Date:  12/14/2021   ID:  Miguel Fletcher, DOB [birth year], MRN [id number]  PCP:  Center, [facility]   Chief Complaint  Patient presents with   6 month follow up     Patient c/o shortness of breath, occasional edema left leg and pain in feet. Medications reviewed by the patient verbally.     HPI:  66 y.o. male with medical history significant for  CAD status post CABG in August 2022,  tobacco abuse, 1-2 ppd hyperlipidemia,  hypertension,  Who presents for f/u of her coronary artery disease CABG, HTN  LOV with myself March 2023 Today's visit reports he is only taking carvedilol Ran out of his medications  Seen in the emergency room [facility] October 23, 2021 hypertensive emergency "BP on arrival 240s/110s, pt asymptomatic and reported his BP normally runs that high. Rx'd lisinopril 20 mg daily, hydrochlorothiazide 25 mg daily, though pt ran out of BP meds last week, says he doesn't take meds regularly. Trop bump to 128, repeat 126."  In ED received: Hydral 10 mg IV, labetalol 10 mg IV + 10 mg PO, lisinopril 10 mg PO.  inability to afford med refills At discharge from the emergency room, hydrochlorothiazide and lasix held 2/2 hypokalemia, but continue additional home BP meds (amlodipine, coreg, lisinopril, imdur). Case manager provided pt with forms for [facility] and PAP prior to discharge.  Still works , metal work Trouble paying for some of his medications Close to the [place] clinic, now with Medicare  One of his biggest complaints is his foot pathology Peeling between the toes, no significant itching  EKG personally reviewed by myself on todays visit Normal sinus rhythm rate 68 bpm left bundle branch block, no change from prior EKGs  Other past medical history reviewed Underwent bypass surgery Left main and 3-vessel coronary artery disease. Coronary artery bypass grafting x5 Left internal mammary artery to LAD, Left radial artery to  ramus intermedius, Saphenous vein graft to first diagonal, Sequential saphenous vein  graft to posterior descending and posterolateral Open left radial harvest.  In recovery required thoracentesis x2 Admitted to the hospital for shortness of breath November 12, 2020  moderate left-sided pleural effusion and symptoms consistent with acute decompensated heart failure with preserved ejection fraction.   S/p thora w 800cc blood tinged fluid.   Was prescribed lasix '20mg'$  daily on DC.    Has been relatively sedentary since surgery Significant problems with left forearm pain at site of surgical radial artery donor location.  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  PSH:    Past Surgical History:  Procedure Laterality Date   CORONARY ARTERY BYPASS GRAFT N/A 10/28/2020   Procedure: CORONARY ARTERY BYPASS GRAFTING (CABG), ON PUMP, TIMES FIVE, USING LEFT INTERNAL MAMMARY ARTERY,  LEFT RADIAL ARTERY AND ENDOSCOPICALLY HARVESTED RIGHT GREATER SAPHENOUS VEIN;  Surgeon: Melrose Nakayama, MD;  Location: [place];  Service: Open Heart Surgery;  Laterality: N/A;   ENDOVEIN HARVEST OF GREATER SAPHENOUS VEIN Right 10/28/2020   Procedure: ENDOVEIN HARVEST OF RIGHT GREATER  SAPHENOUS VEIN;  Surgeon: Melrose Nakayama, MD;  Location: [place];  Service: Open Heart Surgery;  Laterality: Right;   INTRAVASCULAR ULTRASOUND/IVUS N/A 10/06/2020   Procedure: Intravascular Ultrasound/IVUS;  Surgeon: Sherren Mocha, MD;  Location: [place] CV LAB;  Service: Cardiovascular;  Laterality: N/A;   IR THORACENTESIS ASP PLEURAL SPACE W/IMG GUIDE  11/12/2020   LEFT HEART CATH AND CORONARY ANGIOGRAPHY N/A 10/06/2020   Procedure: LEFT HEART CATH AND CORONARY ANGIOGRAPHY;  Surgeon: Sherren Mocha, MD;  Location: [place] CV LAB;  Service: Cardiovascular;  Laterality: N/A;   RADIAL ARTERY HARVEST Left 10/28/2020   Procedure: LEFT RADIAL ARTERY HARVEST;  Surgeon: Melrose Nakayama, MD;  Location: [place];  Service: Open Heart Surgery;  Laterality: Left;   TEE WITHOUT CARDIOVERSION N/A 10/28/2020   Procedure: TRANSESOPHAGEAL ECHOCARDIOGRAM (TEE);  Surgeon: Melrose Nakayama, MD;  Location: [place];  Service: Open Heart Surgery;  Laterality: N/A;   VARICOSE VEIN SURGERY Left     Current Outpatient Medications  Medication Sig Dispense Refill   carvedilol (COREG) 3.125 MG tablet Take 1 tablet (3.125 mg total) by mouth 2 (two) times daily with a meal. 60 tablet 1   amLODipine (NORVASC) 10 MG tablet Take 1 tablet (10 mg total) by mouth daily. (Patient not taking: Reported on 12/14/2021) 90 tablet 3   aspirin 81 MG EC tablet Take 81 mg by mouth daily. Swallow whole. (Patient not taking: Reported on 12/14/2021)     atorvastatin (LIPITOR) 80 MG tablet Take 1 tablet (80 mg total) by mouth daily at 8 pm. (Patient not taking: Reported on 12/14/2021) 30 tablet 3   furosemide (LASIX) 20 MG tablet Take 1 tablet (20 mg total) by mouth daily. (Patient not taking: Reported on 12/14/2021) 30 tablet 0   hydrALAZINE (APRESOLINE) 100 MG tablet Take 0.5 tablets (50 mg total) by mouth 3 (three) times daily. (Patient not taking: Reported on 12/14/2021) 90 tablet 1   isosorbide mononitrate (IMDUR) 60 MG 24  hr tablet Take 1 tablet (60 mg total) by mouth daily. (Patient not taking: Reported on 12/14/2021) 90 tablet 3   lisinopril (ZESTRIL) 40 MG tablet Take 1 tablet (40 mg total) by mouth daily. (Patient not taking: Reported on 12/14/2021) 90 tablet 3   oxyCODONE (OXY IR/ROXICODONE) 5 MG immediate release tablet Take 1-2 tablets (5-10 mg total) by mouth every 4 (four) hours as needed for severe pain. (Patient not taking: Reported on 12/14/2021) 30 tablet 0   potassium chloride SA (KLOR-CON M) 20 MEQ tablet Take 1 tablet (20 mEq total) by mouth 2 (two) times daily for 5 days. (Patient not taking: Reported on 12/14/2021) 10 tablet 0   No current facility-administered medications for this visit.    Allergies:   Patient has no known allergies.   Social History:  The patient  reports that he has been smoking cigarettes. He has a 50.00 pack-year smoking history. He has never used smokeless tobacco. He reports that he does not drink alcohol and does not use drugs.   Family History:   family history includes Alzheimer's disease in his father; Heart disease in his father.    Review of Systems: Review of Systems  Constitutional: Negative.   HENT: Negative.  Respiratory: Negative.    Cardiovascular: Negative.   Gastrointestinal: Negative.   Musculoskeletal: Negative.   Skin:        Itching of his toes  Neurological: Negative.   Psychiatric/Behavioral: Negative.    All other systems reviewed and are negative.    PHYSICAL EXAM: VS:  BP (!) 170/100 (BP Location: Left Arm, Patient Position: Sitting, Cuff Size: Normal)   Pulse 68   Ht '5\' 9"'$  (1.753 m)   Wt 203 lb (92.1 kg)   SpO2 98%   BMI 29.98 kg/m  , BMI Body mass index is 29.98 kg/m. Constitutional:  oriented to person, place, and time. No distress.  HENT:  Head: Grossly normal Eyes:  no discharge. No scleral icterus.  Neck: No JVD, no carotid bruits  Cardiovascular: Regular rate and rhythm, no murmurs appreciated Pulmonary/Chest: Clear to  auscultation bilaterally, no wheezes or rails Abdominal: Soft.  no distension.  no tenderness.  Musculoskeletal: Normal range of motion Neurological:  normal muscle tone. Coordination normal. No atrophy Skin: Skin warm and dry Psychiatric: normal affect, pleasant  Recent Labs: 05/13/2021: ALT 12; BUN 17; Creatinine, Ser 1.14; Hemoglobin 14.0; Magnesium 2.0; Platelets 241; Potassium 2.8; Sodium 139    Lipid Panel Lab Results  Component Value Date   CHOL 72 (L) 11/09/2020   HDL 21 (L) 11/09/2020   LDLCALC 33 11/09/2020   TRIG 87 11/09/2020      Wt Readings from Last 3 Encounters:  12/14/21 203 lb (92.1 kg)  06/08/21 206 lb 2 oz (93.5 kg)  05/13/21 203 lb 14.8 oz (92.5 kg)     ASSESSMENT AND PLAN:  Problem List Items Addressed This Visit       Cardiology Problems   Essential hypertension (Chronic)   HTN (hypertension)   HLD (hyperlipidemia)   CAD (coronary artery disease) - Primary     Other   Tobacco use   S/P CABG x 5   Other Visit Diagnoses     Tobacco abuse         Bell's palsy Has made good recovery  Coronary artery disease with stable angina Smoking cessation recommended Medications refilled including Lipitor, aspirin  Smoker We have encouraged him to continue to work on weaning his cigarettes and smoking cessation. He will continue to work on this and does not want any assistance with chantix.  Still smoking 1 to 2 packs/day  Hyperlipidemia Recommend he stay on Lipitor 80 daily  Essential hypertension Blood pressure running high, medication noncompliance secondary to psychosocial issues Followed by [facility] clinic, refill sent in, recommend he pick up his medications tomorrow.  May be able to afford more now that he is on Medicare  Forearm pain, numbness Consistent with nerve damage after radial artery was removed, likely with some scar tissue, permanent nerve injury Continues to work in metal fabrication  Foot fungus Miconazole cream called in,  recommend he apply this twice daily  GERD Prescription for omeprazole 20 mg sent in   Total encounter time more than 30 minutes  Greater than 50% was spent in counseling and coordination of care with the patient    Signed, Esmond Plants, M.D., Ph.D. [place], [place]

## 2021-12-14 ENCOUNTER — Encounter: Payer: Self-pay | Admitting: Cardiovascular Disease

## 2021-12-14 ENCOUNTER — Ambulatory Visit: Payer: Medicare Other | Attending: Cardiovascular Disease | Admitting: Cardiovascular Disease

## 2021-12-14 VITALS — BP 170/100 | HR 68 | Ht 69.0 in | Wt 203.0 lb

## 2021-12-14 DIAGNOSIS — I25118 Atherosclerotic heart disease of native coronary artery with other forms of angina pectoris: Secondary | ICD-10-CM | POA: Insufficient documentation

## 2021-12-14 DIAGNOSIS — Z951 Presence of aortocoronary bypass graft: Secondary | ICD-10-CM | POA: Diagnosis not present

## 2021-12-14 DIAGNOSIS — I10 Essential (primary) hypertension: Secondary | ICD-10-CM | POA: Diagnosis not present

## 2021-12-14 DIAGNOSIS — E782 Mixed hyperlipidemia: Secondary | ICD-10-CM | POA: Diagnosis not present

## 2021-12-14 DIAGNOSIS — Z720 Tobacco use: Secondary | ICD-10-CM | POA: Diagnosis present

## 2021-12-14 MED ORDER — HYDRALAZINE HCL 100 MG PO TABS: 50.0000 mg | ORAL_TABLET | Freq: Three times a day (TID) | ORAL | 3 refills | Status: DC

## 2021-12-14 MED ORDER — OMEPRAZOLE 20 MG PO CPDR: 20.0000 mg | DELAYED_RELEASE_CAPSULE | Freq: Every day | ORAL | 11 refills | Status: DC

## 2021-12-14 MED ORDER — MICONAZOLE NITRATE 2 % EX CREA: 1.0000 | TOPICAL_CREAM | Freq: Two times a day (BID) | CUTANEOUS | 3 refills | Status: DC

## 2021-12-14 MED ORDER — ATORVASTATIN CALCIUM 80 MG PO TABS: 80.0000 mg | ORAL_TABLET | Freq: Every day | ORAL | 3 refills | Status: DC

## 2021-12-14 MED ORDER — ASPIRIN 81 MG PO TBEC: 81.0000 mg | DELAYED_RELEASE_TABLET | Freq: Every day | ORAL | 3 refills | Status: DC

## 2021-12-14 MED ORDER — LISINOPRIL 40 MG PO TABS: 40.0000 mg | ORAL_TABLET | Freq: Every day | ORAL | 3 refills | Status: DC

## 2021-12-14 MED ORDER — AMLODIPINE BESYLATE 10 MG PO TABS: 10.0000 mg | ORAL_TABLET | Freq: Every day | ORAL | 3 refills | Status: DC

## 2021-12-14 MED ORDER — FUROSEMIDE 20 MG PO TABS: 20.0000 mg | ORAL_TABLET | Freq: Every day | ORAL | 3 refills | Status: DC

## 2021-12-14 MED ORDER — ISOSORBIDE MONONITRATE ER 60 MG PO TB24: 60.0000 mg | ORAL_TABLET | Freq: Every day | ORAL | 3 refills | Status: DC

## 2021-12-14 MED ORDER — CARVEDILOL 3.125 MG PO TABS: 3.1250 mg | ORAL_TABLET | Freq: Two times a day (BID) | ORAL | 3 refills | Status: DC

## 2021-12-14 NOTE — Patient Instructions (Addendum)
Medication Instructions:  No changes Please start miconazole twice a day for feet Omeprazole once a day for stomach acid  If you need a refill on your cardiac medications before your next appointment, please call your pharmacy.   Lab work: No new labs needed  Testing/Procedures: No new testing needed  Follow-Up: At [facility], you and your health needs are our priority.  As part of our continuing mission to provide you with exceptional heart care, we have created designated Provider Care Teams.  These Care Teams include your primary Cardiologist (physician) and Advanced Practice Providers (APPs -  Physician Assistants and Nurse Practitioners) who all work together to provide you with the care you need, when you need it.  You will need a follow up appointment in 6 months  Providers on your designated Care Team:   Murray Hodgkins, NP Christell Faith, PA-C Cadence Kathlen Mody, [place]  COVID-19 Vaccine Information can be found at: [email or web address].uk For questions related to vaccine distribution or appointments, please email [email or web address] or call [phone].

## 2021-12-21 ENCOUNTER — Emergency Department: Payer: Medicare Other

## 2021-12-21 ENCOUNTER — Other Ambulatory Visit: Payer: Self-pay

## 2021-12-21 ENCOUNTER — Observation Stay (HOSPITAL_BASED_OUTPATIENT_CLINIC_OR_DEPARTMENT_OTHER)
Admit: 2021-12-21 | Discharge: 2021-12-21 | Disposition: A | Discharge status: Home / Self Care | Payer: Medicare Other | Attending: Internal Medicine | Admitting: Internal Medicine

## 2021-12-21 ENCOUNTER — Observation Stay: Payer: Medicare Other

## 2021-12-21 ENCOUNTER — Observation Stay
Admission: EM | Admit: 2021-12-21 | Discharge: 2021-12-22 | Disposition: A | Discharge status: Home / Self Care | Payer: Medicare Other | Attending: Osteopathic Medicine | Admitting: Osteopathic Medicine

## 2021-12-21 DIAGNOSIS — Z951 Presence of aortocoronary bypass graft: Secondary | ICD-10-CM | POA: Diagnosis not present

## 2021-12-21 DIAGNOSIS — I2510 Atherosclerotic heart disease of native coronary artery without angina pectoris: Secondary | ICD-10-CM | POA: Insufficient documentation

## 2021-12-21 DIAGNOSIS — I5032 Chronic diastolic (congestive) heart failure: Secondary | ICD-10-CM | POA: Insufficient documentation

## 2021-12-21 DIAGNOSIS — R0789 Other chest pain: Secondary | ICD-10-CM | POA: Diagnosis present

## 2021-12-21 DIAGNOSIS — Z7982 Long term (current) use of aspirin: Secondary | ICD-10-CM | POA: Diagnosis not present

## 2021-12-21 DIAGNOSIS — I110 Hypertensive heart disease with heart failure: Secondary | ICD-10-CM | POA: Diagnosis not present

## 2021-12-21 DIAGNOSIS — Z720 Tobacco use: Secondary | ICD-10-CM | POA: Diagnosis present

## 2021-12-21 DIAGNOSIS — E876 Hypokalemia: Secondary | ICD-10-CM | POA: Diagnosis not present

## 2021-12-21 DIAGNOSIS — Z79899 Other long term (current) drug therapy: Secondary | ICD-10-CM | POA: Insufficient documentation

## 2021-12-21 DIAGNOSIS — R079 Chest pain, unspecified: Secondary | ICD-10-CM | POA: Diagnosis not present

## 2021-12-21 DIAGNOSIS — Z8673 Personal history of transient ischemic attack (TIA), and cerebral infarction without residual deficits: Secondary | ICD-10-CM | POA: Insufficient documentation

## 2021-12-21 DIAGNOSIS — F17210 Nicotine dependence, cigarettes, uncomplicated: Secondary | ICD-10-CM | POA: Insufficient documentation

## 2021-12-21 DIAGNOSIS — I10 Essential (primary) hypertension: Secondary | ICD-10-CM

## 2021-12-21 LAB — COMPREHENSIVE METABOLIC PANEL
ALT: 12 U/L (ref 0–44)
AST: 21 U/L (ref 15–41)
Albumin: 3.6 g/dL (ref 3.5–5.0)
Alkaline Phosphatase: 63 U/L (ref 38–126)
Anion gap: 5 (ref 5–15)
BUN: 16 mg/dL (ref 8–23)
CO2: 27 mmol/L (ref 22–32)
Calcium: 8.7 mg/dL — ABNORMAL LOW (ref 8.9–10.3)
Chloride: 106 mmol/L (ref 98–111)
Creatinine, Ser: 1.2 mg/dL (ref 0.61–1.24)
GFR, Estimated: >60 mL/min (ref >60)
Glucose, Bld: 102 mg/dL — ABNORMAL HIGH (ref 70–99)
Potassium: 2.4 mmol/L — CL (ref 3.5–5.1)
Sodium: 138 mmol/L (ref 135–145)
Total Bilirubin: 1 mg/dL (ref 0.3–1.2)
Total Protein: 7.2 g/dL (ref 6.5–8.1)

## 2021-12-21 LAB — CBC WITH DIFFERENTIAL/PLATELET
Abs Immature Granulocytes: 0.02 10*3/uL (ref 0.00–0.07)
Basophils Absolute: 0 10*3/uL (ref 0.0–0.1)
Basophils Relative: 0 %
Eosinophils Absolute: 0.3 10*3/uL (ref 0.0–0.5)
Eosinophils Relative: 4 %
HCT: 41.5 % (ref 39.0–52.0)
Hemoglobin: 14 g/dL (ref 13.0–17.0)
Immature Granulocytes: 0 %
Lymphocytes Relative: 14 %
Lymphs Abs: 1.1 10*3/uL (ref 0.7–4.0)
MCH: 30.8 pg (ref 26.0–34.0)
MCHC: 33.7 g/dL (ref 30.0–36.0)
MCV: 91.2 fL (ref 80.0–100.0)
Monocytes Absolute: 0.7 10*3/uL (ref 0.1–1.0)
Monocytes Relative: 10 %
Neutro Abs: 5.4 10*3/uL (ref 1.7–7.7)
Neutrophils Relative %: 72 %
Platelets: 198 10*3/uL (ref 150–400)
RBC: 4.55 MIL/uL (ref 4.22–5.81)
RDW: 13.5 % (ref 11.5–15.5)
WBC: 7.5 10*3/uL (ref 4.0–10.5)
nRBC: 0 % (ref 0.0–0.2)

## 2021-12-21 LAB — BASIC METABOLIC PANEL
Anion gap: 8 (ref 5–15)
BUN: 16 mg/dL (ref 8–23)
CO2: 25 mmol/L (ref 22–32)
Calcium: 8.5 mg/dL — ABNORMAL LOW (ref 8.9–10.3)
Chloride: 106 mmol/L (ref 98–111)
Creatinine, Ser: 1.17 mg/dL (ref 0.61–1.24)
GFR, Estimated: >60 mL/min (ref >60)
Glucose, Bld: 113 mg/dL — ABNORMAL HIGH (ref 70–99)
Potassium: 2.7 mmol/L — CL (ref 3.5–5.1)
Sodium: 139 mmol/L (ref 135–145)

## 2021-12-21 LAB — MAGNESIUM
Magnesium: 1.9 mg/dL (ref 1.7–2.4)
Magnesium: 2 mg/dL (ref 1.7–2.4)

## 2021-12-21 LAB — ECHOCARDIOGRAM COMPLETE
AR max vel: 4.15 cm2
AV Area VTI: 4.25 cm2
AV Area mean vel: 3.97 cm2
AV Mean grad: 6 mmHg
AV Peak grad: 11.6 mmHg
Ao pk vel: 1.7 m/s
Area-P 1/2: 3.27 cm2
S' Lateral: 2.3 cm
Weight: 3248 oz

## 2021-12-21 LAB — TROPONIN I (HIGH SENSITIVITY)
Troponin I (High Sensitivity): 90 ng/L — ABNORMAL HIGH (ref <18)
Troponin I (High Sensitivity): 109 ng/L (ref <18)
Troponin I (High Sensitivity): 203 ng/L (ref <18)
Troponin I (High Sensitivity): 210 ng/L (ref <18)

## 2021-12-21 LAB — LIPASE, BLOOD: Lipase: 45 U/L (ref 11–51)

## 2021-12-21 MED ORDER — POTASSIUM CHLORIDE CRYS ER 20 MEQ PO TBCR
40.0000 meq | EXTENDED_RELEASE_TABLET | Freq: Once | ORAL | Status: AC
  Administered 2021-12-21: 40 meq via ORAL
  Filled 2021-12-21: qty 2

## 2021-12-21 MED ORDER — POTASSIUM CHLORIDE 10 MEQ/100ML IV SOLN
10.0000 meq | INTRAVENOUS | Status: AC
  Administered 2021-12-21 (×4): 10 meq via INTRAVENOUS
  Filled 2021-12-21 (×4): qty 100

## 2021-12-21 MED ORDER — FUROSEMIDE 20 MG PO TABS
20.0000 mg | ORAL_TABLET | Freq: Every day | ORAL | Status: DC
  Administered 2021-12-22: 20 mg via ORAL
  Filled 2021-12-21 (×2): qty 1

## 2021-12-21 MED ORDER — ASPIRIN 81 MG PO TBEC
81.0000 mg | DELAYED_RELEASE_TABLET | Freq: Every day | ORAL | Status: DC
  Administered 2021-12-22: 81 mg via ORAL
  Filled 2021-12-21: qty 1

## 2021-12-21 MED ORDER — HYDRALAZINE HCL 100 MG PO TABS: 50.0000 mg | ORAL_TABLET | Freq: Three times a day (TID) | ORAL | Status: DC

## 2021-12-21 MED ORDER — ENOXAPARIN SODIUM 40 MG/0.4ML IJ SOSY
40.0000 mg | PREFILLED_SYRINGE | INTRAMUSCULAR | Status: DC
  Administered 2021-12-21: 40 mg via SUBCUTANEOUS
  Filled 2021-12-21: qty 0.4

## 2021-12-21 MED ORDER — ONDANSETRON HCL 4 MG/2ML IJ SOLN: 4.0000 mg | Freq: Four times a day (QID) | INTRAMUSCULAR | Status: DC | PRN

## 2021-12-21 MED ORDER — HYDRALAZINE HCL 50 MG PO TABS: 50.0000 mg | ORAL_TABLET | Freq: Two times a day (BID) | ORAL | Status: DC

## 2021-12-21 MED ORDER — POTASSIUM CHLORIDE CRYS ER 20 MEQ PO TBCR
40.0000 meq | EXTENDED_RELEASE_TABLET | Freq: Once | ORAL | Status: DC
  Filled 2021-12-21: qty 2

## 2021-12-21 MED ORDER — CARVEDILOL 3.125 MG PO TABS
3.1250 mg | ORAL_TABLET | Freq: Two times a day (BID) | ORAL | Status: DC
  Administered 2021-12-21: 3.125 mg via ORAL
  Filled 2021-12-21 (×2): qty 1

## 2021-12-21 MED ORDER — PERFLUTREN LIPID MICROSPHERE
1.0000 mL | INTRAVENOUS | Status: AC | PRN
  Administered 2021-12-21: 4 mL via INTRAVENOUS

## 2021-12-21 MED ORDER — LISINOPRIL 20 MG PO TABS
40.0000 mg | ORAL_TABLET | Freq: Every day | ORAL | Status: DC
  Administered 2021-12-22: 40 mg via ORAL
  Filled 2021-12-21: qty 2
  Filled 2021-12-21: qty 4

## 2021-12-21 MED ORDER — PANTOPRAZOLE SODIUM 40 MG PO TBEC
40.0000 mg | DELAYED_RELEASE_TABLET | Freq: Every day | ORAL | Status: DC
  Administered 2021-12-22: 40 mg via ORAL
  Filled 2021-12-21 (×2): qty 1

## 2021-12-21 MED ORDER — ATORVASTATIN CALCIUM 80 MG PO TABS
80.0000 mg | ORAL_TABLET | Freq: Every day | ORAL | Status: DC
  Administered 2021-12-21: 80 mg via ORAL
  Filled 2021-12-21: qty 4

## 2021-12-21 MED ORDER — ACETAMINOPHEN 325 MG PO TABS: 650.0000 mg | ORAL_TABLET | ORAL | Status: DC | PRN

## 2021-12-21 MED ORDER — ISOSORBIDE MONONITRATE ER 60 MG PO TB24
60.0000 mg | ORAL_TABLET | Freq: Every day | ORAL | Status: DC
  Administered 2021-12-22: 60 mg via ORAL
  Filled 2021-12-21 (×2): qty 1

## 2021-12-21 MED ORDER — AMLODIPINE BESYLATE 10 MG PO TABS
10.0000 mg | ORAL_TABLET | Freq: Every day | ORAL | Status: DC
  Administered 2021-12-22: 10 mg via ORAL
  Filled 2021-12-21: qty 2
  Filled 2021-12-21: qty 1

## 2021-12-21 NOTE — ED Notes (Signed)
Pt stating he wants something other than water, options provided to patient, pt stating "Ill just thirst to death" because we dont have "sunkist". WCTM.

## 2021-12-21 NOTE — Consult Note (Signed)
Cardiology Consultation   Patient ID: Miguel Fletcher MRN: [id number]; DOB: [birth year]  Admit date: 12/21/2021 Date of Consult: 12/21/2021  PCP:  [facility], [place] Providers Cardiologist:  Ida Rogue, MD   {   Patient Profile:   Miguel Fletcher is a 66 y.o. male with a hx of CAD s/p CABG in 10/2020, HTN, HLD, Bell's Palsey, and medication noncompliance who is being seen 12/21/2021 for the evaluation of chest pain at the request of Dr. Francine Graven.  History of Present Illness:   Miguel Fletcher underwent CABG x5 10/2020. Post-procedure complicated by thoracentesis x 2 and acute heart failure. Limited echo showed LVEF 55%, severe LVH, severely dilated LA, mild MR. He was started on lasix '20mg'$  daily.   Seen in the [facility] ER August 2023 for hypertensive emergency with BP 240s/110s. He had run out of his medications. He was treated and sent home with HCTZ, amlodipine, Coreg, lisinopril and Imdur. Lasix was held for hypokalemia.   Last seen 12/14/21 and was doing OK, BP was still high.   The patient presented o the ER for chest pain. He took his medications this and within 5 minutes he had lightheadedness, dizziness, chest pain, nausea and vomiting. Symptoms lasted about 30 minutes. He took ASA '81mg'$  x 4. No recurrent symptoms. Chest pain was a pressure like sensation, has not felt this before. His partner has been sick. No recent fever or chills. Patient does not regularly take his medications, last time was 2 weeks ago. He ate yesterday.   In the ER BP 137/65, pulse 63bpm, RR 23. HS troponin 90>109. K 2.4, WBC 7.5, Hgb 14, Scr 1.2, normal LFTs. CXR showed cardiomegaly without acute disease. EKG showed NSR without ischemic changes. The patient was admitted for further work-up.    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 08/2020 MRI [facility]): Well-circumscribed T1 hypointense, T2 hyperintense enhancing mass within the  right axilla measuring 3.1 x 2.6 x 3.0 cm.  Mass extends along the course of the axillary neurovascular structures.  No evidence for invasion into the adjacent musculature.  No enlarged axillary lymph nodes.   Bell's palsy    Biliary colic    CAD (coronary artery disease)    a. 09/2020 NSTEMI/Cath: LM 60-70d, LAD 70p, D2 60, RI 70p, LCX ild diff dzs, RCA 40-26m RPDA 50, RPAV 50; b. 10/2020 CABG x 5 (LIMA->LAD, L radial->RI, VG->D1, VG->RPDA->RPL.   Diastolic dysfunction    a. 09/2020 Echo: EF 60-65%, no rwma, sev LVH. Gr1 DD. Nl RV size/fxn. Mod dil LA.   Hemorrhoids    Hypercholesterolemia    Hypertension    [place]    Tobacco use     Past Surgical History:  Procedure Laterality Date   CORONARY ARTERY BYPASS GRAFT N/A 10/28/2020   Procedure: CORONARY ARTERY BYPASS GRAFTING (CABG), ON PUMP, TIMES FIVE, USING LEFT INTERNAL MAMMARY ARTERY, LEFT RADIAL ARTERY AND ENDOSCOPICALLY HARVESTED RIGHT GREATER SAPHENOUS VEIN;  Surgeon: HMelrose Nakayama MD;  Location[place]  Service: Open Heart Surgery;  Laterality: N/A;   ENDOVEIN HARVEST OF GREATER SAPHENOUS VEIN Right 10/28/2020   Procedure: ENDOVEIN HARVEST OF RIGHT GREATER SAPHENOUS VEIN;  Surgeon: HMelrose Nakayama MD;  [place]  Service: Open Heart Surgery;  Laterality: Right;   INTRAVASCULAR ULTRASOUND/IVUS N/A 10/06/2020   Procedure: Intravascular Ultrasound/IVUS;  Surgeon: [name] MD;  [place];  Service: Cardiovascular;  Laterality: N/A;   IR THORACENTESIS ASP PLEURAL SPACE W/IMG GUIDE  11/12/2020   LEFT HEART CATH AND CORONARY ANGIOGRAPHY N/A 10/06/2020   Procedure: LEFT HEART CATH AND CORONARY ANGIOGRAPHY;  Surgeon: Sherren Mocha, MD;  Location: [place] CV LAB;  Service: Cardiovascular;  Laterality: N/A;   RADIAL ARTERY HARVEST Left 10/28/2020   Procedure: LEFT RADIAL ARTERY HARVEST;  Surgeon: Melrose Nakayama, MD;  Location: [place];  Service: Open Heart Surgery;   Laterality: Left;   TEE WITHOUT CARDIOVERSION N/A 10/28/2020   Procedure: TRANSESOPHAGEAL ECHOCARDIOGRAM (TEE);  Surgeon: Melrose Nakayama, MD;  Location: [place];  Service: Open Heart Surgery;  Laterality: N/A;   VARICOSE VEIN SURGERY Left      Home Medications:  Prior to Admission medications   Medication Sig Start Date End Date Taking? Authorizing Provider  amLODipine (NORVASC) 10 MG tablet Take 1 tablet (10 mg total) by mouth daily. 12/14/21 12/09/22  Minna Merritts, MD  aspirin EC 81 MG tablet Take 1 tablet (81 mg total) by mouth daily. Swallow whole. 12/14/21   Minna Merritts, MD  atorvastatin (LIPITOR) 80 MG tablet Take 1 tablet (80 mg total) by mouth daily at 8 pm. 12/14/21   Rockey Situ, Kathlene November, MD  carvedilol (COREG) 3.125 MG tablet Take 1 tablet (3.125 mg total) by mouth 2 (two) times daily with a meal. 12/14/21   Gollan, Kathlene November, MD  furosemide (LASIX) 20 MG tablet Take 1 tablet (20 mg total) by mouth daily. 12/14/21 12/09/22  Minna Merritts, MD  hydrALAZINE (APRESOLINE) 100 MG tablet Take 0.5 tablets (50 mg total) by mouth 3 (three) times daily. 12/14/21   Minna Merritts, MD  isosorbide mononitrate (IMDUR) 60 MG 24 hr tablet Take 1 tablet (60 mg total) by mouth daily. 12/14/21   Minna Merritts, MD  lisinopril (ZESTRIL) 40 MG tablet Take 1 tablet (40 mg total) by mouth daily. 12/14/21 12/09/22  Minna Merritts, MD  miconazole (MICOTIN) 2 % cream Apply 1 Application topically 2 (two) times daily. 12/14/21   Minna Merritts, MD  omeprazole (PRILOSEC) 20 MG capsule Take 1 capsule (20 mg total) by mouth daily. 12/14/21   Minna Merritts, MD  oxyCODONE (OXY IR/ROXICODONE) 5 MG immediate release tablet Take 1-2 tablets (5-10 mg total) by mouth every 4 (four) hours as needed for severe pain. Patient not taking: Reported on 12/14/2021 11/02/20   Barrett, Junie Panning R, PA-C  potassium chloride SA (KLOR-CON M) 20 MEQ tablet Take 1 tablet (20 mEq total) by mouth 2 (two) times daily for 5  days. Patient not taking: Reported on 12/14/2021 05/13/21 06/08/21  Miguel Hay, MD    Inpatient Medications: Scheduled Meds:  potassium chloride  40 mEq Oral Once   Continuous Infusions:  potassium chloride 10 mEq (12/21/21 1031)   PRN Meds:   Allergies:   No Known Allergies  Social History:   Social History   Socioeconomic History   Marital status: Single    Spouse name: Not on file   Number of children: Not on file   Years of education: Not on file   Highest education level: Not on file  Occupational History   Not on file  Tobacco Use   Smoking status: Some Days    Packs/day: 2.00    Years: 25.00    Total pack years: 50.00    Types: Cigarettes    Last attempt to quit: 11/06/2020    Years since quitting: 1.1   Smokeless tobacco: Never  Tobacco comments:    "I have smoked 4 cigs this week."  Vaping Use   Vaping Use: Never used  Substance and Sexual Activity   Alcohol use: No   Drug use: No   Sexual activity: Not on file  Other Topics Concern   Not on file  Social History Narrative   Not on file   Social Determinants of Health   Financial Resource Strain: Not on file  Food Insecurity: Not on file  Transportation Needs: Not on file  Physical Activity: Not on file  Stress: Not on file  Social Connections: Not on file  Intimate Partner Violence: Not on file    Family History:    Family History  Problem Relation Age of Onset   Alzheimer's disease Father    Heart disease Father    CAD Neg Hx      ROS:  Please see the history of present illness.   All other ROS reviewed and negative.     Physical Exam/Data:   Vitals:   12/21/21 0748 12/21/21 0802  BP:  137/65  Pulse:  63  Resp:  (!) 23  Temp:  97.6 F (36.4 C)  TempSrc:  Oral  SpO2:  99%  Weight: 92.1 kg    No intake or output data in the 24 hours ending 12/21/21 1039    12/21/2021    7:48 AM 12/14/2021    2:03 PM 06/08/2021    3:01 PM  Last 3 Weights  Weight (lbs) 203 lb 203 lb  206 lb 2 oz  Weight (kg) 92.08 kg 92.08 kg 93.498 kg     Body mass index is 29.98 kg/m.  General:  Well nourished, well developed, in no acute distress HEENT: normal Neck: no JVD Vascular: No carotid bruits; Distal pulses 2+ bilaterally Cardiac:  normal S1, S2; RRR; no murmur  Lungs:  clear to auscultation bilaterally, no wheezing, rhonchi or rales  Abd: soft, nontender, no hepatomegaly  Ext: no edema Musculoskeletal:  No deformities, BUE and BLE strength normal and equal Skin: warm and dry  Neuro:  CNs 2-12 intact, no focal abnormalities noted Psych:  Normal affect   EKG:  The EKG was personally reviewed and demonstrates:  NSR 63bpm, 1st degree AV block, LBBB, LVH with repol, LAD   Relevant CV Studies:  Echo 10/2020 1. Left ventricular ejection fraction, by estimation, is 55%. The left  ventricle has normal function. The left ventricle demonstrates regional  wall motion abnormalities (septal wall motion consistent with  postoperative state). There is severe left  ventricular hypertrophy.   2. Right ventricular systolic function is normal. The right ventricular  size is normal. There is normal pulmonary artery systolic pressure. The  estimated right ventricular systolic pressure is 29.9 mmHg.   3. Left atrial size was severely dilated.   4. The mitral valve is normal in structure. Mild mitral valve  regurgitation. No evidence of mitral stenosis.   5. Large left pleural effusion estimated >7 cm, up to 9 cm in select  images   Cardiac cath 09/2020   Dist LM to Prox LAD lesion is 70% stenosed.   Ramus lesion is 70% stenosed.   2nd Diag lesion is 60% stenosed.   Mid RCA lesion is 40% stenosed.   RPDA lesion is 50% stenosed.   RPAV lesion is 50% stenosed.   1.  Moderately severe, hemodynamically significant distal left main disease extending into the proximal LAD, confirmed by intravascular ultrasound assessment with minimal lumen area approximately 4 mm.  2.  Moderately  severe eccentric stenosis of the ramus intermedius branch 3.  Patent left circumflex with mild nonobstructive disease 4.  Patent RCA with mild to moderate mid vessel stenosis and moderate stenosis involving the PDA and PLA branch vessels. 5.  Systemic hypertension   The patient has surgical coronary anatomy.  His coronary disease is clearly significant but not critical and it would be reasonable to treat him with medical therapy, control his blood pressure, allow him to recover from [place], and arrange outpatient cardiac surgical consultation for consideration of CABG.  Laboratory Data:  High Sensitivity Troponin:   Recent Labs  Lab 12/21/21 0758  TROPONINIHS 90*     Chemistry Recent Labs  Lab 12/21/21 0757 12/21/21 0758  NA  --  138  K  --  2.4*  CL  --  106  CO2  --  27  GLUCOSE  --  102*  BUN  --  16  CREATININE  --  1.20  CALCIUM  --  8.7*  MG 2.0  1.9  --   GFRNONAA  --  >60  ANIONGAP  --  5    Recent Labs  Lab 12/21/21 0758  PROT 7.2  ALBUMIN 3.6  AST 21  ALT 12  ALKPHOS 63  BILITOT 1.0   Lipids No results for input(s): "CHOL", "TRIG", "HDL", "LABVLDL", "LDLCALC", "CHOLHDL" in the last 168 hours.  Hematology Recent Labs  Lab 12/21/21 0758  WBC 7.5  RBC 4.55  HGB 14.0  HCT 41.5  MCV 91.2  MCH 30.8  MCHC 33.7  RDW 13.5  PLT 198   Thyroid No results for input(s): "TSH", "FREET4" in the last 168 hours.  BNPNo results for input(s): "BNP", "PROBNP" in the last 168 hours.  DDimer No results for input(s): "DDIMER" in the last 168 hours.   Radiology/Studies:  DG Chest Portable 1 View  Result Date: 12/21/2021 CLINICAL DATA:  A 66 year old male presents for evaluation of chest pain. EXAM: PORTABLE CHEST 1 VIEW COMPARISON:  May 13, 2021 FINDINGS: EKG leads project over the chest. Heart size remains enlarged following median sternotomy for CABG. Hilar structures are stable. No consolidation. No pneumothorax or pleural effusion. On limited assessment no  acute skeletal findings. IMPRESSION: Cardiomegaly without acute cardiopulmonary disease. Electronically Signed   By: Zetta Bills M.D.   On: 12/21/2021 08:22     Assessment and Plan:   Chest pain CAD s/p CABG in 2022 - presented with nausea, vomiting, lightheadedness, sob and chest pain after taking his morning meds. Chest pain more atypical - no further chest pain reported - HS trop 90>109, seems to be chronically high - EKG unremarkable - plan to check an echocardiogram and Myoview lexsican - continue Aspirin, Coreg, Imdur and Lipitor  Hypokalemia - K 2.4, supplement K ordered - daily BMET  HTN - patient is noncompliant with medication - Continue amlodipine '10mg'$  daily, Coreg 3.'125mg'$ BID, Hydralazine '50mg'$  TID, Imdur '60mg'$  daily, lisinopril '40mg'$  daily - BP good  HLD - LDL 93 - continue Lipitor '80mg'$  daily  For questions or updates, please contact [place] Please consult [email or web address] for contact info under    Signed, Emmily Pellegrin Ninfa Meeker, PA-C  12/21/2021 10:39 AM

## 2021-12-21 NOTE — Assessment & Plan Note (Signed)
Smoking cessation discussed with patient in detail He declines a nicotine transdermal patch at this time 

## 2021-12-21 NOTE — Assessment & Plan Note (Signed)
Patient with a history of known coronary artery disease status post CABG who presents to the ER for evaluation of midsternal chest pain and has an elevated troponin level. Cycle cardiac enzymes Continue aspirin, nitrates, atorvastatin and carvedilol. Obtain 2D echocardiogram to rule out regional wall motion abnormality Consult cardiology

## 2021-12-21 NOTE — Assessment & Plan Note (Signed)
History of coronary artery disease status post CABG Continue aspirin, carvedilol, atorvastatin and nitrates

## 2021-12-21 NOTE — H&P (Signed)
History and Physical    Patient: Miguel Fletcher KGU:[id number] DOB: 1955-08-21 DOA: 12/21/2021 DOS: the patient was seen and examined on 12/21/2021 PCP: [facility]  Patient coming from: Home  Chief Complaint:  Chief Complaint  Patient presents with   Chest Pain   HPI: Miguel Fletcher is a 66 y.o. male with medical history significant for nicotine dependence, coronary artery disease status post CABG, hypertension, chronic diastolic dysfunction CHF and CVA who presents to the ER for evaluation of midsternal chest pain associated with nausea and vomiting.  Patient thinks he had an allergic reaction to one of the medications he had taken this morning and is unable to tell me which medicine.  Pain was midsternal, rated 6 x 10 in intensity at its worst, nonradiating and was associated with shortness of breath, nausea and an episode of vomiting.  He denied having any diaphoresis or palpitations and denied having abdominal pain.  At the time of my evaluation his pain has resolved. He denies having any fever, no chills, no cough, no abdominal pain, no changes in his bowel habits, no headache, no dizziness, no lightheadedness, no urinary symptoms, no blurred vision no focal deficit. Labs show troponin of 90, potassium 2.4 Twelve-lead EKG reviewed by me shows sinus rhythm with an old left bundle branch block  He will be referred to observation status for further evaluation   Review of Systems: As mentioned in the history of present illness. All other systems reviewed and are negative. 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 08/2020 MRI [facility]): Well-circumscribed T1 hypointense, T2 hyperintense enhancing mass within the right axilla measuring 3.1 x 2.6 x 3.0 cm.  Mass extends along the course of the axillary neurovascular structures.  No evidence for invasion into the adjacent musculature.  No enlarged axillary lymph nodes.   Bell's  palsy    Biliary colic    CAD (coronary artery disease)    a. 09/2020 NSTEMI/Cath: LM 60-70d, LAD 70p, D2 60, RI 70p, LCX ild diff dzs, RCA 40-50m RPDA 50, RPAV 50; b. 10/2020 CABG x 5 (LIMA->LAD, L radial->RI, VG->D1, VG->RPDA->RPL.   Diastolic dysfunction    a. 09/2020 Echo: EF 60-65%, no rwma, sev LVH. Gr1 DD. Nl RV size/fxn. Mod dil LA.   Hemorrhoids    Hypercholesterolemia    Hypertension    Stroke (cerebrum) ([place]    Tobacco use    Past Surgical History:  Procedure Laterality Date   CORONARY ARTERY BYPASS GRAFT N/A 10/28/2020   Procedure: CORONARY ARTERY BYPASS GRAFTING (CABG), ON PUMP, TIMES FIVE, USING LEFT INTERNAL MAMMARY ARTERY, LEFT RADIAL ARTERY AND ENDOSCOPICALLY HARVESTED RIGHT GREATER SAPHENOUS VEIN;  Surgeon: HMelrose Nakayama MD;  [place] [facility]  Service: Open Heart Surgery;  Laterality: N/A;   ENDOVEIN HARVEST OF GREATER SAPHENOUS VEIN Right 10/28/2020   Procedure: ENDOVEIN HARVEST OF RIGHT GREATER SAPHENOUS VEIN;  Surgeon: HMelrose Nakayama MD;  [place] [facility]  Service: Open Heart Surgery;  Laterality: Right;   INTRAVASCULAR ULTRASOUND/IVUS N/A 10/06/2020   Procedure: Intravascular Ultrasound/IVUS;  Surgeon: [name] MD;  [facility];  Service: Cardiovascular;  Laterality: N/A;   IR THORACENTESIS ASP PLEURAL SPACE W/IMG GUIDE  11/12/2020   LEFT HEART CATH AND CORONARY ANGIOGRAPHY N/A 10/06/2020   Procedure: LEFT HEART CATH AND CORONARY ANGIOGRAPHY;  Surgeon: [name] MD;  [place] [facility];  Service: Cardiovascular;  Laterality: N/A;   RADIAL ARTERY HARVEST  Left 10/28/2020   Procedure: LEFT RADIAL ARTERY HARVEST;  Surgeon: Melrose Nakayama, MD;  Location: [place];  Service: Open Heart Surgery;  Laterality: Left;   TEE WITHOUT CARDIOVERSION N/A 10/28/2020   Procedure: TRANSESOPHAGEAL ECHOCARDIOGRAM (TEE);  Surgeon: Melrose Nakayama, MD;  Location: [place];  Service: Open Heart Surgery;  Laterality: N/A;    VARICOSE VEIN SURGERY Left    Social History:  reports that he has been smoking cigarettes. He has a 50.00 pack-year smoking history. He has never used smokeless tobacco. He reports that he does not drink alcohol and does not use drugs.  No Known Allergies  Family History  Problem Relation Age of Onset   Alzheimer's disease Father    Heart disease Father    CAD Neg Hx     Prior to Admission medications   Medication Sig Start Date End Date Taking? Authorizing Provider  amLODipine (NORVASC) 10 MG tablet Take 1 tablet (10 mg total) by mouth daily. 12/14/21 12/09/22  Minna Merritts, MD  aspirin EC 81 MG tablet Take 1 tablet (81 mg total) by mouth daily. Swallow whole. 12/14/21   Minna Merritts, MD  atorvastatin (LIPITOR) 80 MG tablet Take 1 tablet (80 mg total) by mouth daily at 8 pm. 12/14/21   Rockey Situ, Kathlene November, MD  carvedilol (COREG) 3.125 MG tablet Take 1 tablet (3.125 mg total) by mouth 2 (two) times daily with a meal. 12/14/21   Gollan, Kathlene November, MD  furosemide (LASIX) 20 MG tablet Take 1 tablet (20 mg total) by mouth daily. 12/14/21 12/09/22  Minna Merritts, MD  hydrALAZINE (APRESOLINE) 100 MG tablet Take 0.5 tablets (50 mg total) by mouth 3 (three) times daily. 12/14/21   Minna Merritts, MD  isosorbide mononitrate (IMDUR) 60 MG 24 hr tablet Take 1 tablet (60 mg total) by mouth daily. 12/14/21   Minna Merritts, MD  lisinopril (ZESTRIL) 40 MG tablet Take 1 tablet (40 mg total) by mouth daily. 12/14/21 12/09/22  Minna Merritts, MD  miconazole (MICOTIN) 2 % cream Apply 1 Application topically 2 (two) times daily. 12/14/21   Minna Merritts, MD  omeprazole (PRILOSEC) 20 MG capsule Take 1 capsule (20 mg total) by mouth daily. 12/14/21   Minna Merritts, MD  oxyCODONE (OXY IR/ROXICODONE) 5 MG immediate release tablet Take 1-2 tablets (5-10 mg total) by mouth every 4 (four) hours as needed for severe pain. Patient not taking: Reported on 12/14/2021 11/02/20   Barrett, Junie Panning R, PA-C   potassium chloride SA (KLOR-CON M) 20 MEQ tablet Take 1 tablet (20 mEq total) by mouth 2 (two) times daily for 5 days. Patient not taking: Reported on 12/14/2021 05/13/21 06/08/21  Rada Hay, MD    Physical Exam: Vitals:   12/21/21 0748 12/21/21 0802  BP:  137/65  Pulse:  63  Resp:  (!) 23  Temp:  97.6 F (36.4 C)  TempSrc:  Oral  SpO2:  99%  Weight: 92.1 kg    Physical Exam Vitals and nursing note reviewed.  Constitutional:      Appearance: He is well-developed.  Eyes:     Pupils: Pupils are equal, round, and reactive to light.  Cardiovascular:     Rate and Rhythm: Normal rate and regular rhythm.     Heart sounds: Normal heart sounds.  Pulmonary:     Effort: Pulmonary effort is normal.  Abdominal:     General: Bowel sounds are normal.     Palpations: Abdomen is soft.  Musculoskeletal:        General: Normal range of motion.     Cervical back: Normal range of motion and neck supple.  Skin:    General: Skin is warm and dry.  Neurological:     General: No focal deficit present.     Mental Status: He is alert.  Psychiatric:        Mood and Affect: Mood normal.        Behavior: Behavior normal.     Data Reviewed: Relevant notes from primary care and specialist visits, past discharge summaries as available in EHR, including Care Everywhere. Prior diagnostic testing as pertinent to current admission diagnoses Updated medications and problem lists for reconciliation ED course, including vitals, labs, imaging, treatment and response to treatment Triage notes, nursing and pharmacy notes and ED provider's notes Notable results as noted in HPI Labs reviewed.  Magnesium 2.0, sodium 138, potassium 2.4, chloride 106, bicarb 27, glucose 102, BUN 16, creatinine 1.20, calcium 8.7, total protein 7.2, albumin 3.6, AST 21, ALT 12, alkaline phosphatase 63, total bilirubin 1.0, lipase 45, troponin 90, white count 7.5, hemoglobin 14.0, hematocrit 41.5, platelet count 198 Chest  x-ray reviewed by me shows Cardiomegaly without acute cardiopulmonary disease. Twelve-lead EKG reviewed by me shows sinus rhythm, prolonged PR interval and left bundle branch block. There are no new results to review at this time.  Assessment and Plan: * Chest pain Patient with a history of known coronary artery disease status post CABG who presents to the ER for evaluation of midsternal chest pain and has an elevated troponin level. Cycle cardiac enzymes Continue aspirin, nitrates, atorvastatin and carvedilol. Obtain 2D echocardiogram to rule out regional wall motion abnormality Consult cardiology  Chronic diastolic CHF (congestive heart failure) (HCC) Stable and not acutely exacerbated Continue furosemide, lisinopril and carvedilol  S/P CABG x 5 History of coronary artery disease status post CABG Continue aspirin, carvedilol, atorvastatin and nitrates  Tobacco use Smoking cessation discussed with patient in detail He declines a nicotine transdermal patch at this time  Essential hypertension Continue amlodipine, carvedilol, hydralazine, lisinopril and Imdur  Hypokalemia Secondary to diuretic use Supplement potassium Check magnesium levels      Advance Care Planning:   Code Status: Full Code   Consults: Cardiology  Family Communication: Greater than 50% of time was spent discussing patient's condition and plan of care with him at the bedside.  All questions and concerns have been addressed.  He verbalizes understanding and agrees with the plan.  Severity of Illness: The appropriate patient status for this patient is OBSERVATION. Observation status is judged to be reasonable and necessary in order to provide the required intensity of service to ensure the patient's safety. The patient's presenting symptoms, physical exam findings, and initial radiographic and laboratory data in the context of their medical condition is felt to place them at decreased risk for further clinical  deterioration. Furthermore, it is anticipated that the patient will be medically stable for discharge from the hospital within 2 midnights of admission.   Author: Collier Bullock, MD 12/21/2021 11:24 AM  For on call review [email or web address].

## 2021-12-21 NOTE — ED Provider Notes (Signed)
[facility] Provider Note    None    (approximate)   History   Chest Pain   HPI  Miguel Fletcher is a 66 y.o. male extensive past medical history including hypertension with intermittent compliance with medications, CAD status post CABG stable angina history of smoking presents to the ER for evaluation of chest pain.  States that this discomfort started immediately after taking his or her a.m. blood pressure medication.  States he felt like he was having some heartburn lasted a few minutes followed by an episode of vomiting.  Feels like he is getting better.  Felt like he was also having some shortness of breath at this time which is resolved he does not feel short of breath at this time.  He denies any discomfort.  No symptoms of heartburn.  Did not feel any significant change or discomfort with nitro that was given by EMS.  No diaphoresis.  No abdominal pain.  States that he did not take his blood pressure medications yesterday.  He is uncertain as to what medication it was that caused him to have this reaction.     Physical Exam   Triage Vital Signs: ED Triage Vitals  Enc Vitals Group     BP      Pulse      Resp      Temp      Temp src      SpO2      Weight      Height      Head Circumference      Peak Flow      Pain Score      Pain Loc      Pain Edu?      Excl. in [place]?     Most recent vital signs: Vitals:   12/21/21 0802  BP: 137/65  Pulse: 63  Resp: (!) 23  Temp: 97.6 F (36.4 C)  SpO2: 99%     Constitutional: Alert  Eyes: Conjunctivae are normal.  Head: Atraumatic. Nose: No congestion/rhinnorhea. Mouth/Throat: Mucous membranes are moist.   Neck: Painless ROM.  Cardiovascular:   Good peripheral circulation. Respiratory: Normal respiratory effort.  No retractions.  Gastrointestinal: Soft and nontender.  Musculoskeletal:  no deformity Neurologic:  MAE spontaneously. No gross focal neurologic deficits are appreciated.  Skin:   Skin is warm, dry and intact. No rash noted. Psychiatric: Mood and affect are normal. Speech and behavior are normal.    ED Results / Procedures / Treatments   Labs (all labs ordered are listed, but only abnormal results are displayed) Labs Reviewed  COMPREHENSIVE METABOLIC PANEL - Abnormal; Notable for the following components:      Result Value   Potassium 2.4 (*)    Glucose, Bld 102 (*)    Calcium 8.7 (*)    All other components within normal limits  TROPONIN I (HIGH SENSITIVITY) - Abnormal; Notable for the following components:   Troponin I (High Sensitivity) 90 (*)    All other components within normal limits  CBC WITH DIFFERENTIAL/PLATELET  LIPASE, BLOOD  MAGNESIUM  MAGNESIUM  TROPONIN I (HIGH SENSITIVITY)     EKG  ED ECG REPORT I, Merlyn Lot, the attending physician, personally viewed and interpreted this ECG.   Date: 12/21/2021  EKG Time: 7:54  Rate: 60  Rhythm: sinus  Axis: left  Intervals:lbbb  ST&T Change: lbbb, no sgarbossa, no stemi, no significant change from prior EKG 12/14/21    RADIOLOGY Please see ED Course  for my review and interpretation.  I personally reviewed all radiographic images ordered to evaluate for the above acute complaints and reviewed radiology reports and findings.  These findings were personally discussed with the patient.  Please see medical record for radiology report.    PROCEDURES:  Critical Care performed:   Procedures   MEDICATIONS ORDERED IN ED: Medications  potassium chloride 10 mEq in 100 mL IVPB (has no administration in time range)  potassium chloride SA (KLOR-CON M) CR tablet 40 mEq (has no administration in time range)  potassium chloride SA (KLOR-CON M) CR tablet 40 mEq (has no administration in time range)     IMPRESSION / MDM / ASSESSMENT AND PLAN / ED COURSE  I reviewed the triage vital signs and the nursing notes.                              Differential diagnosis includes, but is not  limited to, ACS, pericarditis, esophagitis, boerhaaves, pe, dissection, pna, bronchitis, costochondritis  Patient presenting to the ER for evaluation of symptoms as described above.  Base on symptoms, risk factors and considered above differential, this presenting complaint could reflect a potentially life-threatening illness therefore the patient will be placed on continuous pulse oximetry and telemetry for monitoring.  Laboratory evaluation will be sent to evaluate for the above complaints.  Given history of presenting illness with unchanged EKG seems less consistent with ACS.  Chest x-ray on my review and interpretation does not show any evidence of pneumothorax or consolidation.  Have lower suspicion for PE or dissection based on his presentation his abdominal exam is soft and benign.  Possible allergic reaction but no hives and he is  asymptomatic at this time.  Will observe on cardiac monitor for serial troponins.    Clinical Course as of 12/21/21 0956  Tue Dec 21, 2021  0931 Patient's potassium is critically low at 2.4 can explain some of the EKG site changes from previous.  His troponin is elevated 90.  He is currently pain-free.  Given his age and risk factors significant cardiac history with acute hypokalemia episode of chest pain today I do believe that hospitalization for observation medical treatment clinically indicated.  I have ordered IV and oral potassium repletion.  Patient agreeable to plan. [PR]  [phone] Discussed case in consultation with hospitalist who agreed admit patient to their service. [PR]    Clinical Course User Index [PR] Merlyn Lot, MD     FINAL CLINICAL IMPRESSION(S) / ED DIAGNOSES   Final diagnoses:  Atypical chest pain  Hypokalemia     Rx / DC Orders   ED Discharge Orders     None        Note:  This document was prepared using Dragon voice recognition software and may include unintentional dictation errors.    Merlyn Lot, MD 12/21/21  [phone]

## 2021-12-21 NOTE — Assessment & Plan Note (Signed)
Stable and not acutely exacerbated Continue furosemide, lisinopril and carvedilol

## 2021-12-21 NOTE — Assessment & Plan Note (Signed)
Continue amlodipine, carvedilol, hydralazine, lisinopril and Imdur

## 2021-12-21 NOTE — ED Triage Notes (Signed)
Pt started new cardiac med this morning at 0600, unknown which one, and since then has had chest pain and vomiting. Chest pain 1/10 at this time, improving. Pt still vomiting. Bells palst to right side of face x1 month. Pt also notes lump to right axilla x1 year that has not been evaluated.

## 2021-12-21 NOTE — Assessment & Plan Note (Signed)
Secondary to diuretic use Supplement potassium Check magnesium levels 

## 2021-12-22 ENCOUNTER — Observation Stay (HOSPITAL_BASED_OUTPATIENT_CLINIC_OR_DEPARTMENT_OTHER): Payer: Medicare Other

## 2021-12-22 DIAGNOSIS — R072 Precordial pain: Secondary | ICD-10-CM | POA: Diagnosis not present

## 2021-12-22 DIAGNOSIS — R079 Chest pain, unspecified: Secondary | ICD-10-CM | POA: Diagnosis not present

## 2021-12-22 DIAGNOSIS — Z951 Presence of aortocoronary bypass graft: Secondary | ICD-10-CM | POA: Diagnosis not present

## 2021-12-22 DIAGNOSIS — Z720 Tobacco use: Secondary | ICD-10-CM

## 2021-12-22 DIAGNOSIS — E876 Hypokalemia: Secondary | ICD-10-CM | POA: Diagnosis not present

## 2021-12-22 DIAGNOSIS — I10 Essential (primary) hypertension: Secondary | ICD-10-CM | POA: Diagnosis not present

## 2021-12-22 DIAGNOSIS — I5032 Chronic diastolic (congestive) heart failure: Secondary | ICD-10-CM | POA: Diagnosis not present

## 2021-12-22 LAB — BASIC METABOLIC PANEL
Anion gap: 3 — ABNORMAL LOW (ref 5–15)
BUN: 18 mg/dL (ref 8–23)
CO2: 26 mmol/L (ref 22–32)
Calcium: 8.3 mg/dL — ABNORMAL LOW (ref 8.9–10.3)
Chloride: 110 mmol/L (ref 98–111)
Creatinine, Ser: 1.17 mg/dL (ref 0.61–1.24)
GFR, Estimated: >60 mL/min (ref >60)
Glucose, Bld: 84 mg/dL (ref 70–99)
Potassium: 2.6 mmol/L — CL (ref 3.5–5.1)
Sodium: 139 mmol/L (ref 135–145)

## 2021-12-22 LAB — NM MYOCAR MULTI W/SPECT W/WALL MOTION / EF
LV dias vol: 159 mL (ref 62–150)
LV sys vol: 97 mL
Nuc Stress EF: 39 %
Peak HR: 87 {beats}/min
Rest HR: 61 {beats}/min
Rest Nuclear Isotope Dose: 10.3 mCi
SDS: 0
SRS: 7
SSS: 2
ST Depression (mm): 0 mm
Stress Nuclear Isotope Dose: 32 mCi
TID: 1.12

## 2021-12-22 LAB — POTASSIUM: Potassium: 3.3 mmol/L — ABNORMAL LOW (ref 3.5–5.1)

## 2021-12-22 LAB — TROPONIN I (HIGH SENSITIVITY): Troponin I (High Sensitivity): 176 ng/L (ref <18)

## 2021-12-22 LAB — HIV ANTIBODY (ROUTINE TESTING W REFLEX): HIV Screen 4th Generation wRfx: NONREACTIVE

## 2021-12-22 MED ORDER — POTASSIUM CHLORIDE 10 MEQ/100ML IV SOLN
10.0000 meq | INTRAVENOUS | Status: DC
  Administered 2021-12-22: 10 meq via INTRAVENOUS

## 2021-12-22 MED ORDER — POTASSIUM CHLORIDE CRYS ER 20 MEQ PO TBCR
40.0000 meq | EXTENDED_RELEASE_TABLET | Freq: Three times a day (TID) | ORAL | Status: DC
  Administered 2021-12-22: 40 meq via ORAL
  Filled 2021-12-22: qty 2

## 2021-12-22 MED ORDER — HYDRALAZINE HCL 50 MG PO TABS
50.0000 mg | ORAL_TABLET | Freq: Four times a day (QID) | ORAL | Status: DC
  Administered 2021-12-22 (×2): 50 mg via ORAL
  Filled 2021-12-22 (×2): qty 1

## 2021-12-22 MED ORDER — TECHNETIUM TC 99M TETROFOSMIN IV KIT
10.0000 | PACK | Freq: Once | INTRAVENOUS | Status: AC | PRN
  Administered 2021-12-22: 10 via INTRAVENOUS

## 2021-12-22 MED ORDER — POTASSIUM CHLORIDE CRYS ER 20 MEQ PO TBCR: 20.0000 meq | EXTENDED_RELEASE_TABLET | Freq: Two times a day (BID) | ORAL | 0 refills | Status: DC

## 2021-12-22 MED ORDER — POTASSIUM CHLORIDE 10 MEQ/100ML IV SOLN
10.0000 meq | INTRAVENOUS | Status: DC
  Filled 2021-12-22 (×4): qty 100

## 2021-12-22 MED ORDER — REGADENOSON 0.4 MG/5ML IV SOLN
0.4000 mg | Freq: Once | INTRAVENOUS | Status: AC
  Administered 2021-12-22: 0.4 mg via INTRAVENOUS

## 2021-12-22 MED ORDER — TECHNETIUM TC 99M TETROFOSMIN IV KIT
31.9600 | PACK | Freq: Once | INTRAVENOUS | Status: AC | PRN
  Administered 2021-12-22: 31.96 via INTRAVENOUS

## 2021-12-22 NOTE — Hospital Course (Addendum)
Miguel Fletcher is a 66 y.o. male with medical history significant for nicotine dependence, coronary artery disease status post CABG, hypertension, chronic diastolic dysfunction CHF and CVA who presents to the ER  12/21/2021 for evaluation of midsternal chest pain associated with nausea and vomiting.  Of note, had not taken his medications for at least 1 to 2 weeks, intermittent compliance has been noted to be a longstanding issue. 10/03: Initial labs troponin 90, hypokalemia with potassium 2.4, EKG SR, first-degree AV block, old LBBB.  Admitted to hospitalist service under observation status. 2D echo ordered.  Home medications continued.  Cardiology consulted: Noted atypical chest pain, most consistent with some sort of medication reaction/gastric irritation given history of nonadherence to medication, however given mildly elevated troponin would obtain echocardiogram and myocardial perfusion stress test. Echocardiogram: LVEF 60 to 65%, normal function, no RWMA, severe LVH, grade 1 DD, RV systolic function low normal.  Troponin peak at 210 10/01: Troponin slightly lower now at 176. Still significantly hypokalemic this morning 2.7. Pt is refusing IV potassium so p.o. was ordered. Stress test ___  Consultants:  Cardiology  Procedures: 12/21/2021 Echocardiogram: LVEF 60 to 65%, normal function, no RWMA, severe LVH, grade 1 DD, RV systolic function low normal Stress test ___      ASSESSMENT & PLAN:   Principal Problem:   Chest pain Active Problems:   Hypokalemia   Essential hypertension   Tobacco use   S/P CABG x 5   Chronic diastolic CHF (congestive heart failure) (HCC)  Chest pain - atypical, concerning for gastric irritation from medication, echocardiogram noted no significant wall motion abnormalities, troponins have started trending down Continue aspirin, nitrates, atorvastatin and carvedilol. Cardiology following Stress test planned for today  Hypokalemia Secondary to diuretic  use Supplement potassium Check magnesium levels  S/P CABG x 5 Continue aspirin, carvedilol, atorvastatin and nitrates  Tobacco use Smoking cessation discussed with patient in detail He declines a nicotine transdermal patch at this time  Essential hypertension Continue amlodipine, carvedilol, hydralazine, lisinopril and Imdur  Chronic diastolic CHF (congestive heart failure) (HCC) Stable and not acutely exacerbated Continue furosemide, lisinopril and carvedilol Cardiology following   DVT prophylaxis: Lovenox Pertinent IV fluids/nutrition: no continuous IV fluids  Central lines / invasive devices: none  Code Status: FULL CODE Family Communication: ***  Disposition: observation but may need to convert to inpatient  TOC needs: None at this time Barriers to discharge / significant pending items: pending stress test/cardiology recommendations and correction of significant hypokalemia

## 2021-12-22 NOTE — Plan of Care (Signed)
Dr. Informed of critical Potassium result of 2.6

## 2021-12-22 NOTE — Discharge Summary (Signed)
Physician Discharge Summary   Patient: Miguel Fletcher MRN: [id number]  DOB: 1956-03-09   Admit:     Date of Admission: 12/21/2021 Admitted from: home   Discharge: Date of discharge: 12/22/21 Disposition: Home Condition at discharge: good  CODE STATUS: FULL CODE     Discharge Physician: Emeterio Reeve, DO [facility]     PCP: [facility]  Recommendations for Outpatient Follow-up:  Follow up with PCP [facility] in 1-2 weeks Follow-up with cardiology, Dr. Rockey Situ, in 1 to 2 weeks Please obtain labs/tests: BMP to monitor potassium in 1 week or less! Please follow up on the following pending results: none   Discharge Instructions     Increase activity slowly   Complete by: As directed          Discharge Diagnoses: Principal Problem:   Chest pain Active Problems:   Hypokalemia   Essential hypertension   Tobacco use   S/P CABG x 5   Chronic diastolic CHF (congestive heart failure) [facility])       Hospital Course: Miguel Fletcher is a 66 y.o. male with medical history significant for nicotine dependence, coronary artery disease status post CABG, hypertension, chronic diastolic dysfunction CHF and CVA who presents to the ER  12/21/2021 for evaluation of midsternal chest pain associated with nausea and vomiting.  Of note, had not taken his medications for at least 1 to 2 weeks, intermittent compliance has been noted to be a longstanding issue. 10/03: Initial labs troponin 90, hypokalemia with potassium 2.4, EKG SR, first-degree AV block, old LBBB.  Admitted to hospitalist service under observation status. 2D echo ordered.  Home medications continued.  Cardiology consulted: Noted atypical chest pain, most consistent with some sort of medication reaction/gastric irritation given history of nonadherence to medication, however given mildly elevated troponin would obtain echocardiogram and myocardial perfusion stress test.  Echocardiogram: LVEF 60 to 65%, normal function, no RWMA, severe LVH, grade 1 DD, RV systolic function low normal.  Troponin peak at 210 10/01: Troponin slightly lower now at 176. Still significantly hypokalemic this morning 2.7. Pt is refusing IV potassium so p.o. was ordered. Stress test was low risk.  Potassium repeat was 3.3.  Per cardiology, okay for discharge and follow-up outpatient  Consultants:  Cardiology  Procedures: 12/21/2021 Echocardiogram: LVEF 60 to 65%, normal function, no RWMA, severe LVH, grade 1 DD, RV systolic function low normal Stress test NM myocardial SPECT -no ischemia, low risk, normal LV perfusion, EF 45%, LAD and RCA calcifications      ASSESSMENT & PLAN:   Principal Problem:   Chest pain Active Problems:   Hypokalemia   Essential hypertension   Tobacco use   S/P CABG x 5   Chronic diastolic CHF (congestive heart failure) (HCC)  Chest pain - atypical, concerning for gastric irritation from medication, echocardiogram noted no significant wall motion abnormalities, troponins have started trending down Continue aspirin, nitrates, atorvastatin and carvedilol. Cardiology following Stress test low risk  Hypokalemia Secondary to diuretic use Supplement potassium  S/P CABG x 5 Continue aspirin, carvedilol, atorvastatin and nitrates  Tobacco use Smoking cessation discussed with patient in detail He declines a nicotine transdermal patch at this time  Essential hypertension Continue amlodipine, carvedilol, hydralazine, lisinopril and Imdur  Chronic diastolic CHF (congestive heart failure) (HCC) Stable and not acutely exacerbated Continue furosemide, lisinopril and carvedilol Cardiology following         Discharge Instructions  Allergies as of 12/22/2021   No Known  Allergies      Medication List     STOP taking these medications    oxyCODONE 5 MG immediate release tablet Commonly known as: Oxy IR/ROXICODONE       TAKE these  medications    amLODipine 10 MG tablet Commonly known as: NORVASC Take 1 tablet (10 mg total) by mouth daily.   aspirin EC 81 MG tablet Take 1 tablet (81 mg total) by mouth daily. Swallow whole.   atorvastatin 80 MG tablet Commonly known as: LIPITOR Take 1 tablet (80 mg total) by mouth daily at 8 pm.   carvedilol 3.125 MG tablet Commonly known as: COREG Take 1 tablet (3.125 mg total) by mouth 2 (two) times daily with a meal.   furosemide 20 MG tablet Commonly known as: Lasix Take 1 tablet (20 mg total) by mouth daily.   hydrALAZINE 100 MG tablet Commonly known as: APRESOLINE Take 0.5 tablets (50 mg total) by mouth 3 (three) times daily.   isosorbide mononitrate 60 MG 24 hr tablet Commonly known as: IMDUR Take 1 tablet (60 mg total) by mouth daily.   lisinopril 40 MG tablet Commonly known as: ZESTRIL Take 1 tablet (40 mg total) by mouth daily.   miconazole 2 % cream Commonly known as: MICOTIN Apply 1 Application topically 2 (two) times daily.   omeprazole 20 MG capsule Commonly known as: PRILOSEC Take 1 capsule (20 mg total) by mouth daily.   potassium chloride SA 20 MEQ tablet Commonly known as: KLOR-CON M Take 1 tablet (20 mEq total) by mouth 2 (two) times daily for 5 days.         Follow-up Information     Miguel Fletcher November, MD. Call today.   Specialty: Cardiology Why: Make an appointment for hospital follow-up.  Ideally, appointment within the next 1 to 2 weeks. Contact information: [place] [address] [phone]                 No Known Allergies   Subjective: Patient states "I feel fine and I would like to leave."  No chest pain, no shortness of breath.   Discharge Exam: BP (!) 160/68 (BP Location: Right Arm)   Pulse 63   Temp 98.1 F (36.7 C)   Resp 18   Ht '5\' 8"'$  (1.727 m)   Wt 92.1 kg   SpO2 99%   BMI 30.87 kg/m  General: Pt is alert, awake, not in acute distress Cardiovascular: RRR, S1/S2 +,  no rubs, no gallops Respiratory: CTA bilaterally, no wheezing, no rhonchi but he does have diminished breath sounds bilaterally/all fields Abdominal: Soft, NT, ND, bowel sounds + Extremities: no edema, no cyanosis     The results of significant diagnostics from this hospitalization (including imaging, microbiology, ancillary and laboratory) are listed below for reference.     Microbiology: No results found for this or any previous visit (from the past 240 hour(s)).   Labs: BNP (last 3 results) No results for input(s): "BNP" in the last 8760 hours. Basic Metabolic Panel: Recent Labs  Lab 12/21/21 0757 12/21/21 0758 12/21/21 1820 12/22/21 0543 12/22/21 1222  NA  --  138 139 139  --   K  --  2.4* 2.7* 2.6* 3.3*  CL  --  106 106 110  --   CO2  --  '27 25 26  '$ --   GLUCOSE  --  102* 113* 84  --   BUN  --  '16 16 18  '$ --  CREATININE  --  1.20 1.17 1.17  --   CALCIUM  --  8.7* 8.5* 8.3*  --   MG 2.0  1.9  --   --   --   --    Liver Function Tests: Recent Labs  Lab 12/21/21 0758  AST 21  ALT 12  ALKPHOS 63  BILITOT 1.0  PROT 7.2  ALBUMIN 3.6   Recent Labs  Lab 12/21/21 0758  LIPASE 45   No results for input(s): "AMMONIA" in the last 168 hours. CBC: Recent Labs  Lab 12/21/21 0758  WBC 7.5  NEUTROABS 5.4  HGB 14.0  HCT 41.5  MCV 91.2  PLT 198   Cardiac Enzymes: No results for input(s): "CKTOTAL", "CKMB", "CKMBINDEX", "TROPONINI" in the last 168 hours. BNP: Invalid input(s): "POCBNP" CBG: No results for input(s): "GLUCAP" in the last 168 hours. D-Dimer No results for input(s): "DDIMER" in the last 72 hours. Hgb A1c No results for input(s): "HGBA1C" in the last 72 hours. Lipid Profile No results for input(s): "CHOL", "HDL", "LDLCALC", "TRIG", "CHOLHDL", "LDLDIRECT" in the last 72 hours. Thyroid function studies No results for input(s): "TSH", "T4TOTAL", "T3FREE", "THYROIDAB" in the last 72 hours.  Invalid input(s): "FREET3" Anemia work up No results  for input(s): "VITAMINB12", "FOLATE", "FERRITIN", "TIBC", "IRON", "RETICCTPCT" in the last 72 hours. Urinalysis    Component Value Date/Time   COLORURINE AMBER (A) 10/26/2020 1513   APPEARANCEUR HAZY (A) 10/26/2020 1513   APPEARANCEUR Clear 04/22/2011 0951   LABSPEC 1.021 10/26/2020 1513   LABSPEC 1.004 04/22/2011 0951   PHURINE 5.0 10/26/2020 1513   GLUCOSEU NEGATIVE 10/26/2020 1513   GLUCOSEU Negative 04/22/2011 0951   HGBUR NEGATIVE 10/26/2020 1513   BILIRUBINUR NEGATIVE 10/26/2020 1513   BILIRUBINUR Negative 04/22/2011 [place] 10/26/2020 1513   PROTEINUR NEGATIVE 10/26/2020 1513   NITRITE NEGATIVE 10/26/2020 1513   LEUKOCYTESUR NEGATIVE 10/26/2020 1513   LEUKOCYTESUR Negative 04/22/2011 0951   Sepsis Labs Recent Labs  Lab 12/21/21 0758  WBC 7.5   Microbiology No results found for this or any previous visit (from the past 240 hour(s)). Imaging NM Myocar Multi W/Spect W/Wall Motion / EF  Result Date: 12/22/2021   Findings are consistent with no ischemia. The study is low risk.   LV perfusion is normal.   Left ventricular function is normal visually. calculated EF 45%, correlation with echo advised   LAD and RCA calcifications noted   Study quality degraded by diaphragmatic attenuations   ECHOCARDIOGRAM COMPLETE  Result Date: 12/21/2021    ECHOCARDIOGRAM REPORT   Patient Name:   JERMAIN CURT Date of Exam: 12/21/2021 Medical Rec #:  [id number]         Height:       69.0 in Accession #:    [id number]        Weight:       203.0 lb Date of Birth:  [birth year]         BSA:          2.079 m Patient Age:    36 years          BP:           167/73 mmHg Patient Gender: M                 HR:           67 bpm. Exam Location:  [facility] Procedure: 2D Echo, Cardiac Doppler, Color Doppler and Intracardiac  Opacification Agent Indications:     Elevated Troponin  History:         Patient has prior history of Echocardiogram examinations, most                  recent  11/12/2020. CHF, Previous Myocardial Infarction and CAD,                  Prior CABG; Risk Factors:Current Smoker, Hypertension and                  Dyslipidemia.  Sonographer:     Rosalia Hammers Referring Phys:  [id number] AGBATA Diagnosing Phys: Nelva Bush MD IMPRESSIONS  1. Left ventricular ejection fraction, by estimation, is 60 to 65%. The left ventricle has normal function. The left ventricle has no regional wall motion abnormalities. There is severe left ventricular hypertrophy. Left ventricular diastolic parameters  are consistent with Grade I diastolic dysfunction (impaired relaxation). Elevated left atrial pressure.  2. Right ventricular systolic function is low normal. The right ventricular size is mildly enlarged.  3. Left atrial size was mildly dilated.  4. The mitral valve is degenerative. Mild mitral valve regurgitation.  5. The aortic valve is tricuspid. Aortic valve regurgitation is not visualized. No aortic stenosis is present.  6. Aortic dilatation noted. There is borderline dilatation of the aortic root, measuring 38 mm.  7. The inferior vena cava is normal in size with <50% respiratory variability, suggesting right atrial pressure of 8 mmHg. FINDINGS  Left Ventricle: Left ventricular ejection fraction, by estimation, is 60 to 65%. The left ventricle has normal function. The left ventricle has no regional wall motion abnormalities. Definity contrast agent was given IV to delineate the left ventricular  endocardial borders. The left ventricular internal cavity size was normal in size. There is severe left ventricular hypertrophy. Left ventricular diastolic parameters are consistent with Grade I diastolic dysfunction (impaired relaxation). Elevated left  atrial pressure. Right Ventricle: The right ventricular size is mildly enlarged. No increase in right ventricular wall thickness. Right ventricular systolic function is low normal. Left Atrium: Left atrial size was mildly dilated. Right  Atrium: Right atrial size was normal in size. Pericardium: There is no evidence of pericardial effusion. Mitral Valve: The mitral valve is degenerative in appearance. Mild to moderate mitral annular calcification. Mild mitral valve regurgitation. Tricuspid Valve: The tricuspid valve is normal in structure. Tricuspid valve regurgitation is mild. Aortic Valve: The aortic valve is tricuspid. Aortic valve regurgitation is not visualized. No aortic stenosis is present. Aortic valve mean gradient measures 6.0 mmHg. Aortic valve peak gradient measures 11.6 mmHg. Aortic valve area, by VTI measures 4.25  cm. Pulmonic Valve: The pulmonic valve was normal in structure. Pulmonic valve regurgitation is not visualized. No evidence of pulmonic stenosis. Aorta: Aortic dilatation noted. There is borderline dilatation of the aortic root, measuring 38 mm. Pulmonary Artery: The pulmonary artery is of normal size. Venous: The inferior vena cava is normal in size with less than 50% respiratory variability, suggesting right atrial pressure of 8 mmHg. IAS/Shunts: No atrial level shunt detected by color flow Doppler.  LEFT VENTRICLE PLAX 2D LVIDd:         4.10 cm   Diastology LVIDs:         2.30 cm   LV e' medial:    3.59 cm/s LV PW:         2.10 cm   LV E/e' medial:  23.6 LV IVS:  1.80 cm   LV e' lateral:   5.00 cm/s LVOT diam:     2.30 cm   LV E/e' lateral: 16.9 LV SV:         134 LV SV Index:   64 LVOT Area:     4.15 cm  RIGHT VENTRICLE RV Basal diam:  4.40 cm RV Mid diam:    3.20 cm RV S prime:     7.97 cm/s TAPSE (M-mode): 1.6 cm LEFT ATRIUM             Index        RIGHT ATRIUM           Index LA diam:        3.60 cm 1.73 cm/m   RA Area:     20.70 cm LA Vol (A2C):   92.9 ml 44.68 ml/m  RA Volume:   55.40 ml  26.65 ml/m LA Vol (A4C):   65.7 ml 31.60 ml/m LA Biplane Vol: 81.1 ml 39.01 ml/m  AORTIC VALVE AV Area (Vmax):    4.15 cm AV Area (Vmean):   3.97 cm AV Area (VTI):     4.25 cm AV Vmax:           170.00 cm/s AV  Vmean:          112.000 cm/s AV VTI:            0.315 m AV Peak Grad:      11.6 mmHg AV Mean Grad:      6.0 mmHg LVOT Vmax:         170.00 cm/s LVOT Vmean:        107.000 cm/s LVOT VTI:          0.322 m LVOT/AV VTI ratio: 1.02  AORTA Ao Root diam: 3.80 cm Ao Asc diam:  3.50 cm MITRAL VALVE MV Area (PHT): 3.27 cm     SHUNTS MV Decel Time: 232 msec     Systemic VTI:  0.32 m MV E velocity: 84.60 cm/s   Systemic Diam: 2.30 cm MV A velocity: 114.00 cm/s MV E/A ratio:  0.74 Harrell Gave End MD Electronically signed by Nelva Bush MD Signature Date/Time: 12/21/2021/5:38:49 PM    Final    DG Chest Portable 1 View  Result Date: 12/21/2021 CLINICAL DATA:  A 66 year old male presents for evaluation of chest pain. EXAM: PORTABLE CHEST 1 VIEW COMPARISON:  May 13, 2021 FINDINGS: EKG leads project over the chest. Heart size remains enlarged following median sternotomy for CABG. Hilar structures are stable. No consolidation. No pneumothorax or pleural effusion. On limited assessment no acute skeletal findings. IMPRESSION: Cardiomegaly without acute cardiopulmonary disease. Electronically Signed   By: Zetta Bills M.D.   On: 12/21/2021 08:22      Time coordinating discharge: over 30 minutes  SIGNED:  Emeterio Reeve DO [facility]

## 2021-12-22 NOTE — Progress Notes (Signed)
Rounding Note    Patient Name: Miguel Fletcher Date of Encounter: 12/22/2021  [place] Cardiologist: Ida Rogue, MD   Subjective   Patient denies chest pain. Plan for Myoview stress test today, but patient is saying he wants to leave.   Inpatient Medications    Scheduled Meds:  amLODipine  10 mg Oral Daily   aspirin EC  81 mg Oral Daily   atorvastatin  80 mg Oral Q2000   carvedilol  3.125 mg Oral BID WC   enoxaparin (LOVENOX) injection  40 mg Subcutaneous Q24H   furosemide  20 mg Oral Daily   isosorbide mononitrate  60 mg Oral Daily   lisinopril  40 mg Oral Daily   pantoprazole  40 mg Oral Daily   potassium chloride  40 mEq Oral Once   Continuous Infusions:  potassium chloride     PRN Meds: acetaminophen, ondansetron (ZOFRAN) IV   Vital Signs    Vitals:   12/21/21 2007 12/21/21 2151 12/22/21 0107 12/22/21 0539  BP: (!) 166/78 (!) 140/56 (!) 156/51 (!) 177/77  Pulse: 71 69 63 63  Resp: '18 18 18 18  '$ Temp: 99 F (37.2 C) 98.9 F (37.2 C) 98.9 F (37.2 C) 99 F (37.2 C)  TempSrc: Oral Oral Oral Oral  SpO2: 96% 97% 99% 99%  Weight:        Intake/Output Summary (Last 24 hours) at 12/22/2021 0734 Last data filed at 12/21/2021 1425 Gross per 24 hour  Intake 300 ml  Output --  Net 300 ml      12/21/2021    7:48 AM 12/14/2021    2:03 PM 06/08/2021    3:01 PM  Last 3 Weights  Weight (lbs) 203 lb 203 lb 206 lb 2 oz  Weight (kg) 92.08 kg 92.08 kg 93.498 kg      Telemetry    NSR HR 60s - Personally Reviewed  ECG    No new - Personally Reviewed  Physical Exam   GEN: No acute distress.   Neck: No JVD Cardiac: RRR, no murmurs, rubs, or gallops.  Respiratory: Clear to auscultation bilaterally. GI: Soft, nontender, non-distended  MS: No edema; No deformity. Neuro:  Nonfocal  Psych: Normal affect   Labs    High Sensitivity Troponin:   Recent Labs  Lab 12/21/21 0758 12/21/21 1035 12/21/21 1520 12/21/21 1820  TROPONINIHS 90*  109* 203* 210*     Chemistry Recent Labs  Lab 12/21/21 0757 12/21/21 0758 12/21/21 1820 12/22/21 0543  NA  --  138 139 139  K  --  2.4* 2.7* 2.6*  CL  --  106 106 110  CO2  --  '27 25 26  '$ GLUCOSE  --  102* 113* 84  BUN  --  '16 16 18  '$ CREATININE  --  1.20 1.17 1.17  CALCIUM  --  8.7* 8.5* 8.3*  MG 2.0  1.9  --   --   --   PROT  --  7.2  --   --   ALBUMIN  --  3.6  --   --   AST  --  21  --   --   ALT  --  12  --   --   ALKPHOS  --  63  --   --   BILITOT  --  1.0  --   --   GFRNONAA  --  >60 >60 >60  ANIONGAP  --  5 8 3*    Lipids No results  for input(s): "CHOL", "TRIG", "HDL", "LABVLDL", "LDLCALC", "CHOLHDL" in the last 168 hours.  Hematology Recent Labs  Lab 12/21/21 0758  WBC 7.5  RBC 4.55  HGB 14.0  HCT 41.5  MCV 91.2  MCH 30.8  MCHC 33.7  RDW 13.5  PLT 198   Thyroid No results for input(s): "TSH", "FREET4" in the last 168 hours.  BNPNo results for input(s): "BNP", "PROBNP" in the last 168 hours.  DDimer No results for input(s): "DDIMER" in the last 168 hours.   Radiology    ECHOCARDIOGRAM COMPLETE  Result Date: 12/21/2021    ECHOCARDIOGRAM REPORT   Patient Name:   Miguel Fletcher Date of Exam: 12/21/2021 Medical Rec #:  [id number]         Height:       69.0 in Accession #:    [id number]        Weight:       203.0 lb Date of Birth:  [birth year]         BSA:          2.079 m Patient Age:    66 years          BP:           167/73 mmHg Patient Gender: M                 HR:           67 bpm. Exam Location:  [facility] Procedure: 2D Echo, Cardiac Doppler, Color Doppler and Intracardiac            Opacification Agent Indications:     Elevated Troponin  History:         Patient has prior history of Echocardiogram examinations, most                  recent 11/12/2020. CHF, Previous Myocardial Infarction and CAD,                  Prior CABG; Risk Factors:Current Smoker, Hypertension and                  Dyslipidemia.  Sonographer:     Rosalia Hammers Referring Phys:  [id number]  AGBATA Diagnosing Phys: Nelva Bush MD IMPRESSIONS  1. Left ventricular ejection fraction, by estimation, is 60 to 65%. The left ventricle has normal function. The left ventricle has no regional wall motion abnormalities. There is severe left ventricular hypertrophy. Left ventricular diastolic parameters  are consistent with Grade I diastolic dysfunction (impaired relaxation). Elevated left atrial pressure.  2. Right ventricular systolic function is low normal. The right ventricular size is mildly enlarged.  3. Left atrial size was mildly dilated.  4. The mitral valve is degenerative. Mild mitral valve regurgitation.  5. The aortic valve is tricuspid. Aortic valve regurgitation is not visualized. No aortic stenosis is present.  6. Aortic dilatation noted. There is borderline dilatation of the aortic root, measuring 38 mm.  7. The inferior vena cava is normal in size with <50% respiratory variability, suggesting right atrial pressure of 8 mmHg. FINDINGS  Left Ventricle: Left ventricular ejection fraction, by estimation, is 60 to 65%. The left ventricle has normal function. The left ventricle has no regional wall motion abnormalities. Definity contrast agent was given IV to delineate the left ventricular  endocardial borders. The left ventricular internal cavity size was normal in size. There is severe left ventricular hypertrophy. Left ventricular diastolic parameters are consistent with Grade I diastolic dysfunction (impaired relaxation). Elevated left  atrial pressure. Right Ventricle: The right ventricular size is mildly enlarged. No increase in right ventricular wall thickness. Right ventricular systolic function is low normal. Left Atrium: Left atrial size was mildly dilated. Right Atrium: Right atrial size was normal in size. Pericardium: There is no evidence of pericardial effusion. Mitral Valve: The mitral valve is degenerative in appearance. Mild to moderate mitral annular calcification. Mild mitral  valve regurgitation. Tricuspid Valve: The tricuspid valve is normal in structure. Tricuspid valve regurgitation is mild. Aortic Valve: The aortic valve is tricuspid. Aortic valve regurgitation is not visualized. No aortic stenosis is present. Aortic valve mean gradient measures 6.0 mmHg. Aortic valve peak gradient measures 11.6 mmHg. Aortic valve area, by VTI measures 4.25  cm. Pulmonic Valve: The pulmonic valve was normal in structure. Pulmonic valve regurgitation is not visualized. No evidence of pulmonic stenosis. Aorta: Aortic dilatation noted. There is borderline dilatation of the aortic root, measuring 38 mm. Pulmonary Artery: The pulmonary artery is of normal size. Venous: The inferior vena cava is normal in size with less than 50% respiratory variability, suggesting right atrial pressure of 8 mmHg. IAS/Shunts: No atrial level shunt detected by color flow Doppler.  LEFT VENTRICLE PLAX 2D LVIDd:         4.10 cm   Diastology LVIDs:         2.30 cm   LV e' medial:    3.59 cm/s LV PW:         2.10 cm   LV E/e' medial:  23.6 LV IVS:        1.80 cm   LV e' lateral:   5.00 cm/s LVOT diam:     2.30 cm   LV E/e' lateral: 16.9 LV SV:         134 LV SV Index:   64 LVOT Area:     4.15 cm  RIGHT VENTRICLE RV Basal diam:  4.40 cm RV Mid diam:    3.20 cm RV S prime:     7.97 cm/s TAPSE (M-mode): 1.6 cm LEFT ATRIUM             Index        RIGHT ATRIUM           Index LA diam:        3.60 cm 1.73 cm/m   RA Area:     20.70 cm LA Vol (A2C):   92.9 ml 44.68 ml/m  RA Volume:   55.40 ml  26.65 ml/m LA Vol (A4C):   65.7 ml 31.60 ml/m LA Biplane Vol: 81.1 ml 39.01 ml/m  AORTIC VALVE AV Area (Vmax):    4.15 cm AV Area (Vmean):   3.97 cm AV Area (VTI):     4.25 cm AV Vmax:           170.00 cm/s AV Vmean:          112.000 cm/s AV VTI:            0.315 m AV Peak Grad:      11.6 mmHg AV Mean Grad:      6.0 mmHg LVOT Vmax:         170.00 cm/s LVOT Vmean:        107.000 cm/s LVOT VTI:          0.322 m LVOT/AV VTI ratio: 1.02   AORTA Ao Root diam: 3.80 cm Ao Asc diam:  3.50 cm MITRAL VALVE MV Area (PHT): 3.27 cm     SHUNTS MV Decel Time:  232 msec     Systemic VTI:  0.32 m MV E velocity: 84.60 cm/s   Systemic Diam: 2.30 cm MV A velocity: 114.00 cm/s MV E/A ratio:  0.74 Nelva Bush MD Electronically signed by Nelva Bush MD Signature Date/Time: 12/21/2021/5:38:49 PM    Final    DG Chest Portable 1 View  Result Date: 12/21/2021 CLINICAL DATA:  A 66 year old male presents for evaluation of chest pain. EXAM: PORTABLE CHEST 1 VIEW COMPARISON:  May 13, 2021 FINDINGS: EKG leads project over the chest. Heart size remains enlarged following median sternotomy for CABG. Hilar structures are stable. No consolidation. No pneumothorax or pleural effusion. On limited assessment no acute skeletal findings. IMPRESSION: Cardiomegaly without acute cardiopulmonary disease. Electronically Signed   By: Zetta Bills M.D.   On: 12/21/2021 08:22    Cardiac Studies   Echo 10/2020 1. Left ventricular ejection fraction, by estimation, is 55%. The left  ventricle has normal function. The left ventricle demonstrates regional  wall motion abnormalities (septal wall motion consistent with  postoperative state). There is severe left  ventricular hypertrophy.   2. Right ventricular systolic function is normal. The right ventricular  size is normal. There is normal pulmonary artery systolic pressure. The  estimated right ventricular systolic pressure is 19.1 mmHg.   3. Left atrial size was severely dilated.   4. The mitral valve is normal in structure. Mild mitral valve  regurgitation. No evidence of mitral stenosis.   5. Large left pleural effusion estimated >7 cm, up to 9 cm in select  images    Cardiac cath 09/2020   Dist LM to Prox LAD lesion is 70% stenosed.   Ramus lesion is 70% stenosed.   2nd Diag lesion is 60% stenosed.   Mid RCA lesion is 40% stenosed.   RPDA lesion is 50% stenosed.   RPAV lesion is 50% stenosed.   1.   Moderately severe, hemodynamically significant distal left main disease extending into the proximal LAD, confirmed by intravascular ultrasound assessment with minimal lumen area approximately 4 mm. 2.  Moderately severe eccentric stenosis of the ramus intermedius branch 3.  Patent left circumflex with mild nonobstructive disease 4.  Patent RCA with mild to moderate mid vessel stenosis and moderate stenosis involving the PDA and PLA branch vessels. 5.  Systemic hypertension   The patient has surgical coronary anatomy.  His coronary disease is clearly significant but not critical and it would be reasonable to treat him with medical therapy, control his blood pressure, allow him to recover from [place], and arrange outpatient cardiac surgical consultation for consideration of CABG.  Patient Profile     66 y.o. male hx of CAD s/p CABG in 10/2020, HTN, HLD, Bell's Palsey, and medication noncompliance who is being seen 12/21/2021 for the evaluation of chest pain.  Assessment & Plan    Chest pain CAD s/p CABG in 2022 - presented with nausea, vomiting, lightheadedness, sob and chest pain after taking his morning meds. Chest pain more atypical - no further chest pain reported - HS trop 90>109, seems to be chronically high - EKG unremarkable - echocardiogram showed LVEF 60-65%, no WMA, severe LVH, G1DD, mild MR, dilation of the aorta - plan for Myoview lexsican - continue Aspirin, Coreg, Imdur and Lipitor   Hypokalemia - K 2.4>2.6 - supplemental K ordered - daily BMET   HTN - patient is noncompliant with medication - Continue amlodipine '10mg'$  daily, Coreg 3.'125mg'$ BID, Imdur '60mg'$  daily, lisinopril '40mg'$  daily - BP high - restart Hydralazine '50mg'$   TID   HLD - LDL 93 - continue Lipitor '80mg'$  daily  For questions or updates, please contact [employer] Please consult [email or web address] for contact info under        Signed, Anis Degidio Ninfa Meeker, PA-C  12/22/2021, 7:34 AM

## 2021-12-22 NOTE — Progress Notes (Signed)
Patient refuses IVPB potassium because "it burns", educated patient about importance of getting potassium replaced due to low level if 2.6, but patient states "just give me the pill form". Notified Alexander, DO about refusal and request for PO

## 2021-12-22 NOTE — ED Notes (Signed)
Informed RN bed assigned 

## 2021-12-27 NOTE — Progress Notes (Unsigned)
Cardiology Clinic Note   Patient Name: Miguel Fletcher Date of Encounter: 12/27/2021  Primary Care Provider:  [facility] Cardiologist:  Ida Rogue, MD  Patient Profile    66 year old male with a history of CAD status post CABG in 10/2020, hypertension, hyperlipidemia, Bell's palsy, and medication noncompliance, who is here today for hospital follow-up.  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 08/2020 MRI [facility]): Well-circumscribed T1 hypointense, T2 hyperintense enhancing mass within the right axilla measuring 3.1 x 2.6 x 3.0 cm.  Mass extends along the course of the axillary neurovascular structures.  No evidence for invasion into the adjacent musculature.  No enlarged axillary lymph nodes.   Bell's palsy    Biliary colic    CAD (coronary artery disease)    a. 09/2020 NSTEMI/Cath: LM 60-70d, LAD 70p, D2 60, RI 70p, LCX ild diff dzs, RCA 40-31m RPDA 50, RPAV 50; b. 10/2020 CABG x 5 (LIMA->LAD, L radial->RI, VG->D1, VG->RPDA->RPL.   Diastolic dysfunction    a. 09/2020 Echo: EF 60-65%, no rwma, sev LVH. Gr1 DD. Nl RV size/fxn. Mod dil LA.   Hemorrhoids    Hypercholesterolemia    Hypertension    Stroke (cerebrum) ([facility]    Tobacco use    Past Surgical History:  Procedure Laterality Date   CORONARY ARTERY BYPASS GRAFT N/A 10/28/2020   Procedure: CORONARY ARTERY BYPASS GRAFTING (CABG), ON PUMP, TIMES FIVE, USING LEFT INTERNAL MAMMARY ARTERY, LEFT RADIAL ARTERY AND ENDOSCOPICALLY HARVESTED RIGHT GREATER SAPHENOUS VEIN;  Surgeon: HMelrose Nakayama MD;  [place] [facility]  Service: Open Heart Surgery;  Laterality: N/A;   ENDOVEIN HARVEST OF GREATER SAPHENOUS VEIN Right 10/28/2020   Procedure: ENDOVEIN HARVEST OF RIGHT GREATER SAPHENOUS VEIN;  Surgeon: HMelrose Nakayama MD;  [place]  Service: Open Heart Surgery;  Laterality: Right;   INTRAVASCULAR ULTRASOUND/IVUS N/A  10/06/2020   Procedure: Intravascular Ultrasound/IVUS;  Surgeon: [name] MD;  [facility];  Service: Cardiovascular;  Laterality: N/A;   IR THORACENTESIS ASP PLEURAL SPACE W/IMG GUIDE  11/12/2020   LEFT HEART CATH AND CORONARY ANGIOGRAPHY N/A 10/06/2020   Procedure: LEFT HEART CATH AND CORONARY ANGIOGRAPHY;  Surgeon: [name] MD;  [place] [facility];  Service: Cardiovascular;  Laterality: N/A;   RADIAL ARTERY HARVEST Left 10/28/2020   Procedure: LEFT RADIAL ARTERY HARVEST;  Surgeon: HMelrose Nakayama MD;  [place] [facility]  Service: Open Heart Surgery;  Laterality: Left;   TEE WITHOUT CARDIOVERSION N/A 10/28/2020   Procedure: TRANSESOPHAGEAL ECHOCARDIOGRAM (TEE);  Surgeon: HMelrose Nakayama MD;  [place] [facility]  Service: Open Heart Surgery;  Laterality: N/A;   VARICOSE VEIN SURGERY Left     Allergies  No Known Allergies  History of Present Illness    Miguel Fletcher a 66year old male who underwent CABG x5 on 10/2020.  Postprocedure complicated by thoracentesis x2 and acute heart failure.  Limited echocardiogram showed LVEF 55%, severe LVH, severely dilated LA, mild MR.  He was subsequently started on Lasix 20 mg daily.  [facility] blood pressures of 240s/110s.  He ran out of his medications.  He was treated and sent home with HCTZ, amlodipine, Coreg, lisinopril, and Imdur.  Lasix had to be held for hypokalemia.  [facility]  He took his medications within 5 minutes separate from lightheadedness, dizziness, chest pain, nausea vomiting.  Symptoms lasted for about 30 minutes.  Continue aspirin 81 mg x 4.  No recurrent symptoms.  Chest pain was a pressure-like sensation, has not felt the symptoms before.  No recurrent fever or chills.  Patient does not regularly take his medications, last time was 2 weeks  ago.  In the emergency department blood pressure 137/65, pulse 63, respiratory rate 23, high-sensitivity troponin 99 109, potassium 2.4, WBC 7.5, hemoglobin 14, serum creatinine 1.2, normal LFTs, chest x-ray showed cardiomegaly without acute disease, EKG showed normal sinus rhythm without ischemic changes.  Echocardiogram completed 12/20/2021 revealed LVEF 60-65%, severe left ventricular hypertrophy, G1 DD, mild mitral regurgitation, and aortic dilatation was noted borderline dilatation of the aortic root measuring 38 mm.  Stress testing completed which revealed consistence with no ischemia, study was low risk, LV perfusion is normal, left ventricular function was normal visually calculated at 45%, LAD and RCA calcifications noted, study quality degraded by diaphragmatic attenuation's.  He returns clinic today  Home Medications    Current Outpatient Medications  Medication Sig Dispense Refill   amLODipine (NORVASC) 10 MG tablet Take 1 tablet (10 mg total) by mouth daily. 90 tablet 3   aspirin EC 81 MG tablet Take 1 tablet (81 mg total) by mouth daily. Swallow whole. 90 tablet 3   atorvastatin (LIPITOR) 80 MG tablet Take 1 tablet (80 mg total) by mouth daily at 8 pm. 90 tablet 3   carvedilol (COREG) 3.125 MG tablet Take 1 tablet (3.125 mg total) by mouth 2 (two) times daily with a meal. 180 tablet 3   furosemide (LASIX) 20 MG tablet Take 1 tablet (20 mg total) by mouth daily. 90 tablet 3   hydrALAZINE (APRESOLINE) 100 MG tablet Take 0.5 tablets (50 mg total) by mouth 3 (three) times daily. 135 tablet 3   isosorbide mononitrate (IMDUR) 60 MG 24 hr tablet Take 1 tablet (60 mg total) by mouth daily. 90 tablet 3   lisinopril (ZESTRIL) 40 MG tablet Take 1 tablet (40 mg total) by mouth daily. 90 tablet 3   miconazole (MICOTIN) 2 % cream Apply 1 Application topically 2 (two) times daily. 28.35 g 3   omeprazole (PRILOSEC) 20 MG capsule Take 1 capsule (20 mg total) by mouth daily. 30 capsule 11    potassium chloride SA (KLOR-CON M) 20 MEQ tablet Take 1 tablet (20 mEq total) by mouth 2 (two) times daily for 5 days. 10 tablet 0   No current facility-administered medications for this visit.     Family History    Family History  Problem Relation Age of Onset   Alzheimer's disease Father    Heart disease Father    CAD Neg Hx    He indicated that his mother is deceased. He indicated that his father is deceased. He indicated that the status of his neg hx is unknown.  Social History    Social History   Socioeconomic History   Marital status: Single    Spouse name: Not on file   Number of children: Not on file   Years of education: Not on file   Highest education level: Not on file  Occupational History   Not on file  Tobacco Use   Smoking status: Some Days    Packs/day: 2.00    Years: 25.00    Total pack years: 50.00    Types: Cigarettes    Last attempt to quit: 11/06/2020  Years since quitting: 1.1   Smokeless tobacco: Never   Tobacco comments:    "I have smoked 4 cigs this week."  Vaping Use   Vaping Use: Never used  Substance and Sexual Activity   Alcohol use: No   Drug use: No   Sexual activity: Not on file  Other Topics Concern   Not on file  Social History Narrative   Not on file   Social Determinants of Health   Financial Resource Strain: Not on file  Food Insecurity: Unknown (12/22/2021)   Hunger Vital Sign    Worried About Running Out of Food in the Last Year: Patient refused    Ran Out of Food in the Last Year: Patient refused  Transportation Needs: Unknown (12/22/2021)   PRAPARE - Hydrologist (Medical): Patient refused    Lack of Transportation (Non-Medical): Patient refused  Physical Activity: Not on file  Stress: Not on file  Social Connections: Not on file  Intimate Partner Violence: Unknown (12/22/2021)   Humiliation, Afraid, Rape, and Kick questionnaire    Fear of Current or Ex-Partner: Patient refused     Emotionally Abused: Patient refused    Physically Abused: Patient refused    Sexually Abused: Patient refused     Review of Systems    General:  No chills, fever, night sweats or weight changes.  Cardiovascular:  No chest pain, dyspnea on exertion, edema, orthopnea, palpitations, paroxysmal nocturnal dyspnea. Dermatological: No rash, lesions/masses Respiratory: No cough, dyspnea Urologic: No hematuria, dysuria Abdominal:   No nausea, vomiting, diarrhea, bright red blood per rectum, melena, or hematemesis Neurologic:  No visual changes, wkns, changes in mental status. All other systems reviewed and are otherwise negative except as noted above.     Physical Exam    VS:  There were no vitals taken for this visit. , BMI There is no height or weight on file to calculate BMI.     GEN: Well nourished, well developed, in no acute distress. HEENT: normal. Neck: Supple, no JVD, carotid bruits, or masses. Cardiac: RRR, no murmurs, rubs, or gallops. No clubbing, cyanosis, edema.  Radials/DP/PT 2+ and equal bilaterally.  Respiratory:  Respirations regular and unlabored, clear to auscultation bilaterally. GI: Soft, nontender, nondistended, BS + x 4. MS: no deformity or atrophy. Skin: warm and dry, no rash. Neuro:  Strength and sensation are intact. Psych: Normal affect.  Accessory Clinical Findings    ECG personally reviewed by me today- *** - No acute changes  Lab Results  Component Value Date   WBC 7.5 12/21/2021   HGB 14.0 12/21/2021   HCT 41.5 12/21/2021   MCV 91.2 12/21/2021   PLT 198 12/21/2021   Lab Results  Component Value Date   CREATININE 1.17 12/22/2021   BUN 18 12/22/2021   NA 139 12/22/2021   K 3.3 (L) 12/22/2021   CL 110 12/22/2021   CO2 26 12/22/2021   Lab Results  Component Value Date   ALT 12 12/21/2021   AST 21 12/21/2021   ALKPHOS 63 12/21/2021   BILITOT 1.0 12/21/2021   Lab Results  Component Value Date   CHOL 72 (L) 11/09/2020   HDL 21 (L)  11/09/2020   LDLCALC 33 11/09/2020   LDLDIRECT 30 11/09/2020   TRIG 87 11/09/2020   CHOLHDL 3.4 11/09/2020    Lab Results  Component Value Date   HGBA1C 5.3 10/26/2020    Assessment & Plan   1.  ***  Pavan Bring, NP 12/27/2021, 9:22  AM    

## 2021-12-28 ENCOUNTER — Ambulatory Visit: Payer: Medicare Other | Attending: Cardiology | Admitting: Cardiology

## 2021-12-28 ENCOUNTER — Encounter: Payer: Self-pay | Admitting: Cardiology

## 2021-12-28 ENCOUNTER — Other Ambulatory Visit
Admission: RE | Admit: 2021-12-28 | Discharge: 2021-12-28 | Disposition: A | Discharge status: Home / Self Care | Payer: Medicare Other | Attending: Cardiology | Admitting: Cardiology

## 2021-12-28 VITALS — BP 190/90 | HR 68 | Ht 68.0 in | Wt 199.1 lb

## 2021-12-28 DIAGNOSIS — I5032 Chronic diastolic (congestive) heart failure: Secondary | ICD-10-CM | POA: Diagnosis not present

## 2021-12-28 DIAGNOSIS — E785 Hyperlipidemia, unspecified: Secondary | ICD-10-CM

## 2021-12-28 DIAGNOSIS — I10 Essential (primary) hypertension: Secondary | ICD-10-CM | POA: Diagnosis not present

## 2021-12-28 DIAGNOSIS — I2510 Atherosclerotic heart disease of native coronary artery without angina pectoris: Secondary | ICD-10-CM

## 2021-12-28 DIAGNOSIS — E876 Hypokalemia: Secondary | ICD-10-CM

## 2021-12-28 DIAGNOSIS — Z720 Tobacco use: Secondary | ICD-10-CM

## 2021-12-28 LAB — BASIC METABOLIC PANEL
Anion gap: 6 (ref 5–15)
BUN: 14 mg/dL (ref 8–23)
CO2: 30 mmol/L (ref 22–32)
Calcium: 9.1 mg/dL (ref 8.9–10.3)
Chloride: 105 mmol/L (ref 98–111)
Creatinine, Ser: 1.06 mg/dL (ref 0.61–1.24)
GFR, Estimated: >60 mL/min (ref >60)
Glucose, Bld: 79 mg/dL (ref 70–99)
Potassium: 3.4 mmol/L — ABNORMAL LOW (ref 3.5–5.1)
Sodium: 141 mmol/L (ref 135–145)

## 2021-12-28 MED ORDER — HYDRALAZINE HCL 100 MG PO TABS: 100.0000 mg | ORAL_TABLET | Freq: Two times a day (BID) | ORAL | 3 refills | Status: DC

## 2021-12-28 NOTE — Patient Instructions (Signed)
Medication Instructions:   Your physician recommends that you continue on your current medications as directed. Please refer to the Current Medication list given to you today.  *If you need a refill on your cardiac medications before your next appointment, please call your pharmacy*   Lab Work:  Your physician recommends you go to the medical mall to have lab work done.   If you have labs (blood work) drawn today and your tests are completely normal, you will receive your results only by: [place] (if you have MyChart) OR A paper copy in the mail If you have any lab test that is abnormal or we need to change your treatment, we will call you to review the results.   Testing/Procedures:  None Ordered   Follow-Up: At [facility], you and your health needs are our priority.  As part of our continuing mission to provide you with exceptional heart care, we have created designated Provider Care Teams.  These Care Teams include your primary Cardiologist (physician) and Advanced Practice Providers (APPs -  Physician Assistants and Nurse Practitioners) who all work together to provide you with the care you need, when you need it.  We recommend signing up for the patient portal called "MyChart".  Sign up information is provided on this After Visit Summary.  MyChart is used to connect with patients for Virtual Visits (Telemedicine).  Patients are able to view lab/test results, encounter notes, upcoming appointments, etc.  Non-urgent messages can be sent to your provider as well.   To learn more about what you can do with MyChart, go to [email or web address].    Your next appointment:   2 week(s)  The format for your next appointment:   In Person  Provider:   You may see Ida Rogue, MD or one of the following Advanced Practice Providers on your designated Care Team:   Murray Hodgkins, NP Christell Faith, PA-C Cadence Kathlen Mody, PA-C Gerrie Nordmann, NP

## 2021-12-28 NOTE — Progress Notes (Signed)
Potassium level has improved. Continue potassium supplements for another week and then stop. Recheck BMP in 1 month

## 2021-12-31 ENCOUNTER — Other Ambulatory Visit: Payer: Self-pay | Admitting: *Deleted

## 2021-12-31 DIAGNOSIS — I2510 Atherosclerotic heart disease of native coronary artery without angina pectoris: Secondary | ICD-10-CM

## 2021-12-31 DIAGNOSIS — I5032 Chronic diastolic (congestive) heart failure: Secondary | ICD-10-CM

## 2021-12-31 DIAGNOSIS — E876 Hypokalemia: Secondary | ICD-10-CM

## 2022-01-12 NOTE — Progress Notes (Unsigned)
Cardiology Clinic Note   Patient Name: Miguel Fletcher Date of Encounter: 01/13/2022  Primary Care Provider:  [facility] [place] Primary Cardiologist:  Ida Rogue, MD  Patient Profile    66 year old male with a history of coronary artery disease status post CABG in 10/2020, hypertension, hyperlipidemia, Bell's palsy, and medication noncompliance, who is here today to follow-up on his hypertension.  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 08/2020 MRI [facility]): Well-circumscribed T1 hypointense, T2 hyperintense enhancing mass within the right axilla measuring 3.1 x 2.6 x 3.0 cm.  Mass extends along the course of the axillary neurovascular structures.  No evidence for invasion into the adjacent musculature.  No enlarged axillary lymph nodes.   Bell's palsy    Biliary colic    CAD (coronary artery disease)    a. 09/2020 NSTEMI/Cath: LM 60-70d, LAD 70p, D2 60, RI 70p, LCX ild diff dzs, RCA 40-43m RPDA 50, RPAV 50; b. 10/2020 CABG x 5 (LIMA->LAD, L radial->RI, VG->D1, VG->RPDA->RPL.   Diastolic dysfunction    a. 09/2020 Echo: EF 60-65%, no rwma, sev LVH. Gr1 DD. Nl RV size/fxn. Mod dil LA.   Hemorrhoids    Hypercholesterolemia    Hypertension    [place]    Tobacco use    Past Surgical History:  Procedure Laterality Date   CORONARY ARTERY BYPASS GRAFT N/A 10/28/2020   Procedure: CORONARY ARTERY BYPASS GRAFTING (CABG), ON PUMP, TIMES FIVE, USING LEFT INTERNAL MAMMARY ARTERY, LEFT RADIAL ARTERY AND ENDOSCOPICALLY HARVESTED RIGHT GREATER SAPHENOUS VEIN;  Surgeon: HMelrose Nakayama MD;  [facility]  Service: Open Heart Surgery;  Laterality: N/A;   ENDOVEIN HARVEST OF GREATER SAPHENOUS VEIN Right 10/28/2020   Procedure: ENDOVEIN HARVEST OF RIGHT GREATER SAPHENOUS VEIN;  Surgeon: HMelrose Nakayama MD;  [facility]  Service: Open Heart Surgery;  Laterality: Right;    INTRAVASCULAR ULTRASOUND/IVUS N/A 10/06/2020   Procedure: Intravascular Ultrasound/IVUS;  Surgeon: [name] MD;  [facility];  Service: Cardiovascular;  Laterality: N/A;   IR THORACENTESIS ASP PLEURAL SPACE W/IMG GUIDE  11/12/2020   LEFT HEART CATH AND CORONARY ANGIOGRAPHY N/A 10/06/2020   Procedure: LEFT HEART CATH AND CORONARY ANGIOGRAPHY;  Surgeon: [name] MD;  [facility];  Service: Cardiovascular;  Laterality: N/A;   RADIAL ARTERY HARVEST Left 10/28/2020   Procedure: LEFT RADIAL ARTERY HARVEST;  Surgeon: HMelrose Nakayama MD;  [place] [facility]  Service: Open Heart Surgery;  Laterality: Left;   TEE WITHOUT CARDIOVERSION N/A 10/28/2020   Procedure: TRANSESOPHAGEAL ECHOCARDIOGRAM (TEE);  Surgeon: HMelrose Nakayama MD;  [facility]  Service: Open Heart Surgery;  Laterality: N/A;   VARICOSE VEIN SURGERY Left     Allergies  No Known Allergies  History of Present Illness    WRonnel Fletcher  PPalardyis a 66year old male who underwent CABG x5 on 10/2020.  Postprocedure was complicated by the need for thoracentesis x2 and acute heart failure.  Limited echocardiogram showed LVEF 55%, severe LVH, severely dilated LA, mild MR.  He was subsequently started on Lasix 20 mg daily.  He has not a history of hypertension, hyperlipidemia, Bell's palsy, medication nonadherence.   [facility] blood pressures 240s over 110s.  He ran out of his medications according to the patient.  He was treated and  sent home with HCTZ, amlodipine, Coreg, lisinopril, and Imdur.  Lasix was on hold for hypokalemia.  He was evaluated in the [facility] emergency department 01/07/2022 for chest pain.  Once he taken all of his medications he suffered from lightheadedness, dizziness, chest pain, nausea without vomiting.  Symptoms lasted for approximately 30 minutes.  He was given aspirin 81 mg x 4 with no  recurrent symptoms.  Chest pain was pressure-like sensation has not felt the symptoms prior.  Echocardiogram completed 12/20/2021 with an LVEF of 60-65%, severe left ventricular hypertrophy, G1 DD, mild mitral regurgitation, and aortic dilatation was noted measuring 38 mm.  Stress testing was completed which revealed no ischemia but showed LAD and RCA calcifications.  He was last seen in clinic on 12/28/2021 and had been out of his medications since Friday because he was having abdominal pain with nausea and vomiting and then started feeling better until on Saturday so he had not picked up any of his medications and started taking them at that time.  At the time of his visit his blood pressure was 190/90 and after much discussion he had agreed to take his amlodipine 10 mg in the evening restart his hydralazine 100 mg twice daily and continue his lisinopril 40 mg daily.  He returns to clinic today stating that he is feeling fine.  Unfortunately his blood pressure remains to be elevated.  He has not had any of his medications as of yet today.  We have advised Miguel Fletcher it is important for him to take his medications and then come to his appointment so we can determine how his blood pressures are actually running.  He denies any chest pain, shortness of breath, dyspnea on exertion, palpitations, peripheral edema.  Denies any emergency department visits or recent hospitalizations.  Home Medications    Current Outpatient Medications  Medication Sig Dispense Refill   amLODipine (NORVASC) 10 MG tablet Take 1 tablet (10 mg total) by mouth daily. 90 tablet 3   aspirin EC 81 MG tablet Take 1 tablet (81 mg total) by mouth daily. Swallow whole. 90 tablet 3   atorvastatin (LIPITOR) 80 MG tablet Take 1 tablet (80 mg total) by mouth daily at 8 pm. 90 tablet 3   hydrALAZINE (APRESOLINE) 100 MG tablet Take 1 tablet (100 mg total) by mouth 2 (two) times daily. 135 tablet 3   lisinopril (ZESTRIL) 40 MG tablet Take 1  tablet (40 mg total) by mouth daily. 90 tablet 3   No current facility-administered medications for this visit.     Family History    Family History  Problem Relation Age of Onset   Alzheimer's disease Father    Heart disease Father    CAD Neg Hx    He indicated that his mother is deceased. He indicated that his father is deceased. He indicated that the status of his neg hx is unknown.  Social History    Social History   Socioeconomic History   Marital status: Single    Spouse name: Not on file   Number of children: Not on file   Years of education: Not on file   Highest education level: Not on file  Occupational History   Not on file  Tobacco Use   Smoking status: Some Days    Packs/day: 2.00    Years: 25.00    Total pack years: 50.00    Types: Cigarettes    Last attempt to quit: 11/06/2020    Years since quitting: 1.1  Smokeless tobacco: Never   Tobacco comments:    "I have smoked 4 cigs this week."  Vaping Use   Vaping Use: Never used  Substance and Sexual Activity   Alcohol use: No   Drug use: No   Sexual activity: Not on file  Other Topics Concern   Not on file  Social History Narrative   Not on file   Social Determinants of Health   Financial Resource Strain: Not on file  Food Insecurity: Unknown (12/22/2021)   Hunger Vital Sign    Worried About Running Out of Food in the Last Year: Patient refused    [place] in the Last Year: Patient refused  Transportation Needs: Unknown (12/22/2021)   PRAPARE - Hydrologist (Medical): Patient refused    Lack of Transportation (Non-Medical): Patient refused  Physical Activity: Not on file  Stress: Not on file  Social Connections: Not on file  Intimate Partner Violence: Unknown (12/22/2021)   Humiliation, Afraid, Rape, and Kick questionnaire    Fear of Current or Ex-Partner: Patient refused    Emotionally Abused: Patient refused    Physically Abused: Patient refused     Sexually Abused: Patient refused     Review of Systems    General:  No chills, fever, night sweats or weight changes.  Cardiovascular:  No chest pain, dyspnea on exertion, edema, orthopnea, palpitations, paroxysmal nocturnal dyspnea. Dermatological: No rash, lesions/masses Respiratory: No cough, dyspnea Urologic: No hematuria, dysuria Abdominal:   No nausea, vomiting, diarrhea, bright red blood per rectum, melena, or hematemesis Neurologic:  No visual changes, wkns, changes in mental status. All other systems reviewed and are otherwise negative except as noted above.   Physical Exam    VS:  BP (!) 200/100 (BP Location: Left Arm, Patient Position: Sitting, Cuff Size: Normal)   Pulse 64   Ht '5\' 9"'$  (1.753 m)   Wt 200 lb 12.8 oz (91.1 kg)   SpO2 98%   BMI 29.65 kg/m  , BMI Body mass index is 29.65 kg/m.     Vitals:   01/13/22 0759 01/13/22 0810  BP: (!) 218/110 (!) 200/100    GEN: Well nourished, well developed, in no acute distress. HEENT: normal. Neck: Supple, no JVD, carotid bruits, or masses. Cardiac: RRR, no murmurs, rubs, or gallops. No clubbing, cyanosis, edema.  Radials/DP/PT 2+ and equal bilaterally.  Respiratory:  Respirations regular and unlabored, clear to auscultation bilaterally. GI: Soft, nontender, nondistended, BS + x 4. MS: no deformity or atrophy. Skin: warm and dry, no rash. Neuro:  Strength and sensation are intact. Psych: Normal affect.  Accessory Clinical Findings    ECG personally reviewed by me today-no new tracings were completed today  Lab Results  Component Value Date   WBC 7.5 12/21/2021   HGB 14.0 12/21/2021   HCT 41.5 12/21/2021   MCV 91.2 12/21/2021   PLT 198 12/21/2021   Lab Results  Component Value Date   CREATININE 1.06 12/28/2021   BUN 14 12/28/2021   NA 141 12/28/2021   K 3.4 (L) 12/28/2021   CL 105 12/28/2021   CO2 30 12/28/2021   Lab Results  Component Value Date   ALT 12 12/21/2021   AST 21 12/21/2021   ALKPHOS 63  12/21/2021   BILITOT 1.0 12/21/2021   Lab Results  Component Value Date   CHOL 72 (L) 11/09/2020   HDL 21 (L) 11/09/2020   LDLCALC 33 11/09/2020   LDLDIRECT 30 11/09/2020   TRIG  87 11/09/2020   CHOLHDL 3.4 11/09/2020    Lab Results  Component Value Date   HGBA1C 5.3 10/26/2020    Assessment & Plan   1.  Coronary artery disease status post CABG x5 in 2022.  Recent MPI which revealed no ischemia or infarct.  He remains chest pain free today.  He is continued on aspirin 81 mg daily, atorvastatin 80 mg daily.  He continues to refuse to carvedilol and Imdur.  2.  Uncontrolled hypertension with blood pressure today of 218/110 recheck was 200/100.  Unfortunately he has yet to have any of his medications at this time.  Patient is agreeable to take his lisinopril and hydralazine in the a.m. and then his amlodipine and additional hydralazine in the p.m.  This was written down to left discharge instructions from the clinic today to allow for him to get his pillboxes situated.  Has also been advised of actually taking his medication and the need for medication adherence and the risk of stroke and MI with elevated blood pressures.  3.  Hyperlipidemia with LDL 93.  He continues to take his atorvastatin 80 mg at night.  4.  Tobacco abuse with cessation recommended  5.  Disposition patient return to clinic to see MD/APP in 1 to 2 weeks or sooner if needed after he has been taking her blood pressure medications he has also been given instructions to report to the emergency room for strokelike symptoms or chest pains.  Tarini Carrier, NP 01/13/2022, 8:13 AM

## 2022-01-13 ENCOUNTER — Ambulatory Visit: Payer: Medicare Other | Attending: Cardiology | Admitting: Cardiology

## 2022-01-13 ENCOUNTER — Encounter: Payer: Self-pay | Admitting: Cardiology

## 2022-01-13 VITALS — BP 200/100 | HR 64 | Ht 69.0 in | Wt 200.8 lb

## 2022-01-13 DIAGNOSIS — I2510 Atherosclerotic heart disease of native coronary artery without angina pectoris: Secondary | ICD-10-CM | POA: Diagnosis present

## 2022-01-13 DIAGNOSIS — Z720 Tobacco use: Secondary | ICD-10-CM | POA: Insufficient documentation

## 2022-01-13 DIAGNOSIS — I10 Essential (primary) hypertension: Secondary | ICD-10-CM | POA: Diagnosis present

## 2022-01-13 DIAGNOSIS — E785 Hyperlipidemia, unspecified: Secondary | ICD-10-CM | POA: Diagnosis present

## 2022-01-13 NOTE — Patient Instructions (Addendum)
Medication Instructions:  Take Lisinopril '40mg'$  every morning Take Hydralazine '100mg'$ - (1/2) tablet in the morning Take additional 1/2 tablet in the evening/night Take Amlodipine '10mg'$  each night *If you need a refill on your cardiac medications before your next appointment, please call your pharmacy*   Lab Work: NONE If you have labs (blood work) drawn today and your tests are completely normal, you will receive your results only by: [place] (if you have MyChart) OR A paper copy in the mail If you have any lab test that is abnormal or we need to change your treatment, we will call you to review the results.   Testing/Procedures: NONE   Follow-Up: At [facility], you and your health needs are our priority.  As part of our continuing mission to provide you with exceptional heart care, we have created designated Provider Care Teams.  These Care Teams include your primary Cardiologist (physician) and Advanced Practice Providers (APPs -  Physician Assistants and Nurse Practitioners) who all work together to provide you with the care you need, when you need it.  We recommend signing up for the patient portal called "MyChart".  Sign up information is provided on this After Visit Summary.  MyChart is used to connect with patients for Virtual Visits (Telemedicine).  Patients are able to view lab/test results, encounter notes, upcoming appointments, etc.  Non-urgent messages can be sent to your provider as well.   To learn more about what you can do with MyChart, go to [email or web address].    Your next appointment:   2 week(s)  The format for your next appointment:   In Person  Provider:   You will see one of the following Advanced Practice Providers on your designated Care Team:   Murray Hodgkins, NP Christell Faith, PA-C Cadence Kathlen Mody, PA-C Gerrie Nordmann, NP     Important Information About Sugar

## 2022-01-24 NOTE — Progress Notes (Unsigned)
Cardiology Clinic Note   Patient Name: Miguel Fletcher Date of Encounter: 01/25/2022  Primary Care Provider:  [facility] [place] Primary Cardiologist:  Ida Rogue, MD  Patient Profile    66 year old male with a history of coronary artery disease status post CABG in 10/2020, hypertension, hyperlipidemia, Bell's palsy, medication noncompliance who presents today for follow-up on hypertension after recent medication changes have been made.  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 08/2020 MRI [facility]): Well-circumscribed T1 hypointense, T2 hyperintense enhancing mass within the right axilla measuring 3.1 x 2.6 x 3.0 cm.  Mass extends along the course of the axillary neurovascular structures.  No evidence for invasion into the adjacent musculature.  No enlarged axillary lymph nodes.   Bell's palsy    Biliary colic    CAD (coronary artery disease)    a. 09/2020 NSTEMI/Cath: LM 60-70d, LAD 70p, D2 60, RI 70p, LCX ild diff dzs, RCA 40-67m RPDA 50, RPAV 50; b. 10/2020 CABG x 5 (LIMA->LAD, L radial->RI, VG->D1, VG->RPDA->RPL.   Diastolic dysfunction    a. 09/2020 Echo: EF 60-65%, no rwma, sev LVH. Gr1 DD. Nl RV size/fxn. Mod dil LA.   Hemorrhoids    Hypercholesterolemia    Hypertension    [place]    Tobacco use    Past Surgical History:  Procedure Laterality Date   CORONARY ARTERY BYPASS GRAFT N/A 10/28/2020   Procedure: CORONARY ARTERY BYPASS GRAFTING (CABG), ON PUMP, TIMES FIVE, USING LEFT INTERNAL MAMMARY ARTERY, LEFT RADIAL ARTERY AND ENDOSCOPICALLY HARVESTED RIGHT GREATER SAPHENOUS VEIN;  Surgeon: HMelrose Nakayama MD;  [place] [facility]  Service: Open Heart Surgery;  Laterality: N/A;   ENDOVEIN HARVEST OF GREATER SAPHENOUS VEIN Right 10/28/2020   Procedure: ENDOVEIN HARVEST OF RIGHT GREATER SAPHENOUS VEIN;  Surgeon: HMelrose Nakayama MD;  [place] [facility]  Service: Open Heart  Surgery;  Laterality: Right;   INTRAVASCULAR ULTRASOUND/IVUS N/A 10/06/2020   Procedure: Intravascular Ultrasound/IVUS;  Surgeon: [name] MD;  [place];  Service: Cardiovascular;  Laterality: N/A;   IR THORACENTESIS ASP PLEURAL SPACE W/IMG GUIDE  11/12/2020   LEFT HEART CATH AND CORONARY ANGIOGRAPHY N/A 10/06/2020   Procedure: LEFT HEART CATH AND CORONARY ANGIOGRAPHY;  Surgeon: [name] MD;  [place] [facility];  Service: Cardiovascular;  Laterality: N/A;   RADIAL ARTERY HARVEST Left 10/28/2020   Procedure: LEFT RADIAL ARTERY HARVEST;  Surgeon: HMelrose Nakayama MD;  [place] [facility]  Service: Open Heart Surgery;  Laterality: Left;   TEE WITHOUT CARDIOVERSION N/A 10/28/2020   Procedure: TRANSESOPHAGEAL ECHOCARDIOGRAM (TEE);  Surgeon: HMelrose Nakayama MD;  [place]  Service: Open Heart Surgery;  Laterality: N/A;   VARICOSE VEIN SURGERY Left     Allergies  No Known Allergies  History of Present Illness    Miguel Fletcher a 66year old male underwent CABG x5 on 10/2020.  Postprocedure was complicated by the need for thoracentesis x2 and acute heart failure.  Limited echocardiogram showed LVEF 55%, severe LVH, severely dilated LA, mild MR.  He was subsequently started on Lasix 20 mg daily.  He has a history of hypertension, hyperlipidemia, Bell's palsy, medication nonadherence.  [facility] urgency blood pressures 240s over 1 teens.  He ran out of his medications according to the patient.  He was treated  and sent home with HCTZ, amlodipine, Coreg, lisinopril, and Imdur.  Lasix was on hold for hypokalemia.  He had an echocardiogram completed 12/20/2021 with an LVEF of 60 to 65%, severe left ventricular perjury, G1 DD, mild mitral regurgitation, and aortic dilatation was noted measuring 38 mm.  Stress testing was completed which revealed no ischemia but showed  LAD and RCA calcifications.  He was last seen in clinic on 01/13/2022 and continued to have elevated blood pressures.  At that time he was agreeable to take his lisinopril and hydralazine in the a.m. and his amlodipine and additional hydralazine in the p.m.  He was also giving all of his medications and when to take them written down to prevent any confusion.  He was reeducated on medication adherence and the risk for stroke and MI with his elevated blood pressures.  He returns to clinic today stating that he feels horrible.  States that he has had changes with his vision and is scheduled an appointment with an ophthalmologist.  States that he has weakness, facial drooping, worsening fatigue.  He states that he has been taking his medications as prescribed but would like to come off of some of them today as he feels horrible and feels like his medications is making him have these feelings.  He also states that he feels like his Bell's palsy is starting to kick in as he has noted changes in the ability to open his eye on the right side, tingling to his face on that side, and a change in the sensation to his tongue.  He has a history of following up with [facility] for the symptoms as well.  He brought his pill boxes with him today as well as his medication list.  Denies any hospitalizations or visits to the emergency department.   Home Medications    Current Outpatient Medications  Medication Sig Dispense Refill   amLODipine (NORVASC) 10 MG tablet Take 1 tablet (10 mg total) by mouth daily. 90 tablet 3   aspirin EC 81 MG tablet Take 1 tablet (81 mg total) by mouth daily. Swallow whole. 90 tablet 3   atorvastatin (LIPITOR) 80 MG tablet Take 1 tablet (80 mg total) by mouth daily at 8 pm. 90 tablet 3   hydrALAZINE (APRESOLINE) 100 MG tablet Take 1 tablet (100 mg total) by mouth 2 (two) times daily. 135 tablet 3   lisinopril (ZESTRIL) 40 MG tablet Take 1 tablet (40 mg total) by mouth daily. 90  tablet 3   No current facility-administered medications for this visit.     Family History    Family History  Problem Relation Age of Onset   Alzheimer's disease Father    Heart disease Father    CAD Neg Hx    He indicated that his mother is deceased. He indicated that his father is deceased. He indicated that the status of his neg hx is unknown.  Social History    Social History   Socioeconomic History   Marital status: Single    Spouse name: Not on file   Number of children: Not on file   Years of education: Not on file   Highest education level: Not on file  Occupational History   Not on file  Tobacco Use   Smoking status: Every Day    Packs/day: 2.00    Years: 25.00    Total pack years: 50.00    Types: Cigarettes    Last attempt to quit: 11/06/2020  Years since quitting: 1.2   Smokeless tobacco: Never   Tobacco comments:    "I have smoked 4 cigs this week."  Vaping Use   Vaping Use: Never used  Substance and Sexual Activity   Alcohol use: No   Drug use: No   Sexual activity: Not on file  Other Topics Concern   Not on file  Social History Narrative   Not on file   Social Determinants of Health   Financial Resource Strain: Not on file  Food Insecurity: Unknown (12/22/2021)   Hunger Vital Sign    Worried About Running Out of Food in the Last Year: Patient refused    Ran Out of Food in the Last Year: Patient refused  Transportation Needs: Unknown (12/22/2021)   PRAPARE - Hydrologist (Medical): Patient refused    Lack of Transportation (Non-Medical): Patient refused  Physical Activity: Not on file  Stress: Not on file  Social Connections: Not on file  Intimate Partner Violence: Unknown (12/22/2021)   Humiliation, Afraid, Rape, and Kick questionnaire    Fear of Current or Ex-Partner: Patient refused    Emotionally Abused: Patient refused    Physically Abused: Patient refused    Sexually Abused: Patient refused     Review  of Systems    General:  No chills, fever, night sweats or weight changes.  Endorses fatigue Cardiovascular:  No chest pain, endorses dyspnea on exertion, edema, orthopnea, endorses palpitations, paroxysmal nocturnal dyspnea. Dermatological: No rash, lesions/masses Respiratory: No cough, endorses dyspnea Urologic: No hematuria, dysuria Abdominal:   No nausea, vomiting, diarrhea, bright red blood per rectum, melena, or hematemesis Neurologic: Endorses visual changes, wkns, but denies changes in mental status.  Endorses lightheadedness and dizziness All other systems reviewed and are otherwise negative except as noted above.   Physical Exam    VS:  BP (!) 173/81 (BP Location: Left Arm, Patient Position: Sitting, Cuff Size: Normal)   Pulse 64   Ht '5\' 9"'$  (1.753 m)   Wt 202 lb 6 oz (91.8 kg)   SpO2 96%   BMI 29.89 kg/m  , BMI Body mass index is 29.89 kg/m.     Orthostatic VS for the past 24 hrs:  BP- Lying Pulse- Lying BP- Sitting Pulse- Sitting BP- Standing at 0 minutes Pulse- Standing at 0 minutes  01/25/22 1102 144/78 61 139/73 65 124/72 67   GEN: Well nourished, well developed, in no acute distress. HEENT: normal. Neck: Supple, no JVD, carotid bruits, or masses. Cardiac: RRR, no murmurs, rubs, or gallops. No clubbing, cyanosis, edema.  Radials/DP/PT 2+ and equal bilaterally.  Respiratory:  Respirations regular and unlabored, clear to auscultation bilaterally. GI: Soft, nontender, nondistended, BS + x 4. MS: no deformity or atrophy. Skin: warm and dry, no rash. Neuro:  Strength and sensation are intact. Psych: Normal affect.  Accessory Clinical Findings    ECG personally reviewed by me today-sinus rhythm with a rate of 64 with first-degree AV block, premature atrial complexes, left bundle branch block, left anterior fascicular block- No acute changes  Lab Results  Component Value Date   WBC 7.5 12/21/2021   HGB 14.0 12/21/2021   HCT 41.5 12/21/2021   MCV 91.2 12/21/2021    PLT 198 12/21/2021   Lab Results  Component Value Date   CREATININE 1.06 12/28/2021   BUN 14 12/28/2021   NA 141 12/28/2021   K 3.4 (L) 12/28/2021   CL 105 12/28/2021   CO2 30 12/28/2021   Lab Results  Component Value Date   ALT 12 12/21/2021   AST 21 12/21/2021   ALKPHOS 63 12/21/2021   BILITOT 1.0 12/21/2021   Lab Results  Component Value Date   CHOL 72 (L) 11/09/2020   HDL 21 (L) 11/09/2020   LDLCALC 33 11/09/2020   LDLDIRECT 30 11/09/2020   TRIG 87 11/09/2020   CHOLHDL 3.4 11/09/2020    Lab Results  Component Value Date   HGBA1C 5.3 10/26/2020    Assessment & Plan   1.  Coronary artery disease status post CABG x5 in 2022.  Recent MPI revealed no ischemia or infarct.  He remains chest pain-free today.  He is continued on aspirin and atorvastatin.  He has refused carvedilol and Imdur.  EKG done today reveals sinus rhythm with first-degree AV block left bundle branch block and left anterior fascicular block left bundle is not new.  2.  Hypertension with orthostatic hypotension noted today.  With complaints of lightheadedness dizziness visual changes and questionable Bell's palsy symptoms he had orthostatic vitals that were completed that revealed that he did have some orthostatic changes noted in his blood pressure with positional changes as he went from lying to standing and had systolic 1 94-8 24 which is the 20 mmHg change.  He is also requesting to come off of all of his blood pressure medications we have decided today that we would take him off of the a.m. hydralazine as he believes that that is what is causing the majority of his symptoms he will continue with p.m. dosing and continue the rest of his medications.  3.  Previous diagnoses of hypokalemia no longer taking furosemide or potassium supplementations.  He has been sent for BMP today to reevaluate his electrolyte and kidney function.  As well as a CBC to evaluate for elevated WBCs.  4.  Hyperlipidemia with LDL of  93.  He has been continued on atorvastatin 80 mg nightly.  5.  Tobacco abuse with stating that he had previously quit and has been smoking again.  Total cessation is recommended.  6.  Disposition patient to return to clinic to see MD/APP in 3 to 4 weeks or sooner if needed to allow him to follow-up with his PCP and his ophthalmologist with his visual changes.  He is also been advised with continued symptoms are uncontrolled hypertension to report to the emergency department.  Boluwatife Flight, NP 01/25/2022, 11:21 AM

## 2022-01-25 ENCOUNTER — Ambulatory Visit: Payer: Medicare Other | Attending: Cardiology | Admitting: Cardiology

## 2022-01-25 ENCOUNTER — Encounter: Payer: Self-pay | Admitting: Cardiology

## 2022-01-25 ENCOUNTER — Other Ambulatory Visit
Admission: RE | Admit: 2022-01-25 | Discharge: 2022-01-25 | Disposition: A | Discharge status: Home / Self Care | Payer: Medicare Other | Source: Ambulatory Visit | Attending: Cardiology | Admitting: Cardiology

## 2022-01-25 ENCOUNTER — Telehealth: Payer: Self-pay | Admitting: *Deleted

## 2022-01-25 VITALS — BP 173/81 | HR 64 | Ht 69.0 in | Wt 202.4 lb

## 2022-01-25 DIAGNOSIS — Z79899 Other long term (current) drug therapy: Secondary | ICD-10-CM | POA: Insufficient documentation

## 2022-01-25 DIAGNOSIS — I10 Essential (primary) hypertension: Secondary | ICD-10-CM | POA: Diagnosis not present

## 2022-01-25 DIAGNOSIS — Z720 Tobacco use: Secondary | ICD-10-CM | POA: Diagnosis present

## 2022-01-25 DIAGNOSIS — E876 Hypokalemia: Secondary | ICD-10-CM

## 2022-01-25 DIAGNOSIS — I2510 Atherosclerotic heart disease of native coronary artery without angina pectoris: Secondary | ICD-10-CM | POA: Insufficient documentation

## 2022-01-25 DIAGNOSIS — I5032 Chronic diastolic (congestive) heart failure: Secondary | ICD-10-CM | POA: Insufficient documentation

## 2022-01-25 DIAGNOSIS — E785 Hyperlipidemia, unspecified: Secondary | ICD-10-CM | POA: Diagnosis not present

## 2022-01-25 LAB — BASIC METABOLIC PANEL
Anion gap: 6 (ref 5–15)
BUN: 15 mg/dL (ref 8–23)
CO2: 28 mmol/L (ref 22–32)
Calcium: 9.1 mg/dL (ref 8.9–10.3)
Chloride: 104 mmol/L (ref 98–111)
Creatinine, Ser: 1.17 mg/dL (ref 0.61–1.24)
GFR, Estimated: >60 mL/min (ref >60)
Glucose, Bld: 69 mg/dL — ABNORMAL LOW (ref 70–99)
Potassium: 2.9 mmol/L — ABNORMAL LOW (ref 3.5–5.1)
Sodium: 138 mmol/L (ref 135–145)

## 2022-01-25 LAB — CBC
HCT: 42.2 % (ref 39.0–52.0)
Hemoglobin: 14.1 g/dL (ref 13.0–17.0)
MCH: 30.5 pg (ref 26.0–34.0)
MCHC: 33.4 g/dL (ref 30.0–36.0)
MCV: 91.3 fL (ref 80.0–100.0)
Platelets: 196 10*3/uL (ref 150–400)
RBC: 4.62 MIL/uL (ref 4.22–5.81)
RDW: 13.4 % (ref 11.5–15.5)
WBC: 5.3 10*3/uL (ref 4.0–10.5)
nRBC: 0 % (ref 0.0–0.2)

## 2022-01-25 MED ORDER — POTASSIUM CHLORIDE CRYS ER 20 MEQ PO TBCR: 20.0000 meq | EXTENDED_RELEASE_TABLET | Freq: Every day | ORAL | 3 refills | Status: DC

## 2022-01-25 MED ORDER — HYDRALAZINE HCL 100 MG PO TABS: ORAL_TABLET | ORAL | 3 refills | Status: DC

## 2022-01-25 NOTE — Patient Instructions (Signed)
Medication Instructions:  - Your physician has recommended you make the following change in your medication:   1) STOP your morning dose of hydralazine  *If you need a refill on your cardiac medications before your next appointment, please call your pharmacy*   Lab Work: - Your physician recommends that you have lab work today:   BMP/ [place] Entrance at [facility] 1st desk on the right to check in (REGISTRATION)  Lab hours: Monday- Friday (7:30 am- 5:30 pm)   If you have labs (blood work) drawn today and your tests are completely normal, you will receive your results only by: MyChart Message (if you have MyChart) OR A paper copy in the mail If you have any lab test that is abnormal or we need to change your treatment, we will call you to review the results.   Testing/Procedures: - none ordered   Follow-Up: At [facility], you and your health needs are our priority.  As part of our continuing mission to provide you with exceptional heart care, we have created designated Provider Care Teams.  These Care Teams include your primary Cardiologist (physician) and Advanced Practice Providers (APPs -  Physician Assistants and Nurse Practitioners) who all work together to provide you with the care you need, when you need it.  We recommend signing up for the patient portal called "MyChart".  Sign up information is provided on this After Visit Summary.  MyChart is used to connect with patients for Virtual Visits (Telemedicine).  Patients are able to view lab/test results, encounter notes, upcoming appointments, etc.  Non-urgent messages can be sent to your provider as well.   To learn more about what you can do with MyChart, go to [email or web address].    Your next appointment:   4 week(s)  The format for your next appointment:   In Person  Provider:   You may see Ida Rogue, MD or one of the following Advanced Practice Providers on your designated Care Team:     Gerrie Nordmann, NP    Other Instructions  An application for a Disability Parking Placard has been signed by your provider and given to you today  Important Information About Sugar

## 2022-01-25 NOTE — Telephone Encounter (Signed)
-----   Message from Gerrie Nordmann, NP sent at 01/25/2022  1:34 PM EST ----- Start taking oral potassium 40 meq daily times three days then 20 meq daily. Repeat BMP in 1 week.  If he is unable to take the potassium orally he needs to present to the emergency department for infusions.

## 2022-01-25 NOTE — Telephone Encounter (Signed)
Spoke with patient and reviewed results and recommendations. He verbalized understanding stating that he has a lot of those pills on hand so he does not need prescription at this time. He verbalized understanding of all instructions with no further questions at this time.

## 2022-01-25 NOTE — Progress Notes (Signed)
Start taking oral potassium 40 meq daily times three days then 20 meq daily. Repeat BMP in 1 week.  If he is unable to take the potassium orally he needs to present to the emergency department for infusions.

## 2022-01-25 NOTE — Telephone Encounter (Signed)
No answer/Voicemail box is full.  

## 2022-02-21 NOTE — Progress Notes (Unsigned)
Cardiology Office Note  Date:  02/22/2022   ID:  Miguel Fletcher, DOB 1956-02-06, MRN [id number]  PCP:  [facility]   Chief Complaint  Patient presents with   Follow-up    HPI:  [name] is a 66 y.o. male with medical history significant for  CAD status post CABG in August 2022,  tobacco abuse, 1-2 ppd hyperlipidemia,  hypertension,  Medication noncompliance/psychosocial contributing Who presents for f/u of her coronary artery disease CABG, HTN  LOV with myself September 2023 On that visit was only taking carvedilol, had run out of his medications  Recently in the hospital October 2023 for chest pain nausea vomiting It was noted he had not taken his medications for 1 to 2 weeks Potassium 2.4 Echocardiogram: LVEF 60 to 65%, normal function, no RWMA, severe LVH, grade 1 DD, RV systolic function low normal.  Stress test performed, low risk study with no significant ischemia Was discharged on amlodipine 10, carvedilol 3.125 twice daily Lasix 20 daily hydralazine 50 3 times daily isosorbide 60 lisinopril 40 potassium 20 twice daily for 5 days (then what?)  Seen in the ER febrile 23rd 2023, had not been on his medications for at least 2 weeks at that time presented with lightheadedness  Not taking Hydralazine (insomnia, "felt like he was in outer space") and lisinopril (GI upset) Gets his medication to scar clinic  Reports having tenderness to his flank on the right and back, unclear etiology, sore to touch Denies recent shingles that he is aware of  No EKG performed today, 1 done recently in the ER  Other past medical history reviewed Seen in the emergency room [facility] October 23, 2021 hypertensive emergency "BP on arrival 240s/110s, pt asymptomatic and reported his BP normally runs that high. Rx'd lisinopril 20 mg daily, hydrochlorothiazide 25 mg daily, though pt ran out of BP meds last week, says he doesn't take meds regularly. Trop bump to 128, repeat  126."  In ED received: Hydral 10 mg IV, labetalol 10 mg IV + 10 mg PO, lisinopril 10 mg PO.  inability to afford med refills At discharge from the emergency room, hydrochlorothiazide and lasix held 2/2 hypokalemia, but continue additional home BP meds (amlodipine, coreg, lisinopril, imdur). Case manager provided pt with forms for [facility] and PAP prior to discharge.  Underwent bypass surgery Left main and 3-vessel coronary artery disease. Coronary artery bypass grafting x5 Left internal mammary artery to LAD, Left radial artery to ramus intermedius, Saphenous vein graft to first diagonal, Sequential saphenous vein  graft to posterior descending and posterolateral Open left radial harvest.  In recovery required thoracentesis x2 Admitted to the hospital for shortness of breath November 12, 2020  moderate left-sided pleural effusion and symptoms consistent with acute decompensated heart failure with preserved ejection fraction.   S/p thora w 800cc blood tinged fluid.   Was prescribed lasix '20mg'$  daily on DC.    Has been relatively sedentary since surgery Significant problems with left forearm pain at site of surgical radial artery donor location.  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  PSH:    Past Surgical History:  Procedure Laterality Date   CORONARY ARTERY BYPASS GRAFT N/A 10/28/2020   Procedure: CORONARY ARTERY BYPASS GRAFTING (CABG), ON PUMP, TIMES FIVE, USING LEFT INTERNAL MAMMARY ARTERY, LEFT RADIAL ARTERY AND ENDOSCOPICALLY HARVESTED RIGHT GREATER SAPHENOUS VEIN;  Surgeon: Melrose Nakayama, MD;  Location: [place];  Service: Open Heart Surgery;  Laterality: N/A;   ENDOVEIN HARVEST OF GREATER SAPHENOUS VEIN Right 10/28/2020   Procedure: ENDOVEIN HARVEST OF RIGHT GREATER SAPHENOUS VEIN;  Surgeon: Melrose Nakayama, MD;  Location: [place];  Service: Open Heart Surgery;  Laterality: Right;   INTRAVASCULAR ULTRASOUND/IVUS N/A 10/06/2020   Procedure: Intravascular Ultrasound/IVUS;  Surgeon: Sherren Mocha, MD;  Location: [place] CV LAB;  Service: Cardiovascular;  Laterality: N/A;   IR THORACENTESIS ASP PLEURAL SPACE W/IMG GUIDE  11/12/2020   LEFT HEART CATH AND CORONARY ANGIOGRAPHY N/A 10/06/2020   Procedure: LEFT HEART CATH AND CORONARY ANGIOGRAPHY;  Surgeon: Sherren Mocha, MD;  Location: [place] CV LAB;  Service: Cardiovascular;  Laterality: N/A;   RADIAL ARTERY HARVEST Left 10/28/2020   Procedure: LEFT RADIAL ARTERY HARVEST;  Surgeon: Melrose Nakayama, MD;  Location: [place];  Service: Open Heart Surgery;  Laterality: Left;   TEE WITHOUT CARDIOVERSION N/A 10/28/2020   Procedure: TRANSESOPHAGEAL ECHOCARDIOGRAM (TEE);  Surgeon: Melrose Nakayama, MD;  Location: [place];  Service: Open Heart Surgery;  Laterality: N/A;   VARICOSE VEIN SURGERY Left     Current Outpatient Medications  Medication Sig Dispense Refill   amLODipine (NORVASC) 10 MG tablet Take 1 tablet (10 mg total) by mouth daily. 90 tablet 3   aspirin EC 81 MG tablet Take 1 tablet (81 mg total) by mouth daily. Swallow whole. 90 tablet 3   atorvastatin (LIPITOR) 80 MG tablet Take 1 tablet (80 mg total) by mouth daily at 8 pm. 90 tablet 3   doxazosin  (CARDURA) 1 MG tablet Take 1 tablet (1 mg total) by mouth 2 (two) times daily. 180 tablet 3   lisinopril (ZESTRIL) 40 MG tablet Take 1 tablet (40 mg total) by mouth daily. 90 tablet 3   potassium chloride SA (KLOR-CON M) 20 MEQ tablet Take 1 tablet (20 mEq total) by mouth 2 (two) times daily. 180 tablet 3   No current facility-administered medications for this visit.    Allergies:   Patient has no known allergies.   Social History:  The patient  reports that he has been smoking cigarettes. He has a 50.00 pack-year smoking history. He has never used smokeless tobacco. He reports that he does not drink alcohol and does not use drugs.   Family History:   family history includes Alzheimer's disease in his father; Heart disease in his father.    Review of Systems: Review of Systems  Constitutional: Negative.   HENT: Negative.    Respiratory: Negative.    Cardiovascular: Negative.   Gastrointestinal: Negative.   Musculoskeletal: Negative.   Neurological: Negative.   Psychiatric/Behavioral: Negative.    All other systems reviewed and are negative.    PHYSICAL EXAM: VS:  BP (!) 170/90 (BP Location: Left Arm, Patient Position: Sitting, Cuff Size: Large)   Pulse 67  Ht '5\' 8"'$  (1.727 m)   Wt 208 lb (94.3 kg)   SpO2 99%   BMI 31.63 kg/m  , BMI Body mass index is 31.63 kg/m. Constitutional:  oriented to person, place, and time. No distress.  HENT:  Head: Grossly normal Eyes:  no discharge. No scleral icterus.  Neck: No JVD, no carotid bruits  Cardiovascular: Regular rate and rhythm, no murmurs appreciated Pulmonary/Chest: Clear to auscultation bilaterally, no wheezes or rails Abdominal: Soft.  no distension.  no tenderness.  Musculoskeletal: Normal range of motion Neurological:  normal muscle tone. Coordination normal. No atrophy Skin: Skin warm and dry, right flank, right thoracic back skin tenderness Psychiatric: normal affect, pleasant   Recent Labs: 12/21/2021: ALT 12;  Magnesium 1.9; Magnesium 2.0 01/25/2022: BUN 15; Creatinine, Ser 1.17; Hemoglobin 14.1; Platelets 196; Potassium 2.9; Sodium 138    Lipid Panel Lab Results  Component Value Date   CHOL 72 (L) 11/09/2020   HDL 21 (L) 11/09/2020   LDLCALC 33 11/09/2020   TRIG 87 11/09/2020      Wt Readings from Last 3 Encounters:  02/22/22 208 lb (94.3 kg)  01/25/22 202 lb 6 oz (91.8 kg)  01/13/22 200 lb 12.8 oz (91.1 kg)     ASSESSMENT AND PLAN:  Problem List Items Addressed This Visit       Cardiology Problems   Essential hypertension (Chronic)   Relevant Medications   lisinopril (ZESTRIL) 40 MG tablet   doxazosin (CARDURA) 1 MG tablet   Chronic diastolic CHF (congestive heart failure) (HCC) - Primary   Relevant Medications   lisinopril (ZESTRIL) 40 MG tablet   doxazosin (CARDURA) 1 MG tablet   CAD (coronary artery disease)   Relevant Medications   lisinopril (ZESTRIL) 40 MG tablet   doxazosin (CARDURA) 1 MG tablet     Other   S/P CABG x 5   Other Visit Diagnoses     Hyperlipidemia LDL goal <70       Relevant Medications   lisinopril (ZESTRIL) 40 MG tablet   doxazosin (CARDURA) 1 MG tablet   Tobacco abuse         Bell's palsy Has made good recovery  Coronary artery disease with stable angina Smoking cessation recommended Currently with no symptoms of angina. No further workup at this time. Continue current medication regimen. Recent stress test with no ischemia  Smoker Smoking cessation recommended, still smoking 1 to 2 packs/day  Hyperlipidemia Recommend he stay on Lipitor 80 daily  Essential hypertension Long history of medication noncompliance secondary to financial issues Reports he receives his medications through the scar clinic Not taking hydralazine or lisinopril on a consistent manner He is willing to retry lisinopril, previously gave him GI upset He does not want to take hydralazine reports it causes out of body experience Reports some dysuria, we will  start Cardura 1 mg twice daily with amlodipine 10 continue lisinopril 40 Discussed with him that blood pressure is markedly elevated today 169 systolic, recheck  Forearm pain, numbness Consistent with nerve damage after radial artery was removed, likely with some scar tissue, permanent nerve injury Continues to work in metal fabrication  GERD Recommend over-the-counter omeprazole 20 daily   Total encounter time more than 30 minutes  Greater than 50% was spent in counseling and coordination of care with the patient    Signed, Esmond Plants, M.D., Ph.D. [place], [place]

## 2022-02-22 ENCOUNTER — Ambulatory Visit: Payer: Medicare Other | Attending: Cardiovascular Disease | Admitting: Cardiovascular Disease

## 2022-02-22 ENCOUNTER — Encounter: Payer: Self-pay | Admitting: Cardiovascular Disease

## 2022-02-22 VITALS — BP 180/90 | HR 67 | Ht 68.0 in | Wt 208.0 lb

## 2022-02-22 DIAGNOSIS — I5032 Chronic diastolic (congestive) heart failure: Secondary | ICD-10-CM | POA: Diagnosis present

## 2022-02-22 DIAGNOSIS — Z951 Presence of aortocoronary bypass graft: Secondary | ICD-10-CM | POA: Insufficient documentation

## 2022-02-22 DIAGNOSIS — Z720 Tobacco use: Secondary | ICD-10-CM | POA: Diagnosis present

## 2022-02-22 DIAGNOSIS — I10 Essential (primary) hypertension: Secondary | ICD-10-CM | POA: Diagnosis present

## 2022-02-22 DIAGNOSIS — I2510 Atherosclerotic heart disease of native coronary artery without angina pectoris: Secondary | ICD-10-CM | POA: Insufficient documentation

## 2022-02-22 DIAGNOSIS — E785 Hyperlipidemia, unspecified: Secondary | ICD-10-CM | POA: Insufficient documentation

## 2022-02-22 MED ORDER — POTASSIUM CHLORIDE CRYS ER 20 MEQ PO TBCR: 20.0000 meq | EXTENDED_RELEASE_TABLET | Freq: Two times a day (BID) | ORAL | 3 refills | Status: DC

## 2022-02-22 MED ORDER — LISINOPRIL 40 MG PO TABS: 40.0000 mg | ORAL_TABLET | Freq: Every day | ORAL | 3 refills | Status: DC

## 2022-02-22 MED ORDER — DOXAZOSIN MESYLATE 1 MG PO TABS: 1.0000 mg | ORAL_TABLET | Freq: Two times a day (BID) | ORAL | 3 refills | Status: DC

## 2022-02-22 NOTE — Patient Instructions (Addendum)
Medication Instructions:  STOP hydralazine Please increase potassium up to 20 meq two a day Please start doxazosin 1 mg twice a day for blood pressure Stay on amlodipine Retry lisinopril  If you need a refill on your cardiac medications before your next appointment, please call your pharmacy.   Lab work: No new labs needed  Testing/Procedures: No new testing needed  Follow-Up: At [facility], you and your health needs are our priority.  As part of our continuing mission to provide you with exceptional heart care, we have created designated Provider Care Teams.  These Care Teams include your primary Cardiologist (physician) and Advanced Practice Providers (APPs -  Physician Assistants and Nurse Practitioners) who all work together to provide you with the care you need, when you need it.  You will need a follow up appointment in 6 months  Providers on your designated Care Team:   Murray Hodgkins, NP Christell Faith, PA-C Cadence Kathlen Mody, [place]  COVID-19 Vaccine Information can be found at: [email or web address] For questions related to vaccine distribution or appointments, please email [email or web address] or call [phone].

## 2022-05-31 ENCOUNTER — Ambulatory Visit (INDEPENDENT_AMBULATORY_CARE_PROVIDER_SITE_OTHER): Payer: Medicare PPO | Admitting: Podiatry

## 2022-05-31 ENCOUNTER — Encounter: Payer: Self-pay | Admitting: Podiatry

## 2022-05-31 VITALS — BP 242/105 | HR 68

## 2022-05-31 DIAGNOSIS — I70223 Atherosclerosis of native arteries of extremities with rest pain, bilateral legs: Secondary | ICD-10-CM

## 2022-05-31 NOTE — Progress Notes (Signed)
Chief Complaint  Patient presents with   Foot Pain    "My feet hurt.  They burn and itch all the time." N - feet hurt L - dorsal bilateral D - about 1 year O - suddenly C - they were raw from me scratching, burn, itch, sharp pain, skin was peeling off A - when try to bend the big toes upward T - lotion and neosporin, Tylenol, Ibuprofen, epsom salt    HPI: 67 y.o. male PMHx HTN, CAD, 25-pack-year smoker presenting today as a new patient for evaluation of bilateral foot pain that is been ongoing for about 1 year.  Gradual onset.  Denies a history of injury.  Patient is a Building control surveyor and has pain throughout the day especially squatting position.  Patient states that the majority the pain is when he elevates his feet at night when he goes to sleep.  He has been applying lotion to his feet and taking Tylenol and ibuprofen with Epsom salts soaks.   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 08/2020 MRI [facility]): Well-circumscribed T1 hypointense, T2 hyperintense enhancing mass within the right axilla measuring 3.1 x 2.6 x 3.0 cm.  Mass extends along the course of the axillary neurovascular structures.  No evidence for invasion into the adjacent musculature.  No enlarged axillary lymph nodes.   Bell's palsy    Biliary colic    CAD (coronary artery disease)    a. 09/2020 NSTEMI/Cath: LM 60-70d, LAD 70p, D2 60, RI 70p, LCX ild diff dzs, RCA 40-36m RPDA 50, RPAV 50; b. 10/2020 CABG x 5 (LIMA->LAD, L radial->RI, VG->D1, VG->RPDA->RPL.   Diastolic dysfunction    a. 09/2020 Echo: EF 60-65%, no rwma, sev LVH. Gr1 DD. Nl RV size/fxn. Mod dil LA.   Hemorrhoids    Hypercholesterolemia    Hypertension    Stroke (cerebrum) ([place]    Tobacco use     Past Surgical History:  Procedure Laterality Date   CORONARY ARTERY BYPASS GRAFT N/A 10/28/2020   Procedure: CORONARY ARTERY BYPASS GRAFTING (CABG), ON PUMP, TIMES FIVE, USING LEFT INTERNAL MAMMARY ARTERY, LEFT RADIAL  ARTERY AND ENDOSCOPICALLY HARVESTED RIGHT GREATER SAPHENOUS VEIN;  Surgeon: HMelrose Nakayama MD;  [place]  Service: Open Heart Surgery;  Laterality: N/A;   ENDOVEIN HARVEST OF GREATER SAPHENOUS VEIN Right 10/28/2020   Procedure: ENDOVEIN HARVEST OF RIGHT GREATER SAPHENOUS VEIN;  Surgeon: HMelrose Nakayama MD;  [place]  Service: Open Heart Surgery;  Laterality: Right;   INTRAVASCULAR ULTRASOUND/IVUS N/A 10/06/2020   Procedure: Intravascular Ultrasound/IVUS;  Surgeon: [name] MD;  [place];  Service: Cardiovascular;  Laterality: N/A;   IR THORACENTESIS ASP PLEURAL SPACE W/IMG GUIDE  11/12/2020   LEFT HEART CATH AND CORONARY ANGIOGRAPHY N/A 10/06/2020   Procedure: LEFT HEART CATH AND CORONARY ANGIOGRAPHY;  Surgeon: [name] MD;  [place] [place] [facility];  Service: Cardiovascular;  Laterality: N/A;   RADIAL ARTERY HARVEST Left 10/28/2020   Procedure: LEFT RADIAL ARTERY HARVEST;  Surgeon: HMelrose Nakayama MD;  [place]  Service: Open Heart Surgery;  Laterality: Left;   TEE WITHOUT CARDIOVERSION N/A 10/28/2020   Procedure: TRANSESOPHAGEAL ECHOCARDIOGRAM (TEE);  Surgeon: HMelrose Nakayama MD;  [place]  Service: Open Heart Surgery;  Laterality: N/A;   VARICOSE VEIN SURGERY Left     No Known Allergies   Physical Exam: General: The patient is alert and oriented x3 in no  acute distress.  Dermatology: Skin is warm, dry and supple bilateral lower extremities. Negative for open lesions or macerations.  Vascular: Nonpalpable pedal pulses bilateral.  Capillary refill delayed.  Delayed capillary refill also to the dorsum of the feet.  The toes are cool to touch  Neurological: Grossly intact via light touch  Musculoskeletal Exam: No pedal deformities noted   Assessment: 1.  Critical limb ischemia bilateral lower extremities  -Patient evaluated -Order placed for ABIs bilateral -Explained to the patient I do  believe this is a circulation issue.  If ABIs are abnormal refer for vascular consult -Return to clinic as needed      Edrick Kins, DPM [facility]  Dr. Edrick Kins, DPM    [address], [place] [address]                Office [phone]  Fax [phone]

## 2022-06-06 ENCOUNTER — Other Ambulatory Visit: Payer: Self-pay | Admitting: Podiatry

## 2022-06-06 DIAGNOSIS — I70223 Atherosclerosis of native arteries of extremities with rest pain, bilateral legs: Secondary | ICD-10-CM

## 2022-06-14 ENCOUNTER — Ambulatory Visit: Payer: Medicare Other | Admitting: Cardiovascular Disease

## 2022-07-12 ENCOUNTER — Telehealth: Payer: Self-pay | Admitting: *Deleted

## 2022-07-12 NOTE — Telephone Encounter (Signed)
No prior authorization is required for [place] study, ZOX:[id number]: Amy V.

## 2022-07-12 NOTE — Telephone Encounter (Signed)
Miguel Fletcher is calling for a prior authorization for the vascular US scheduled tomorrow :30, will be working on this done.

## 2022-07-13 ENCOUNTER — Ambulatory Visit: Payer: Medicare PPO | Attending: Podiatry

## 2022-07-13 ENCOUNTER — Other Ambulatory Visit: Payer: Self-pay | Admitting: Podiatry

## 2022-07-13 DIAGNOSIS — I70223 Atherosclerosis of native arteries of extremities with rest pain, bilateral legs: Secondary | ICD-10-CM

## 2022-07-13 LAB — VAS US ABI WITH/WO TBI
Left ABI: 1.06
Right ABI: 0.84

## 2022-07-29 ENCOUNTER — Encounter: Payer: Medicare PPO | Admitting: Vascular Surgery

## 2022-08-01 ENCOUNTER — Encounter: Payer: Medicare PPO | Admitting: Surgery

## 2022-08-05 ENCOUNTER — Ambulatory Visit (INDEPENDENT_AMBULATORY_CARE_PROVIDER_SITE_OTHER): Payer: Medicare PPO | Admitting: Podiatry

## 2022-08-05 DIAGNOSIS — B353 Tinea pedis: Secondary | ICD-10-CM | POA: Diagnosis not present

## 2022-08-05 DIAGNOSIS — I70223 Atherosclerosis of native arteries of extremities with rest pain, bilateral legs: Secondary | ICD-10-CM

## 2022-08-05 MED ORDER — CLOTRIMAZOLE-BETAMETHASONE 1-0.05 % EX CREA: 1.0000 | TOPICAL_CREAM | Freq: Every day | CUTANEOUS | 2 refills | Status: DC

## 2022-08-05 MED ORDER — TERBINAFINE HCL 250 MG PO TABS: 250.0000 mg | ORAL_TABLET | Freq: Every day | ORAL | 0 refills | Status: DC

## 2022-08-05 NOTE — Progress Notes (Signed)
No chief complaint on file.   HPI: 67 y.o. male PMHx HTN, CAD, 25-pack-year smoker presenting today for follow-up evaluation of bilateral lower extremity pain.  Patient was seen as a new patient in the office on 05/31/2022.  At that time there was concern for vascular compromise and critical limb ischemia.  Order placed for noninvasive vascular studies.  Patient had those completed and has a follow-up appointment with vascular pending.  Patient states that most recently he has developed pain and tenderness due to a skin rash to the dorsum of the bilateral feet.  He says it itches and burns on a constant basis.  This is in addition to the chronic pain that he has been experiencing over the past year.  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 08/2020 MRI [facility]): Well-circumscribed T1 hypointense, T2 hyperintense enhancing mass within the right axilla measuring 3.1 x 2.6 x 3.0 cm.  Mass extends along the course of the axillary neurovascular structures.  No evidence for invasion into the adjacent musculature.  No enlarged axillary lymph nodes.   Bell's palsy    Biliary colic    CAD (coronary artery disease)    a. 09/2020 NSTEMI/Cath: LM 60-70d, LAD 70p, D2 60, RI 70p, LCX ild diff dzs, RCA 40-83m, RPDA 50, RPAV 50; b. 10/2020 CABG x 5 (LIMA->LAD, L radial->RI, VG->D1, VG->RPDA->RPL.   Diastolic dysfunction    a. 09/2020 Echo: EF 60-65%, no rwma, sev LVH. Gr1 DD. Nl RV size/fxn. Mod dil LA.   Hemorrhoids    Hypercholesterolemia    Hypertension    Stroke (cerebrum) (HCC)    Tobacco use     Past Surgical History:  Procedure Laterality Date   CORONARY ARTERY BYPASS GRAFT N/A 10/28/2020   Procedure: CORONARY ARTERY BYPASS GRAFTING (CABG), ON PUMP, TIMES FIVE, USING LEFT INTERNAL MAMMARY ARTERY, LEFT RADIAL ARTERY AND ENDOSCOPICALLY HARVESTED RIGHT GREATER SAPHENOUS VEIN;  Surgeon: Loreli Slot, MD;  [facility];  Service: Open Heart Surgery;   Laterality: N/A;   CORONARY ULTRASOUND/IVUS N/A 10/06/2020   Procedure: Intravascular Ultrasound/IVUS;  Surgeon: Tonny Bollman, MD;  [facility] INVASIVE CV LAB;  Service: Cardiovascular;  Laterality: N/A;   ENDOVEIN HARVEST OF GREATER SAPHENOUS VEIN Right 10/28/2020   Procedure: ENDOVEIN HARVEST OF RIGHT GREATER SAPHENOUS VEIN;  Surgeon: Loreli Slot, MD;  Location: [facility] OR;  Service: Open Heart Surgery;  Laterality: Right;   IR THORACENTESIS ASP PLEURAL SPACE W/IMG GUIDE  11/12/2020   LEFT HEART CATH AND CORONARY ANGIOGRAPHY N/A 10/06/2020   Procedure: LEFT HEART CATH AND CORONARY ANGIOGRAPHY;  Surgeon: Tonny Bollman, MD;  [facility]VAS[facility] CV LAB;  Service: Cardiovascular;  Laterality: N/A;   RADIAL ARTERY HARVEST Left 10/28/2020   Procedure: LEFT RADIAL ARTERY HARVEST;  Surgeon: Loreli Slot, MD;  Location: [facility];  Service: Open Heart Surgery;  Laterality: Left;   TEE WITHOUT CARDIOVERSION N/A 10/28/2020   Procedure: TRANSESOPHAGEAL ECHOCARDIOGRAM (TEE);  Surgeon: Loreli Slot, MD;  [facility];  Service: Open Heart Surgery;  Laterality: N/A;   VARICOSE VEIN SURGERY Left     No Known Allergies    B/L feet 08/05/2022  Physical Exam: General: The patient is alert and oriented x3 in no acute distress.  Dermatology: Inflammatory dermatitis with superficial skin breakdown and fissuring noted to the digits and dorsum of the feet bilateral.  No drainage.  There is some slight maceration in between the toes however the patient states  that his feet get wet on a daily basis while working.  Vascular: VAS [place] ABI W/WO TBI 07/13/2022 ABI Findings:  +---------+------------------+-----+----------+--------+  Right   Rt Pressure (mmHg)IndexWaveform  Comment   +---------+------------------+-----+----------+--------+  Brachial 204                                        +---------+------------------+-----+----------+--------+  PTA     173                0.84 monophasic          +---------+------------------+-----+----------+--------+  DP      169               0.82 monophasic          +---------+------------------+-----+----------+--------+  Great Toe114               0.55 Abnormal            +---------+------------------+-----+----------+--------+   +---------+------------------+-----+----------+-------+  Left    Lt Pressure (mmHg)IndexWaveform  Comment  +---------+------------------+-----+----------+-------+  Brachial 207                                       +---------+------------------+-----+----------+-------+  PTA     180               0.87 monophasic         +---------+------------------+-----+----------+-------+  DP      219               1.06 monophasic         +---------+------------------+-----+----------+-------+  Great Toe128               0.62 Abnormal           +---------+------------------+-----+----------+-------+   +-------+-----------+-----------+------------+------------+  ABI/TBIToday's ABIToday's TBIPrevious ABIPrevious TBI  +-------+-----------+-----------+------------+------------+  Right 0.84       0.55                                 +-------+-----------+-----------+------------+------------+  Left  1.06       0.62                                 +-------+-----------+-----------+------------+------------+  PPG tracings display appropriate pulsatility.    Summary:  Right: Resting right ankle-brachial index indicates mild right lower  extremity arterial disease. The right toe-brachial index is abnormal.   Left: Resting left ankle-brachial index is within normal range. The left  toe-brachial index is abnormal.   Neurological: Grossly intact via light touch  Musculoskeletal Exam: No pedal deformity.  No prior amputation  Assessment/Plan of Care: 1.  Inflammatory dermatitis vs. severe tinea pedis bilateral feet 2.  Critical limb ischemia  bilateral lower extremities  -Patient evaluated.  I do believe the patient may be suffering from tinea pedis of the bilateral feet complicated by critical limb ischemia. -Prescription for Lotrisone cream apply 2 times daily -Prescription for Lamisil 250MG  #30 -Patient states that he has a follow-up appointment with vascular over the next few weeks. -Return to clinic 4 weeks for follow-up       Felecia Shelling, DPM [facility]  Dr. Felecia Shelling, DPM    [address], [place] [address]                Office [phone]  Fax [phone]

## 2022-08-24 ENCOUNTER — Ambulatory Visit: Payer: Medicare Other | Admitting: Cardiovascular Disease

## 2022-08-26 ENCOUNTER — Ambulatory Visit (INDEPENDENT_AMBULATORY_CARE_PROVIDER_SITE_OTHER): Payer: Medicare PPO | Admitting: Podiatry

## 2022-08-26 DIAGNOSIS — I70223 Atherosclerosis of native arteries of extremities with rest pain, bilateral legs: Secondary | ICD-10-CM | POA: Diagnosis not present

## 2022-08-26 NOTE — Progress Notes (Signed)
Chief Complaint  Patient presents with   Tinea Pedis    HPI: 67 y.o. male PMHx HTN, CAD, 25-pack-year smoker presenting today for follow-up evaluation of bilateral lower extremity pain.  Patient was seen as a new patient in the office on 05/31/2022.  At that time there was concern for vascular compromise and critical limb ischemia.  Order placed for noninvasive vascular studies.  Patient had those completed and has a follow-up appointment with vascular pending.  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 08/2020 MRI [facility]): Well-circumscribed T1 hypointense, T2 hyperintense enhancing mass within the right axilla measuring 3.1 x 2.6 x 3.0 cm.  Mass extends along the course of the axillary neurovascular structures.  No evidence for invasion into the adjacent musculature.  No enlarged axillary lymph nodes.   Bell's palsy    Biliary colic    CAD (coronary artery disease)    a. 09/2020 NSTEMI/Cath: LM 60-70d, LAD 70p, D2 60, RI 70p, LCX ild diff dzs, RCA 40-55m, RPDA 50, RPAV 50; b. 10/2020 CABG x 5 (LIMA->LAD, L radial->RI, VG->D1, VG->RPDA->RPL.   Diastolic dysfunction    a. 09/2020 Echo: EF 60-65%, no rwma, sev LVH. Gr1 DD. Nl RV size/fxn. Mod dil LA.   Hemorrhoids    Hypercholesterolemia    Hypertension    Stroke (cerebrum) (HCC)    Tobacco use     Past Surgical History:  Procedure Laterality Date   CORONARY ARTERY BYPASS GRAFT N/A 10/28/2020   Procedure: CORONARY ARTERY BYPASS GRAFTING (CABG), ON PUMP, TIMES FIVE, USING LEFT INTERNAL MAMMARY ARTERY, LEFT RADIAL ARTERY AND ENDOSCOPICALLY HARVESTED RIGHT GREATER SAPHENOUS VEIN;  Surgeon: Loreli Slot, MD;  Location: [facility] OR;  Service: Open Heart Surgery;  Laterality: N/A;   CORONARY ULTRASOUND/IVUS N/A 10/06/2020   Procedure: Intravascular Ultrasound/IVUS;  Surgeon: Tonny Bollman, MD;  Location: [facility] INVASIVE CV LAB;  Service: Cardiovascular;  Laterality: N/A;   ENDOVEIN HARVEST OF GREATER  SAPHENOUS VEIN Right 10/28/2020   Procedure: ENDOVEIN HARVEST OF RIGHT GREATER SAPHENOUS VEIN;  Surgeon: Loreli Slot, MD;  Location: [facility] OR;  Service: Open Heart Surgery;  Laterality: Right;   IR THORACENTESIS ASP PLEURAL SPACE W/IMG GUIDE  11/12/2020   LEFT HEART CATH AND CORONARY ANGIOGRAPHY N/A 10/06/2020   Procedure: LEFT HEART CATH AND CORONARY ANGIOGRAPHY;  Surgeon: Tonny Bollman, MD;  Location: [facility] INVASIVE CV LAB;  Service: Cardiovascular;  Laterality: N/A;   RADIAL ARTERY HARVEST Left 10/28/2020   Procedure: LEFT RADIAL ARTERY HARVEST;  Surgeon: Loreli Slot, MD;  Location: [facility] OR;  Service: Open Heart Surgery;  Laterality: Left;   TEE WITHOUT CARDIOVERSION N/A 10/28/2020   Procedure: TRANSESOPHAGEAL ECHOCARDIOGRAM (TEE);  Surgeon: Loreli Slot, MD;  Location: [facility] OR;  Service: Open Heart Surgery;  Laterality: N/A;   VARICOSE VEIN SURGERY Left     No Known Allergies    B/L feet 08/05/2022  Physical Exam: General: The patient is alert and oriented x3 in no acute distress.  Dermatology: Inflammatory dermatitis with superficial skin breakdown and fissuring noted to the digits and dorsum of the feet bilateral.  No drainage.  There is some slight maceration in between the toes however the patient states that his feet get wet on a daily basis while working.  Vascular: VAS [place] ABI W/WO TBI 07/13/2022 ABI Findings:  +---------+------------------+-----+----------+--------+  Right   Rt Pressure (mmHg)IndexWaveform  Comment   +---------+------------------+-----+----------+--------+  Brachial 204                                        +---------+------------------+-----+----------+--------+  PTA     173               0.84 monophasic          +---------+------------------+-----+----------+--------+  DP      169               0.82 monophasic          +---------+------------------+-----+----------+--------+  Great Toe114               0.55  Abnormal            +---------+------------------+-----+----------+--------+   +---------+------------------+-----+----------+-------+  Left    Lt Pressure (mmHg)IndexWaveform  Comment  +---------+------------------+-----+----------+-------+  Brachial 207                                       +---------+------------------+-----+----------+-------+  PTA     180               0.87 monophasic         +---------+------------------+-----+----------+-------+  DP      219               1.06 monophasic         +---------+------------------+-----+----------+-------+  Great Toe128               0.62 Abnormal           +---------+------------------+-----+----------+-------+   +-------+-----------+-----------+------------+------------+  ABI/TBIToday's ABIToday's TBIPrevious ABIPrevious TBI  +-------+-----------+-----------+------------+------------+  Right 0.84       0.55                                 +-------+-----------+-----------+------------+------------+  Left  1.06       0.62                                 +-------+-----------+-----------+------------+------------+  PPG tracings display appropriate pulsatility.    Summary:  Right: Resting right ankle-brachial index indicates mild right lower  extremity arterial disease. The right toe-brachial index is abnormal.   Left: Resting left ankle-brachial index is within normal range. The left  toe-brachial index is abnormal.   Neurological: Grossly intact via light touch  Musculoskeletal Exam: No pedal deformity.  No prior amputation  Assessment/Plan of Care: 1.  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  Critical limb ischemia bilateral lower extremities  -Patient evaluated.  -I do not see a referral or pending appointment with vascular.  New vascular referral placed in the patient's chart to [facility] specialist. -I believe the majority the  patient's symptoms and pathology are coming from the poor circulation in the lower extremities. -Return to clinic as needed       Felecia Shelling, DPM [facility]  Dr. Felecia Shelling, DPM    [address] [place], [place] [address]                Office [phone]  Fax [phone]

## 2022-08-31 ENCOUNTER — Ambulatory Visit: Payer: Self-pay | Admitting: Cardiology

## 2022-09-23 ENCOUNTER — Ambulatory Visit: Payer: Medicare PPO | Attending: Cardiovascular Disease | Admitting: Cardiology

## 2022-09-23 ENCOUNTER — Encounter: Payer: Self-pay | Admitting: Cardiology

## 2022-09-23 VITALS — BP 194/100 | HR 47 | Ht 68.0 in | Wt 200.4 lb

## 2022-09-23 DIAGNOSIS — I739 Peripheral vascular disease, unspecified: Secondary | ICD-10-CM

## 2022-09-23 DIAGNOSIS — I10 Essential (primary) hypertension: Secondary | ICD-10-CM

## 2022-09-23 DIAGNOSIS — I2510 Atherosclerotic heart disease of native coronary artery without angina pectoris: Secondary | ICD-10-CM

## 2022-09-23 DIAGNOSIS — Z951 Presence of aortocoronary bypass graft: Secondary | ICD-10-CM

## 2022-09-23 DIAGNOSIS — I5032 Chronic diastolic (congestive) heart failure: Secondary | ICD-10-CM

## 2022-09-23 DIAGNOSIS — E785 Hyperlipidemia, unspecified: Secondary | ICD-10-CM | POA: Diagnosis not present

## 2022-09-23 DIAGNOSIS — R079 Chest pain, unspecified: Secondary | ICD-10-CM

## 2022-09-23 DIAGNOSIS — Z720 Tobacco use: Secondary | ICD-10-CM

## 2022-09-23 DIAGNOSIS — R2231 Localized swelling, mass and lump, right upper limb: Secondary | ICD-10-CM

## 2022-09-23 MED ORDER — DOXAZOSIN MESYLATE 1 MG PO TABS: 1.0000 mg | ORAL_TABLET | Freq: Every day | ORAL | 3 refills | Status: DC

## 2022-09-23 MED ORDER — ATORVASTATIN CALCIUM 80 MG PO TABS: 80.0000 mg | ORAL_TABLET | Freq: Every day | ORAL | 3 refills | Status: DC

## 2022-09-23 MED ORDER — POTASSIUM CHLORIDE CRYS ER 20 MEQ PO TBCR: 20.0000 meq | EXTENDED_RELEASE_TABLET | Freq: Two times a day (BID) | ORAL | 3 refills | Status: DC

## 2022-09-23 MED ORDER — ASPIRIN 81 MG PO TBEC: 81.0000 mg | DELAYED_RELEASE_TABLET | Freq: Every day | ORAL | 3 refills | Status: AC

## 2022-09-23 MED ORDER — AMLODIPINE BESYLATE 10 MG PO TABS: 10.0000 mg | ORAL_TABLET | Freq: Every day | ORAL | 3 refills | Status: DC

## 2022-09-23 MED ORDER — LISINOPRIL 40 MG PO TABS: 40.0000 mg | ORAL_TABLET | Freq: Every day | ORAL | 3 refills | Status: DC

## 2022-09-23 NOTE — Progress Notes (Signed)
Cardiology Office Note:  .   Date:  09/23/2022  ID:  Miguel Fletcher, DOB 08-May-1955, MRN [id number] PCP: Shane Crutch, PA   HeartCare Providers Cardiologist:  Julien Nordmann, MD    History of Present Illness: .   Miguel Fletcher is a 67 y.o. male with past medical history of coronary artery disease status post CABG in 10/2020, hypertension, hyperlipidemia, Bell's palsy, medication noncompliance presents today for follow-up.  He underwent CABG x 5 in 10/2020.  Postprocedure was complicated by the need for thoracentesis subsequent acute heart failure.  Limited echocardiogram revealed LVEF 55%, severe LVH, severely dilated LA, MR.  He was subsequently started on Lasix 20 mg daily.  He was evaluated in the emergency department at [facility] on 8/23 for hypertensive urgency with blood pressures 200/100's.  Unfortunately he ran out of his medications and was sent home on HCTZ, amlodipine, Coreg, lisinopril, and Imdur.  Lasix was on hold due to hypokalemia.  Echocardiogram done 12/20/2021 with an LVEF of 60 to 65%, severe LVH, G1 DD, mild mitral regurgitation, and aortic dilatation.  Patient 30-minute #stress testing was completed which revealed no ischemia but showed LAD and RCA calcifications.  He was last seen in clinic 06/23/2021 by Dr. Mariah Milling.  And had continued to have issues with medication compliance.  He reported having tenderness to his flank and on his right side with unclear etiology.  He was started on Cardura 1 mg twice daily with amlodipine 10 mg daily and lisinopril 40 mg daily.  No further testing was needed at that time.  He returns to clinic today after being out of his medications for the last three weeks. He has chest discomfort in the center of his chest that started after lifting a heavy beam at work. There was no popping or stabbing pain but he is able to pinpoint the area in his chest that just hurts right on top of a sternal wire. He also has a swollen nodule in his right arm pit  that is painful to mild palpitation. His biggest complaint today is claudication to his bilateral lower extremities with associated rest pain that is a burning type pain. The skin to his bilateral feet has become discolored and constantly itching. He has fatigue and endorses increased sleepiness as of late. He denies any hospitalizations or visits to the emergency department.   ROS: 10 point review of systems has been reviewed and considered negative with exception of what is listed in HPI  Studies Reviewed: Marland Kitchen   EKG Interpretation Date/Time:  Friday September 23 2022 08:24:15 EDT Ventricular Rate:  47 PR Interval:  246 QRS Duration:  150 QT Interval:  516 QTC Calculation: 456 R Axis:   -46  Text Interpretation: Sinus bradycardia with 1st degree A-V block Possible Left atrial enlargement , ST depression and T wave inversion Left bundle branch block Left anterior fasicular block When compared with ECG of 21-Dec-2021 07:54, PREVIOUS ECG IS PRESENT Confirmed by Charlsie Quest ([id number]) on 09/23/2022 2:57:27 PM    Lexiscan Myoview 12/22/21   Findings are consistent with no ischemia. The study is low risk.   LV perfusion is normal.   Left ventricular function is normal visually. calculated EF 45%, correlation with echo advised   LAD and RCA calcifications noted   Study quality degraded by diaphragmatic attenuations Risk Assessment/Calculations:     HYPERTENSION CONTROL Vitals:   09/23/22 0819 09/23/22 0827  BP: (!) 192/100 (!) 194/100    The patient's blood pressure is elevated above  target today.  In order to address the patient's elevated BP: A current anti-hypertensive medication was adjusted today. (restarted medication patietn has been out of them for three weeks)          Physical Exam:   VS:  BP (!) 194/100 (BP Location: Right Arm, Patient Position: Sitting, Cuff Size: Large)   Pulse (!) 47   Ht 5\' 8"  (1.727 m)   Wt 200 lb 6.4 oz (90.9 kg)   SpO2 99%   BMI 30.47 kg/m    Wt Readings  from Last 3 Encounters:  09/23/22 200 lb 6.4 oz (90.9 kg)  02/22/22 208 lb (94.3 kg)  01/25/22 202 lb 6 oz (91.8 kg)    GEN: Well nourished, well developed in no acute distress NECK: No JVD; No carotid bruits CARDIAC: RRR, no murmurs, rubs, gallops RESPIRATORY:  Clear to auscultation without rales, wheezing or rhonchi  ABDOMEN: Soft, non-tender, non-distended EXTREMITIES:  No edema; No deformity, the bilateral lower extremities are patent in color, dorsalis pedis and posterior tibialis pulses had to be dopplered on the right and the left.  Scabbed areas noted to the toes on right and left foot.  ASSESSMENT AND PLAN: .   Coronary artery disease status post CABG times 04/09/2020.  Has concerns today with the chest discomfort after taking a large beam at work.  Previously had undergone a Lexiscan MPI 12/2021 was considered a low risk study.  He also has extremely elevated blood pressure today as he has not had any of his medications for the past 3 weeks.  EKG today reveals sinus bradycardia with rate of rhythm with first-degree AV block, possible left atrial enlargement ST depression T wave inversion, left bundle branch block, left anterior fascicular block, and LVH.  This is similar to previous EKG he had in October 2023.  The chest discomfort that he explains is a pinpoint area in the center of his chest right over one of the sternal wires.  He has been sent for a chest x-ray to ensure stability.  And being restarted on all of his medications.  Aspirin 81 mg daily and atorvastatin 80 mg.  Uncontrolled hypertension with issues for med compliance.  Patient states he has been out of his medications approximately 3 weeks.  BP today was 190/101 190/100.  Continued to educate patient on risk for MI and stroke.  Patient states that he is too mean for this things to happen to him.  Looked up prices for medications of amlodipine, doxazosin, lisinopril (given to patient and a prescription sent into the pharmacy  of his request.  Chronic diastolic congestive heart failure without symptoms of decompensation.  Euvolemic on exam.  Continued on lisinopril and doxazosin.  Unfortunately there is a component of noncompliance with medication.  Weight is down 8 pounds since last visit.  Mixed hyperlipidemia with his last LDL 93.  Will require an updated lipid panel on return as last panel was done 10/24/2021.  Advised that goal is less than 55.  Not interested in adding any other medications at this time due to cost and med compliance.Continued on atorvastatin 80 mg daily.  Peripheral arterial disease with claudication with muscle pain and aches on ambulation and rest pain with numbness and burning. Previously follow up with podiatry who had concerns for ischemic limb and was being referred to [facility]. Patient did not follow up.  Lower arterial duplex study revealed 50-74% stenosis of the right mid SFA, resting right ankle-brachial index felt mild right lower extremity arterial  disease and right toe brachial index being abnormal, resting left ankle-brachial index was within normal range and the left toe brachial index is abnormal.  Referred to Dr Kirke Corin for evaluation.  Tobacco abuse, no desire to quit, total cessation recommended.  Right axillary mass with swelling and pain.  Will determine if anything shows up on chest x-ray.  Patient has been encouraged to follow-up with PCP for concerns as well.       Dispo: Patient return to clinic to see MD/APP in 2 weeks.  Blood pressure after being restarted on medications as well as follow-up chest x-ray  Signed, Emmarae Cowdery, NP

## 2022-09-23 NOTE — Patient Instructions (Addendum)
Medication Instructions:  Start taking Doxazosin (Cardura) 1 mg once daily *If you need a refill on your cardiac medications before your next appointment, please call your pharmacy*   Lab Work: none If you have labs (blood work) drawn today and your tests are completely normal, you will receive your results only by: MyChart Message (if you have MyChart) OR A paper copy in the mail If you have any lab test that is abnormal or we need to change your treatment, we will call you to review the results.   Testing/Procedures: Your provider has ordered a chest X-Ray for you. You can have this done at the [facility] medical mall. You do not need an appointment. Please go to the entrance of the Medical Mall and check in at the front desk.     Follow-Up: At [facility], you and your health needs are our priority.  As part of our continuing mission to provide you with exceptional heart care, we have created designated Provider Care Teams.  These Care Teams include your primary Cardiologist (physician) and Advanced Practice Providers (APPs -  Physician Assistants and Nurse Practitioners) who all work together to provide you with the care you need, when you need it.  We recommend signing up for the patient portal called "MyChart".  Sign up information is provided on this After Visit Summary.  MyChart is used to connect with patients for Virtual Visits (Telemedicine).  Patients are able to view lab/test results, encounter notes, upcoming appointments, etc.  Non-urgent messages can be sent to your provider as well.   To learn more about what you can do with MyChart, go to [email or web address].    Your next appointment:   4 week(s)  Provider:   Charlsie Quest, NP

## 2022-09-26 IMAGING — CR DG CHEST 2V
2 series · 2 of 2 positions shown · non-contrast
Comparison: October 05, 2020.

CLINICAL DATA: Preop.  Coronary artery disease.

EXAM:
CHEST - 2 VIEW

[w chest pa]
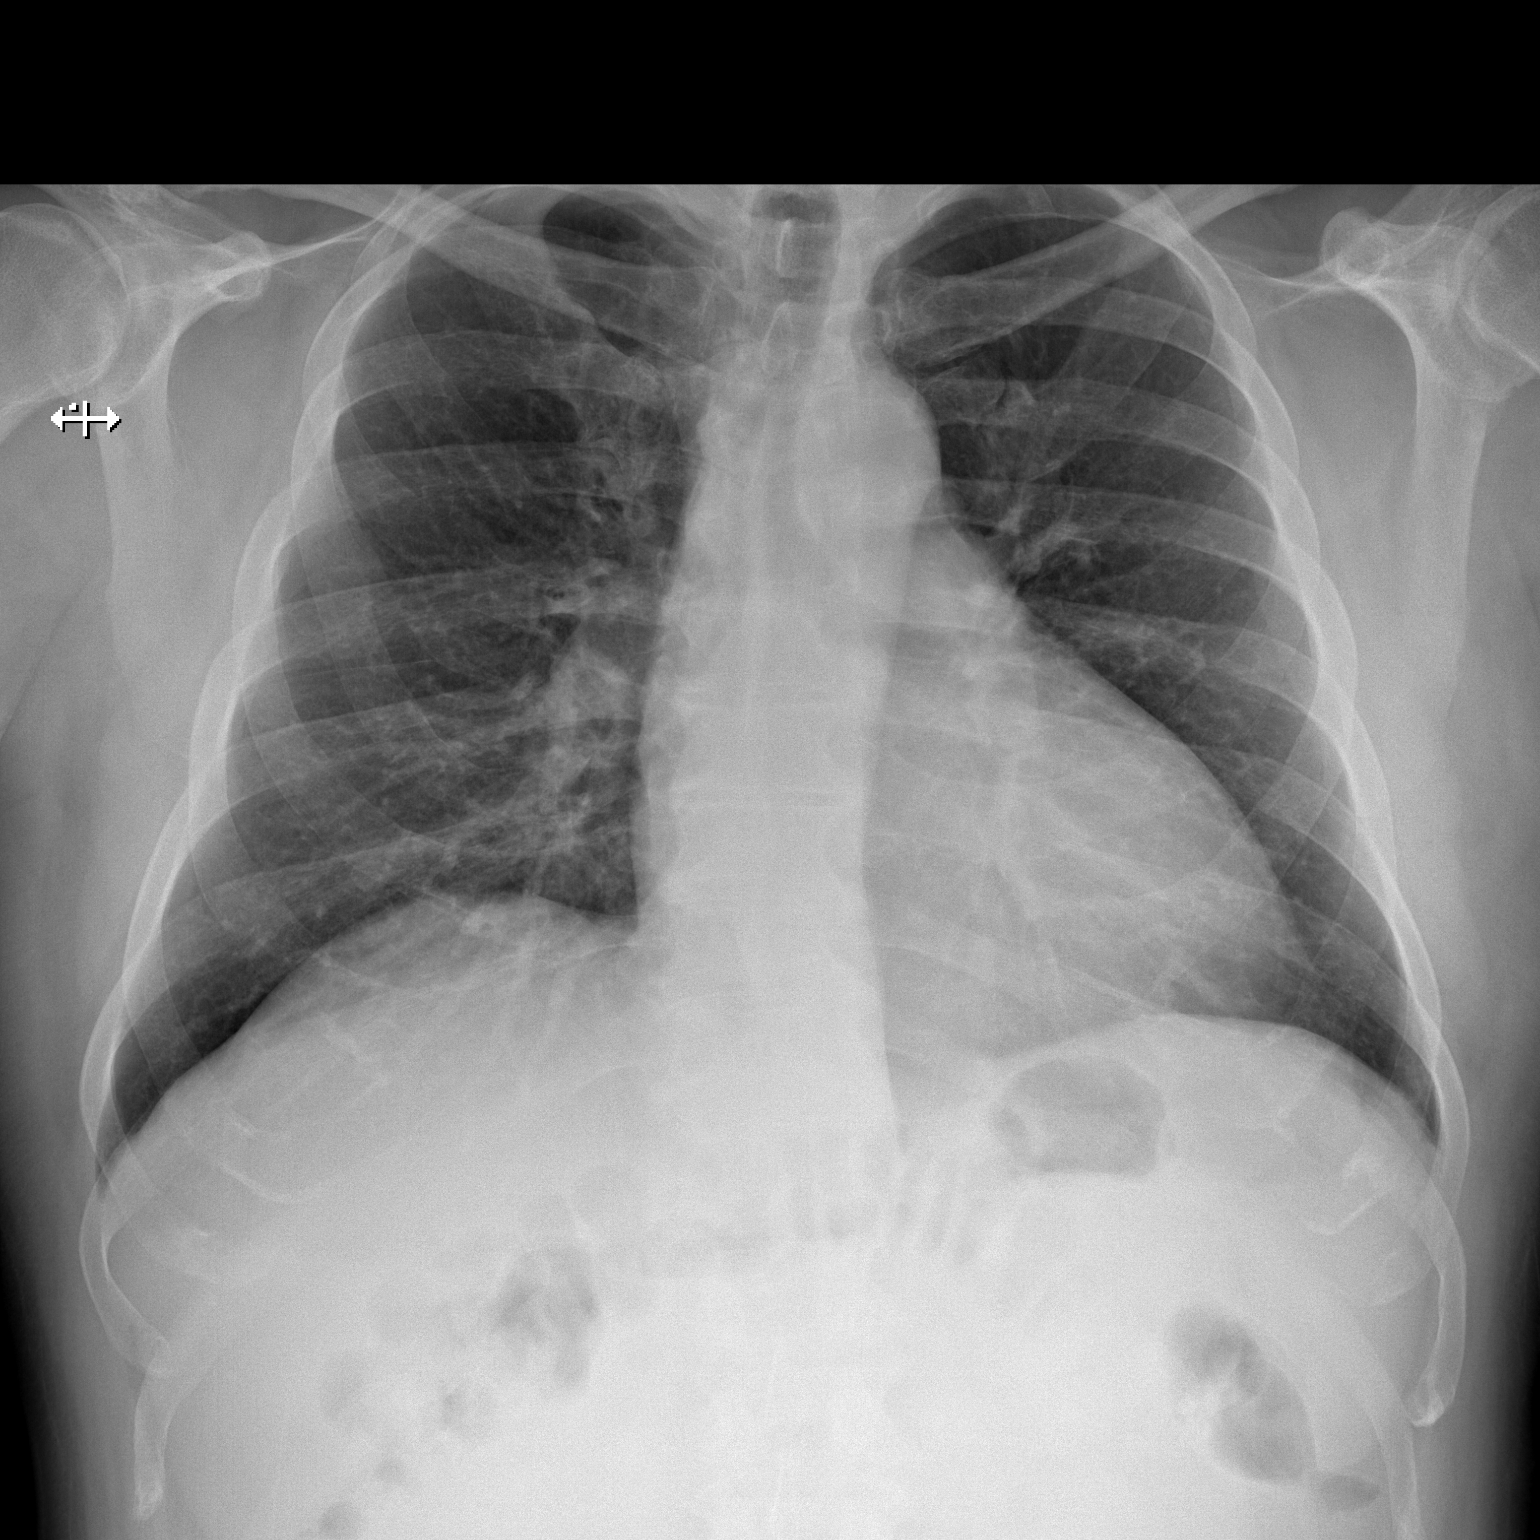

[w chest lat]
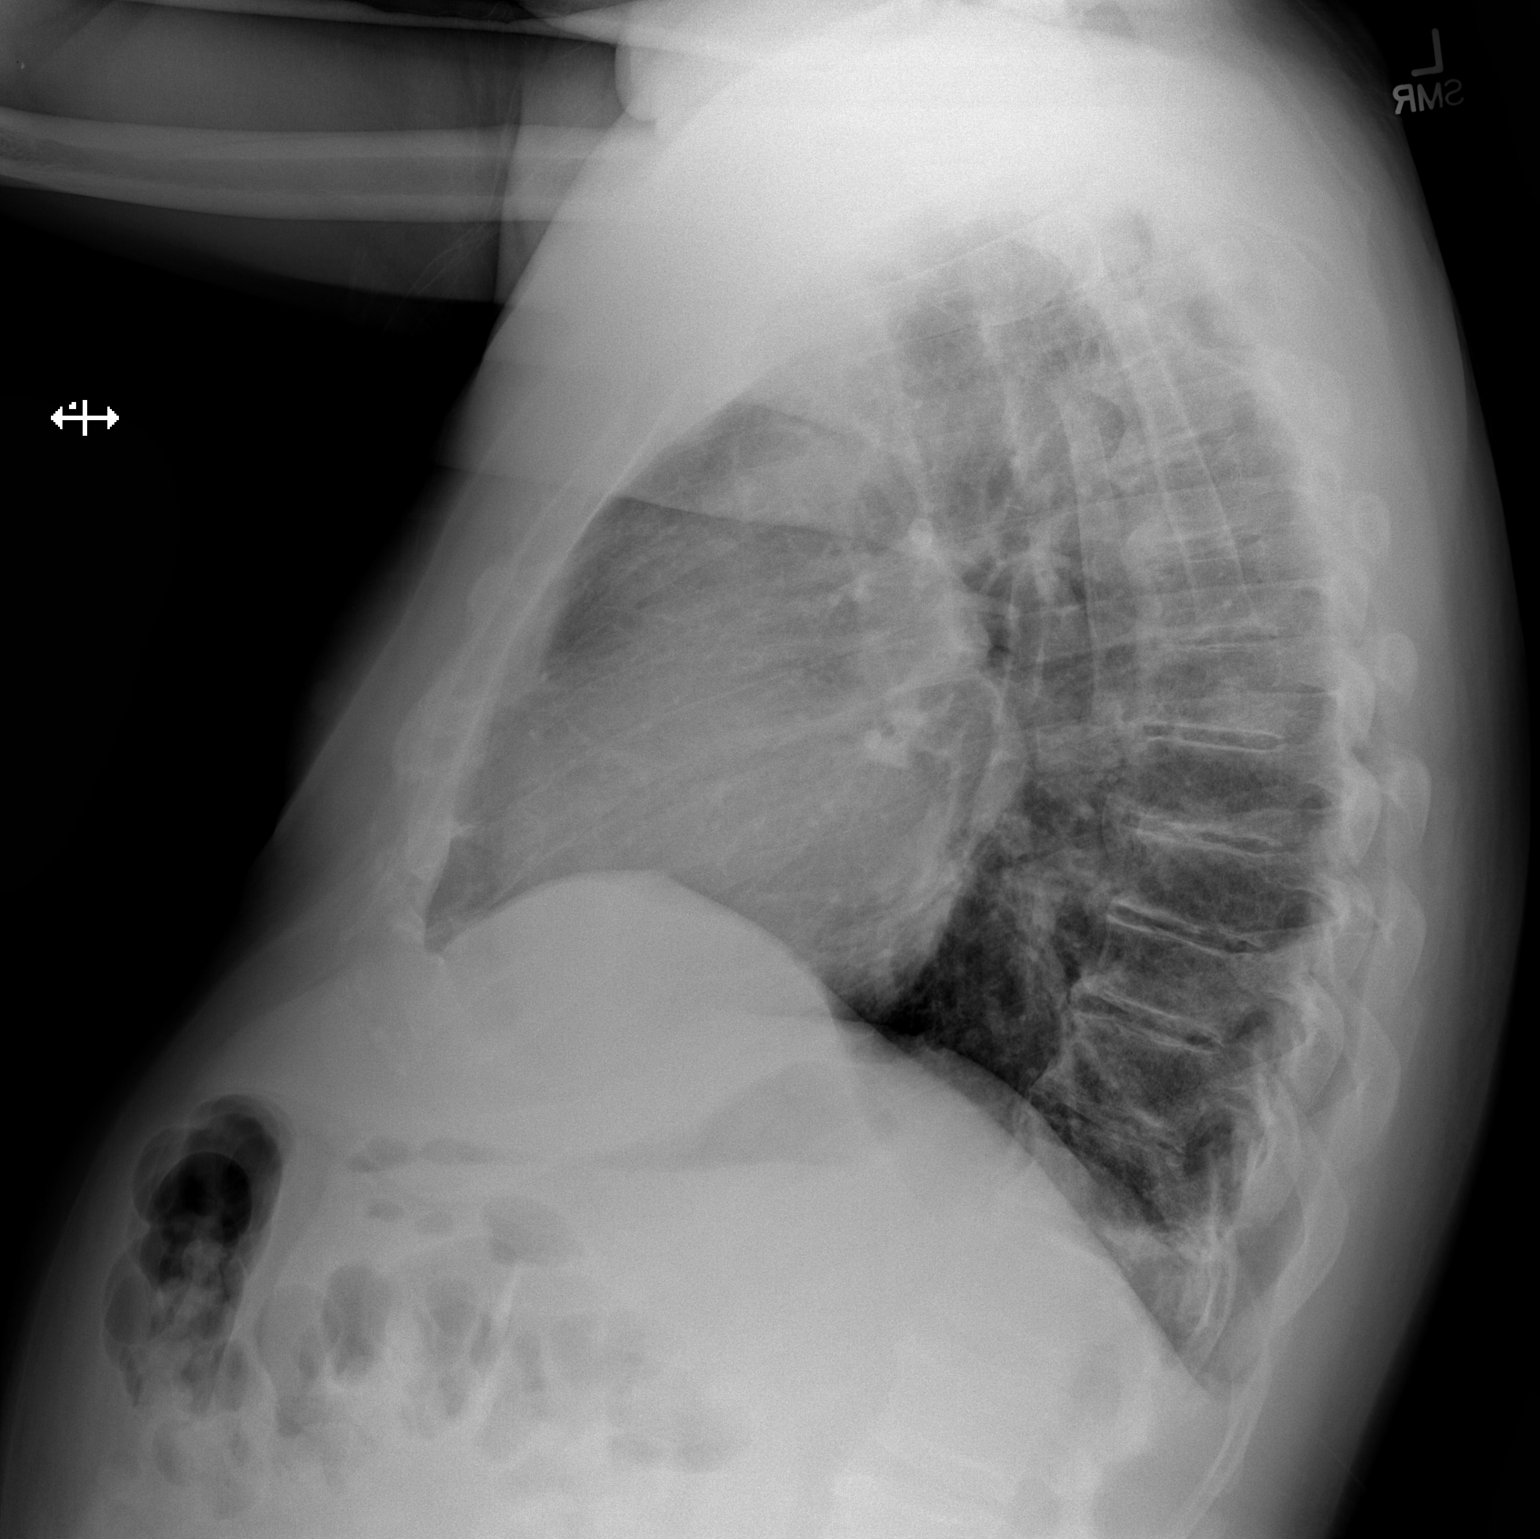

[2 of 2 positions shown; findings below may reference images not displayed]

FINDINGS: The heart size and mediastinal contours are within normal limits.
Both lungs are clear. The visualized skeletal structures are
unremarkable.
IMPRESSION: No active cardiopulmonary disease.

## 2022-10-04 ENCOUNTER — Encounter: Payer: Self-pay | Admitting: Cardiovascular Disease

## 2022-10-04 ENCOUNTER — Ambulatory Visit: Payer: Medicare PPO | Attending: Cardiovascular Disease | Admitting: Cardiovascular Disease

## 2022-10-04 VITALS — BP 120/60 | HR 68 | Ht 68.0 in | Wt 201.1 lb

## 2022-10-04 DIAGNOSIS — I10 Essential (primary) hypertension: Secondary | ICD-10-CM | POA: Diagnosis not present

## 2022-10-04 DIAGNOSIS — I739 Peripheral vascular disease, unspecified: Secondary | ICD-10-CM | POA: Diagnosis not present

## 2022-10-04 DIAGNOSIS — I2510 Atherosclerotic heart disease of native coronary artery without angina pectoris: Secondary | ICD-10-CM | POA: Diagnosis not present

## 2022-10-04 DIAGNOSIS — I5032 Chronic diastolic (congestive) heart failure: Secondary | ICD-10-CM | POA: Diagnosis not present

## 2022-10-04 DIAGNOSIS — E782 Mixed hyperlipidemia: Secondary | ICD-10-CM

## 2022-10-04 NOTE — H&P (View-Only) (Signed)
Cardiology Office Note   Date:  10/04/2022   ID:  Miguel Fletcher, DOB 1956-03-11, MRN [id number]  PCP:  Shane Crutch, PA  Cardiologist:  Dr. Mariah Milling  Chief Complaint  Patient presents with   Follow-up    Symptomatic PV c/o feet burning sensation/Leg pain and fatigue. Meds reviewed verbally with pt.      History of Present Illness: Miguel Fletcher is a 67 y.o. male who was referred for evaluation and management of peripheral arterial disease. He has known history of coronary artery disease status post CABG in 2022, essential hypertension, hyperlipidemia, tobacco use and Bell's palsy.  He has history of poor compliance with medications. He was seen by Dr. Logan Bores in podiatry recently for foot discomfort that was felt to be due to critical limb ischemia and peripheral arterial disease. He reports bilateral leg pain from the knees down that happens with minimal walking.  In addition, he has continuous pain in his feet especially at night and both feet feel that they are on fire.  He has no lower extremity ulceration.  He smokes 1 pack/day.  He underwent lower extremity arterial Doppler studies in April of this year which showed an ABI of 0.84 on the right and 1.06 on the left.  Duplex showed borderline stenosis in the right SFA.   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 08/2020 MRI [facility]): Well-circumscribed T1 hypointense, T2 hyperintense enhancing mass within the right axilla measuring 3.1 x 2.6 x 3.0 cm.  Mass extends along the course of the axillary neurovascular structures.  No evidence for invasion into the adjacent musculature.  No enlarged axillary lymph nodes.   Bell's palsy    Biliary colic    CAD (coronary artery disease)    a. 09/2020 NSTEMI/Cath: LM 60-70d, LAD 70p, D2 60, RI 70p, LCX ild diff dzs, RCA 40-18m, RPDA 50, RPAV 50; b. 10/2020 CABG x 5 (LIMA->LAD, L radial->RI, VG->D1, VG->RPDA->RPL.   Diastolic dysfunction     a. 09/2020 Echo: EF 60-65%, no rwma, sev LVH. Gr1 DD. Nl RV size/fxn. Mod dil LA.   Hemorrhoids    Hypercholesterolemia    Hypertension    Stroke (cerebrum) (HCC)    Tobacco use     Past Surgical History:  Procedure Laterality Date   CORONARY ARTERY BYPASS GRAFT N/A 10/28/2020   Procedure: CORONARY ARTERY BYPASS GRAFTING (CABG), ON PUMP, TIMES FIVE, USING LEFT INTERNAL MAMMARY ARTERY, LEFT RADIAL ARTERY AND ENDOSCOPICALLY HARVESTED RIGHT GREATER SAPHENOUS VEIN;  Surgeon: Loreli Slot, MD;  Location: [facility] OR;  Service: Open Heart Surgery;  Laterality: N/A;   CORONARY ULTRASOUND/IVUS N/A 10/06/2020   Procedure: Intravascular Ultrasound/IVUS;  Surgeon: Tonny Bollman, MD;  [facility] INVASIVE CV LAB;  Service: Cardiovascular;  Laterality: N/A;   ENDOVEIN HARVEST OF GREATER SAPHENOUS VEIN Right 10/28/2020   Procedure: ENDOVEIN HARVEST OF RIGHT GREATER SAPHENOUS VEIN;  Surgeon: Loreli Slot, MD;  Location: [facility] OR;  Service: Open Heart Surgery;  Laterality: Right;   IR THORACENTESIS ASP PLEURAL SPACE W/IMG GUIDE  11/12/2020   LEFT HEART CATH AND CORONARY ANGIOGRAPHY N/A 10/06/2020   Procedure: LEFT HEART CATH AND CORONARY ANGIOGRAPHY;  Surgeon: Tonny Bollman, MD;  Location: [facility] INVASIVE CV LAB;  Service: Cardiovascular;  Laterality: N/A;   RADIAL ARTERY HARVEST Left 10/28/2020   Procedure: LEFT RADIAL ARTERY HARVEST;  Surgeon: Loreli Slot, MD;  Location: [facility] OR;  Service: Open Heart Surgery;  Laterality: Left;  TEE WITHOUT CARDIOVERSION N/A 10/28/2020   Procedure: TRANSESOPHAGEAL ECHOCARDIOGRAM (TEE);  Surgeon: Loreli Slot, MD;  Location: [facility] OR;  Service: Open Heart Surgery;  Laterality: N/A;   VARICOSE VEIN SURGERY Left      Current Outpatient Medications  Medication Sig Dispense Refill   amLODipine (NORVASC) 10 MG tablet Take 1 tablet (10 mg total) by mouth daily. 90 tablet 3   aspirin EC 81 MG tablet Take 1 tablet (81 mg total) by mouth daily. Swallow  whole. 90 tablet 3   atorvastatin (LIPITOR) 80 MG tablet Take 1 tablet (80 mg total) by mouth daily at 8 pm. 90 tablet 3   clotrimazole-betamethasone (LOTRISONE) cream Apply 1 Application topically daily. 45 g 2   doxazosin (CARDURA) 1 MG tablet Take 1 tablet (1 mg total) by mouth daily. 180 tablet 3   lisinopril (ZESTRIL) 40 MG tablet Take 1 tablet (40 mg total) by mouth daily. 90 tablet 3   potassium chloride SA (KLOR-CON M) 20 MEQ tablet Take 1 tablet (20 mEq total) by mouth 2 (two) times daily. 180 tablet 3   terbinafine (LAMISIL) 250 MG tablet Take 1 tablet (250 mg total) by mouth daily. 30 tablet 0   No current facility-administered medications for this visit.    Allergies:   Patient has no known allergies.    Social History:  The patient  reports that he has been smoking cigarettes. He started smoking about 26 years ago. He has a 25 pack-year smoking history. He has never used smokeless tobacco. He reports that he does not currently use drugs. He reports that he does not drink alcohol.   Family History:  The patient's family history includes Alzheimer's disease in his father; Heart disease in his father.    ROS:  Please see the history of present illness.   Otherwise, review of systems are positive for none.   All other systems are reviewed and negative.    PHYSICAL EXAM: VS:  BP 120/60 (BP Location: Left Arm, Patient Position: Sitting, Cuff Size: Normal)   Pulse 68   Ht 5\' 8"  (1.727 m)   Wt 201 lb 2 oz (91.2 kg)   SpO2 98%   BMI 30.58 kg/m  , BMI Body mass index is 30.58 kg/m. GEN: Well nourished, well developed, in no acute distress  HEENT: normal  Neck: no JVD, carotid bruits, or masses Cardiac: RRR; no murmurs, rubs, or gallops,no edema  Respiratory:  clear to auscultation bilaterally, normal work of breathing GI: soft, nontender, nondistended, + BS MS: no deformity or atrophy  Skin: warm and dry, no rash Neuro:  Strength and sensation are intact Psych: euthymic  mood, full affect Vascular: Femoral pulse: Normal bilaterally.  Distal pulses are not palpable.   EKG:  EKG is not ordered today.    Recent Labs: 12/21/2021: ALT 12; Magnesium 1.9; Magnesium 2.0 01/25/2022: BUN 15; Creatinine, Ser 1.17; Hemoglobin 14.1; Platelets 196; Potassium 2.9; Sodium 138    Lipid Panel    Component Value Date/Time   CHOL 72 (L) 11/09/2020 1429   TRIG 87 11/09/2020 1429   HDL 21 (L) 11/09/2020 1429   CHOLHDL 3.4 11/09/2020 1429   CHOLHDL 3.8 10/06/2020 0041   VLDL 9 10/06/2020 0041   LDLCALC 33 11/09/2020 1429   LDLDIRECT 30 11/09/2020 1429      Wt Readings from Last 3 Encounters:  10/04/22 201 lb 2 oz (91.2 kg)  09/23/22 200 lb 6.4 oz (90.9 kg)  02/22/22 208 lb (94.3 kg)  No data to display            ASSESSMENT AND PLAN:  1.  Peripheral arterial disease: Bilateral foot rest pain without ulceration.  In addition, he has bilateral calf claudication.  Given severity of his symptoms, I recommend proceeding with an abdominal aortogram with lower extremity angiography and possible endovascular intervention.  I discussed the procedure in details as well as risk and benefits.  2.  Coronary artery disease status post CABG in 2022: No convincing evidence of angina at this time.  3.  Essential hypertension: Blood pressure was elevated recently but seems to be controlled today.  4.  Chronic diastolic heart failure: He appears to be euvolemic.  5.  Mixed hyperlipidemia: Continue high-dose atorvastatin.  Most recent lipid profile showed an LDL of 33.    Disposition:   Proceed with abdominal aortogram with lower extremity angiography.  Follow-up after.  Signed,  Lorine Bears, MD  10/04/2022 5:04 PM    [facility]

## 2022-10-04 NOTE — Patient Instructions (Signed)
Medication Instructions:  No changes *If you need a refill on your cardiac medications before your next appointment, please call your pharmacy*   Lab Work: Your provider would like for you to have following labs drawn: BMET and CBC.   Please go to the [facility] entrance and check in at the front desk.  You do not need an appointment.  They are open from 7am-6 pm.   If you have labs (blood work) drawn today and your tests are completely normal, you will receive your results only by: MyChart Message (if you have MyChart) OR A paper copy in the mail If you have any lab test that is abnormal or we need to change your treatment, we will call you to review the results.   Testing/Procedures: Your physician has requested that you have a peripheral vascular angiogram. This exam is performed at the hospital. During this exam IV contrast is used to look at arterial blood flow. Please review the information sheet given for details.    Follow-Up: At [facility], you and your health needs are our priority.  As part of our continuing mission to provide you with exceptional heart care, we have created designated Provider Care Teams.  These Care Teams include your primary Cardiologist (physician) and Advanced Practice Providers (APPs -  Physician Assistants and Nurse Practitioners) who all work together to provide you with the care you need, when you need it.  We recommend signing up for the patient portal called "MyChart".  Sign up information is provided on this After Visit Summary.  MyChart is used to connect with patients for Virtual Visits (Telemedicine).  Patients are able to view lab/test results, encounter notes, upcoming appointments, etc.  Non-urgent messages can be sent to your provider as well.   To learn more about what you can do with MyChart, go to [email or web address].    Your next appointment:   1 month(s)  Provider:   You may see Dr. Kirke Corin or one of the  following Advanced Practice Providers on your designated Care Team:   Nicolasa Ducking, NP Eula Listen, PA-C Cadence Fransico Michael, PA-C Charlsie Quest, NP    Other Instructions  [facility] A DEPT OF [place] AT [facility] [address] [place] [address] Dept: [phone] Loc: [phone]  COBI DELPH  10/04/2022  You are scheduled for a Peripheral Angiogram on Wednesday, July 24 with Dr. Lorine Bears.  1. Please arrive at the [facility] (Main Entrance A) at [facility]: [address] [place], [place] [address] at 12:30 PM (This time is 2 hour(s) before your procedure to ensure your preparation). Free valet parking service is available. You will check in at ADMITTING. The support person will be asked to wait in the waiting room.  It is OK to have someone drop you off and come back when you are ready to be discharged.    Special note: Every effort is made to have your procedure done on time. Please understand that emergencies sometimes delay scheduled procedures.  2. Diet: Do not eat solid foods after midnight.  The patient may have clear liquids until 5am upon the day of the procedure.  3. Labs: You will need to have blood drawn on 10/04/22 You do not need to be fasting.  4. Medication instructions in preparation for your procedure: Nothing to hold  On the morning of your procedure, take your Aspirin 81 mg and any morning  medicines NOT listed above.  You may use sips of water.  5. Plan to go home the same day, you will only stay overnight if medically necessary. 6. Bring a current list of your medications and current insurance cards. 7. You MUST have a responsible person to drive you home. 8. Someone MUST be with you the first 24 hours after you arrive home or your discharge will be delayed. 9. Please wear clothes that are easy to get on and off and wear slip-on shoes.  Thank you for  allowing [place] to care for you!   -- [facility]

## 2022-10-04 NOTE — Progress Notes (Signed)
Cardiology Office Note   Date:  10/04/2022   ID:  Miguel Fletcher, DOB 1956-03-11, MRN [id number]  PCP:  Shane Crutch, PA  Cardiologist:  Dr. Mariah Milling  Chief Complaint  Patient presents with   Follow-up    Symptomatic PV c/o feet burning sensation/Leg pain and fatigue. Meds reviewed verbally with pt.      History of Present Illness: Miguel Fletcher is a 67 y.o. male who was referred for evaluation and management of peripheral arterial disease. He has known history of coronary artery disease status post CABG in 2022, essential hypertension, hyperlipidemia, tobacco use and Bell's palsy.  He has history of poor compliance with medications. He was seen by Dr. Logan Bores in podiatry recently for foot discomfort that was felt to be due to critical limb ischemia and peripheral arterial disease. He reports bilateral leg pain from the knees down that happens with minimal walking.  In addition, he has continuous pain in his feet especially at night and both feet feel that they are on fire.  He has no lower extremity ulceration.  He smokes 1 pack/day.  He underwent lower extremity arterial Doppler studies in April of this year which showed an ABI of 0.84 on the right and 1.06 on the left.  Duplex showed borderline stenosis in the right SFA.   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 08/2020 MRI [facility]): Well-circumscribed T1 hypointense, T2 hyperintense enhancing mass within the right axilla measuring 3.1 x 2.6 x 3.0 cm.  Mass extends along the course of the axillary neurovascular structures.  No evidence for invasion into the adjacent musculature.  No enlarged axillary lymph nodes.   Bell's palsy    Biliary colic    CAD (coronary artery disease)    a. 09/2020 NSTEMI/Cath: LM 60-70d, LAD 70p, D2 60, RI 70p, LCX ild diff dzs, RCA 40-18m, RPDA 50, RPAV 50; b. 10/2020 CABG x 5 (LIMA->LAD, L radial->RI, VG->D1, VG->RPDA->RPL.   Diastolic dysfunction     a. 09/2020 Echo: EF 60-65%, no rwma, sev LVH. Gr1 DD. Nl RV size/fxn. Mod dil LA.   Hemorrhoids    Hypercholesterolemia    Hypertension    Stroke (cerebrum) (HCC)    Tobacco use     Past Surgical History:  Procedure Laterality Date   CORONARY ARTERY BYPASS GRAFT N/A 10/28/2020   Procedure: CORONARY ARTERY BYPASS GRAFTING (CABG), ON PUMP, TIMES FIVE, USING LEFT INTERNAL MAMMARY ARTERY, LEFT RADIAL ARTERY AND ENDOSCOPICALLY HARVESTED RIGHT GREATER SAPHENOUS VEIN;  Surgeon: Loreli Slot, MD;  Location: [facility] OR;  Service: Open Heart Surgery;  Laterality: N/A;   CORONARY ULTRASOUND/IVUS N/A 10/06/2020   Procedure: Intravascular Ultrasound/IVUS;  Surgeon: Tonny Bollman, MD;  [facility] INVASIVE CV LAB;  Service: Cardiovascular;  Laterality: N/A;   ENDOVEIN HARVEST OF GREATER SAPHENOUS VEIN Right 10/28/2020   Procedure: ENDOVEIN HARVEST OF RIGHT GREATER SAPHENOUS VEIN;  Surgeon: Loreli Slot, MD;  Location: [facility] OR;  Service: Open Heart Surgery;  Laterality: Right;   IR THORACENTESIS ASP PLEURAL SPACE W/IMG GUIDE  11/12/2020   LEFT HEART CATH AND CORONARY ANGIOGRAPHY N/A 10/06/2020   Procedure: LEFT HEART CATH AND CORONARY ANGIOGRAPHY;  Surgeon: Tonny Bollman, MD;  Location: [facility] INVASIVE CV LAB;  Service: Cardiovascular;  Laterality: N/A;   RADIAL ARTERY HARVEST Left 10/28/2020   Procedure: LEFT RADIAL ARTERY HARVEST;  Surgeon: Loreli Slot, MD;  Location: [facility] OR;  Service: Open Heart Surgery;  Laterality: Left;  TEE WITHOUT CARDIOVERSION N/A 10/28/2020   Procedure: TRANSESOPHAGEAL ECHOCARDIOGRAM (TEE);  Surgeon: Loreli Slot, MD;  Location: [facility] OR;  Service: Open Heart Surgery;  Laterality: N/A;   VARICOSE VEIN SURGERY Left      Current Outpatient Medications  Medication Sig Dispense Refill   amLODipine (NORVASC) 10 MG tablet Take 1 tablet (10 mg total) by mouth daily. 90 tablet 3   aspirin EC 81 MG tablet Take 1 tablet (81 mg total) by mouth daily. Swallow  whole. 90 tablet 3   atorvastatin (LIPITOR) 80 MG tablet Take 1 tablet (80 mg total) by mouth daily at 8 pm. 90 tablet 3   clotrimazole-betamethasone (LOTRISONE) cream Apply 1 Application topically daily. 45 g 2   doxazosin (CARDURA) 1 MG tablet Take 1 tablet (1 mg total) by mouth daily. 180 tablet 3   lisinopril (ZESTRIL) 40 MG tablet Take 1 tablet (40 mg total) by mouth daily. 90 tablet 3   potassium chloride SA (KLOR-CON M) 20 MEQ tablet Take 1 tablet (20 mEq total) by mouth 2 (two) times daily. 180 tablet 3   terbinafine (LAMISIL) 250 MG tablet Take 1 tablet (250 mg total) by mouth daily. 30 tablet 0   No current facility-administered medications for this visit.    Allergies:   Patient has no known allergies.    Social History:  The patient  reports that he has been smoking cigarettes. He started smoking about 26 years ago. He has a 25 pack-year smoking history. He has never used smokeless tobacco. He reports that he does not currently use drugs. He reports that he does not drink alcohol.   Family History:  The patient's family history includes Alzheimer's disease in his father; Heart disease in his father.    ROS:  Please see the history of present illness.   Otherwise, review of systems are positive for none.   All other systems are reviewed and negative.    PHYSICAL EXAM: VS:  BP 120/60 (BP Location: Left Arm, Patient Position: Sitting, Cuff Size: Normal)   Pulse 68   Ht 5\' 8"  (1.727 m)   Wt 201 lb 2 oz (91.2 kg)   SpO2 98%   BMI 30.58 kg/m  , BMI Body mass index is 30.58 kg/m. GEN: Well nourished, well developed, in no acute distress  HEENT: normal  Neck: no JVD, carotid bruits, or masses Cardiac: RRR; no murmurs, rubs, or gallops,no edema  Respiratory:  clear to auscultation bilaterally, normal work of breathing GI: soft, nontender, nondistended, + BS MS: no deformity or atrophy  Skin: warm and dry, no rash Neuro:  Strength and sensation are intact Psych: euthymic  mood, full affect Vascular: Femoral pulse: Normal bilaterally.  Distal pulses are not palpable.   EKG:  EKG is not ordered today.    Recent Labs: 12/21/2021: ALT 12; Magnesium 1.9; Magnesium 2.0 01/25/2022: BUN 15; Creatinine, Ser 1.17; Hemoglobin 14.1; Platelets 196; Potassium 2.9; Sodium 138    Lipid Panel    Component Value Date/Time   CHOL 72 (L) 11/09/2020 1429   TRIG 87 11/09/2020 1429   HDL 21 (L) 11/09/2020 1429   CHOLHDL 3.4 11/09/2020 1429   CHOLHDL 3.8 10/06/2020 0041   VLDL 9 10/06/2020 0041   LDLCALC 33 11/09/2020 1429   LDLDIRECT 30 11/09/2020 1429      Wt Readings from Last 3 Encounters:  10/04/22 201 lb 2 oz (91.2 kg)  09/23/22 200 lb 6.4 oz (90.9 kg)  02/22/22 208 lb (94.3 kg)  No data to display            ASSESSMENT AND PLAN:  1.  Peripheral arterial disease: Bilateral foot rest pain without ulceration.  In addition, he has bilateral calf claudication.  Given severity of his symptoms, I recommend proceeding with an abdominal aortogram with lower extremity angiography and possible endovascular intervention.  I discussed the procedure in details as well as risk and benefits.  2.  Coronary artery disease status post CABG in 2022: No convincing evidence of angina at this time.  3.  Essential hypertension: Blood pressure was elevated recently but seems to be controlled today.  4.  Chronic diastolic heart failure: He appears to be euvolemic.  5.  Mixed hyperlipidemia: Continue high-dose atorvastatin.  Most recent lipid profile showed an LDL of 33.    Disposition:   Proceed with abdominal aortogram with lower extremity angiography.  Follow-up after.  Signed,  Lorine Bears, MD  10/04/2022 5:04 PM    [facility]

## 2022-10-07 ENCOUNTER — Ambulatory Visit
Admission: RE | Admit: 2022-10-07 | Discharge: 2022-10-07 | Disposition: A | Discharge status: Home / Self Care | Payer: Medicare PPO | Source: Ambulatory Visit | Attending: Cardiology | Admitting: Cardiology

## 2022-10-07 ENCOUNTER — Telehealth: Payer: Self-pay | Admitting: Cardiovascular Disease

## 2022-10-07 ENCOUNTER — Ambulatory Visit
Admission: RE | Admit: 2022-10-07 | Discharge: 2022-10-07 | Disposition: A | Discharge status: Home / Self Care | Payer: Medicare PPO | Attending: Cardiology | Admitting: Cardiology

## 2022-10-07 ENCOUNTER — Other Ambulatory Visit
Admission: RE | Admit: 2022-10-07 | Discharge: 2022-10-07 | Disposition: A | Discharge status: Home / Self Care | Payer: Medicare PPO | Source: Ambulatory Visit | Attending: Cardiology | Admitting: Cardiology

## 2022-10-07 DIAGNOSIS — R079 Chest pain, unspecified: Secondary | ICD-10-CM

## 2022-10-07 DIAGNOSIS — I2510 Atherosclerotic heart disease of native coronary artery without angina pectoris: Secondary | ICD-10-CM | POA: Insufficient documentation

## 2022-10-07 DIAGNOSIS — I739 Peripheral vascular disease, unspecified: Secondary | ICD-10-CM | POA: Insufficient documentation

## 2022-10-07 LAB — CBC
HCT: 41.2 % (ref 39.0–52.0)
Hemoglobin: 14.4 g/dL (ref 13.0–17.0)
MCH: 31.3 pg (ref 26.0–34.0)
MCHC: 35 g/dL (ref 30.0–36.0)
MCV: 89.6 fL (ref 80.0–100.0)
Platelets: 210 10*3/uL (ref 150–400)
RBC: 4.6 MIL/uL (ref 4.22–5.81)
RDW: 13.2 % (ref 11.5–15.5)
WBC: 5.8 10*3/uL (ref 4.0–10.5)
nRBC: 0 % (ref 0.0–0.2)

## 2022-10-07 LAB — BASIC METABOLIC PANEL
Anion gap: 10 (ref 5–15)
BUN: 13 mg/dL (ref 8–23)
CO2: 27 mmol/L (ref 22–32)
Calcium: 8.6 mg/dL — ABNORMAL LOW (ref 8.9–10.3)
Chloride: 103 mmol/L (ref 98–111)
Creatinine, Ser: 1.05 mg/dL (ref 0.61–1.24)
GFR, Estimated: >60 mL/min (ref >60)
Glucose, Bld: 117 mg/dL — ABNORMAL HIGH (ref 70–99)
Potassium: 2.4 mmol/L — CL (ref 3.5–5.1)
Sodium: 140 mmol/L (ref 135–145)

## 2022-10-07 NOTE — Telephone Encounter (Signed)
CRITICAL VALUE STICKER  CRITICAL VALUE: Potassium 2.4   DATE & TIME NOTIFIED: 10/07/22 10:45 am  MESSENGER (representative from lab): Gunnar Fusi from [facility] lab  Message sent to provider for recommendations.

## 2022-10-07 NOTE — Telephone Encounter (Signed)
Gunnar Fusi with [facility] is calling to report a critical Potassium level.

## 2022-10-10 ENCOUNTER — Telehealth: Payer: Self-pay | Admitting: *Deleted

## 2022-10-10 MED ORDER — SPIRONOLACTONE 25 MG PO TABS: 25.0000 mg | ORAL_TABLET | Freq: Every day | ORAL | 1 refills | Status: DC

## 2022-10-10 NOTE — Telephone Encounter (Signed)
-----   Message from Lorine Bears sent at 10/07/2022  3:02 PM EDT ----- Very low potassium. Stop Cardura and start Spironolactone 25 mg once daily. Make sure he is taking KCL 20 meq bid.  Repeat BMP on Monday

## 2022-10-10 NOTE — Telephone Encounter (Signed)
Patient has been notified of his results. Unsuccessful attempts were made last Friday to reach him.  He is due for an abdominal angiography on Wednesday of this week.  He has been advised to stop the Doxazosin and start Spironolactone 25 mg once daily. He has not been taking the potassium bid due to stomach upset. He had only been taking once daily. He has been advised to take it as directed. He had already taken the Doxazosin this morning.

## 2022-10-10 NOTE — Telephone Encounter (Signed)
Patient was calling to verify what medication that she should discontinue. He has been advised.

## 2022-10-10 NOTE — Telephone Encounter (Signed)
Pt calling back to speak with nurse again

## 2022-10-11 ENCOUNTER — Telehealth: Payer: Self-pay | Admitting: *Deleted

## 2022-10-11 NOTE — Telephone Encounter (Signed)
Abdominal aortogram scheduled at [facility] for: Wednesday October 12, 2022 2:30 PM Arrival time [facility] Main Entrance A at: 12 Noon-needs BMP on arrival  Nothing to eat after midnight prior to procedure, clear liquids until 5 AM day of procedure.  Medication instructions: -Hold:  Spironolactone-AM of procedure -Other usual morning medications can be taken with sips of water including aspirin 81 mg.  Confirmed patient has responsible adult to drive home post procedure and be with patient first 24 hours after arriving home. Patient reports he will drive to hospital, have someone pick him up when discharged, his wife will be at his home if he is same day discharge.  Plan to go home the same day, you will only stay overnight if medically necessary.  Reviewed procedure instructions with patient.  10/07/22 BMP K 2.4-see lab results and phone note: Patient reports he made medication changes per 10/10/22 phone note and did take KCl 20 mEq bid yesterday and knows he should continue taking bid.  Per 10/11/22 Secure Chat message from RN: BMET on arrival at short stay (per Dr. Kirke Corin).

## 2022-10-12 ENCOUNTER — Ambulatory Visit (HOSPITAL_COMMUNITY)
Admission: RE | Admit: 2022-10-12 | Discharge: 2022-10-12 | Disposition: A | Discharge status: Home / Self Care | Payer: Medicare PPO | Attending: Cardiovascular Disease | Admitting: Cardiovascular Disease

## 2022-10-12 ENCOUNTER — Encounter (HOSPITAL_COMMUNITY)
Admission: RE | Disposition: A | Discharge status: Home / Self Care | Payer: Self-pay | Source: Home / Self Care | Attending: Cardiovascular Disease

## 2022-10-12 ENCOUNTER — Other Ambulatory Visit: Payer: Self-pay

## 2022-10-12 DIAGNOSIS — F17210 Nicotine dependence, cigarettes, uncomplicated: Secondary | ICD-10-CM | POA: Diagnosis not present

## 2022-10-12 DIAGNOSIS — I5032 Chronic diastolic (congestive) heart failure: Secondary | ICD-10-CM | POA: Diagnosis not present

## 2022-10-12 DIAGNOSIS — Z951 Presence of aortocoronary bypass graft: Secondary | ICD-10-CM | POA: Insufficient documentation

## 2022-10-12 DIAGNOSIS — I110 Hypertensive heart disease with heart failure: Secondary | ICD-10-CM | POA: Insufficient documentation

## 2022-10-12 DIAGNOSIS — I2510 Atherosclerotic heart disease of native coronary artery without angina pectoris: Secondary | ICD-10-CM | POA: Insufficient documentation

## 2022-10-12 DIAGNOSIS — E782 Mixed hyperlipidemia: Secondary | ICD-10-CM | POA: Diagnosis not present

## 2022-10-12 DIAGNOSIS — I739 Peripheral vascular disease, unspecified: Secondary | ICD-10-CM | POA: Diagnosis not present

## 2022-10-12 DIAGNOSIS — Z01812 Encounter for preprocedural laboratory examination: Secondary | ICD-10-CM

## 2022-10-12 DIAGNOSIS — Z79899 Other long term (current) drug therapy: Secondary | ICD-10-CM | POA: Insufficient documentation

## 2022-10-12 DIAGNOSIS — I70213 Atherosclerosis of native arteries of extremities with intermittent claudication, bilateral legs: Secondary | ICD-10-CM | POA: Insufficient documentation

## 2022-10-12 DIAGNOSIS — E876 Hypokalemia: Secondary | ICD-10-CM

## 2022-10-12 HISTORY — PX: ABDOMINAL AORTOGRAM W/LOWER EXTREMITY: CATH118223

## 2022-10-12 LAB — POCT I-STAT, CHEM 8
BUN: 11 mg/dL (ref 8–23)
BUN: 10 mg/dL (ref 8–23)
Calcium, Ion: 1.08 mmol/L — ABNORMAL LOW (ref 1.15–1.40)
Calcium, Ion: 1.2 mmol/L (ref 1.15–1.40)
Chloride: 101 mmol/L (ref 98–111)
Chloride: 102 mmol/L (ref 98–111)
Creatinine, Ser: 1.1 mg/dL (ref 0.61–1.24)
Creatinine, Ser: 0.9 mg/dL (ref 0.61–1.24)
Glucose, Bld: 86 mg/dL (ref 70–99)
Glucose, Bld: 88 mg/dL (ref 70–99)
HCT: 42 % (ref 39.0–52.0)
HCT: 39 % (ref 39.0–52.0)
Hemoglobin: 14.3 g/dL (ref 13.0–17.0)
Hemoglobin: 13.3 g/dL (ref 13.0–17.0)
Potassium: 2.9 mmol/L — ABNORMAL LOW (ref 3.5–5.1)
Potassium: 3.1 mmol/L — ABNORMAL LOW (ref 3.5–5.1)
Sodium: 143 mmol/L (ref 135–145)
Sodium: 143 mmol/L (ref 135–145)
TCO2: 29 mmol/L (ref 22–32)
TCO2: 33 mmol/L — ABNORMAL HIGH (ref 22–32)

## 2022-10-12 LAB — BASIC METABOLIC PANEL
Anion gap: 9 (ref 5–15)
BUN: 12 mg/dL (ref 8–23)
CO2: 29 mmol/L (ref 22–32)
Calcium: 8.9 mg/dL (ref 8.9–10.3)
Chloride: 100 mmol/L (ref 98–111)
Creatinine, Ser: 1.13 mg/dL (ref 0.61–1.24)
GFR, Estimated: >60 mL/min (ref >60)
Glucose, Bld: 83 mg/dL (ref 70–99)
Potassium: 2.8 mmol/L — ABNORMAL LOW (ref 3.5–5.1)
Sodium: 138 mmol/L (ref 135–145)

## 2022-10-12 SURGERY — ABDOMINAL AORTOGRAM W/LOWER EXTREMITY
Anesthesia: LOCAL | Laterality: Bilateral

## 2022-10-12 MED ORDER — IODIXANOL 320 MG/ML IV SOLN
INTRAVENOUS | Status: DC | PRN
  Administered 2022-10-12: 77 mL

## 2022-10-12 MED ORDER — ACETAMINOPHEN 325 MG PO TABS: 650.0000 mg | ORAL_TABLET | ORAL | Status: DC | PRN

## 2022-10-12 MED ORDER — HEPARIN (PORCINE) IN NACL 1000-0.9 UT/500ML-% IV SOLN
INTRAVENOUS | Status: DC | PRN
  Administered 2022-10-12 (×2): 500 mL

## 2022-10-12 MED ORDER — FENTANYL CITRATE (PF) 100 MCG/2ML IJ SOLN
INTRAMUSCULAR | Status: AC
  Filled 2022-10-12: qty 2

## 2022-10-12 MED ORDER — LIDOCAINE HCL (PF) 1 % IJ SOLN
INTRAMUSCULAR | Status: DC | PRN
  Administered 2022-10-12: 15 mL

## 2022-10-12 MED ORDER — HYDRALAZINE HCL 20 MG/ML IJ SOLN
5.0000 mg | INTRAMUSCULAR | Status: DC | PRN
  Administered 2022-10-12: 5 mg via INTRAVENOUS
  Filled 2022-10-12: qty 1

## 2022-10-12 MED ORDER — SODIUM CHLORIDE 0.9 % WEIGHT BASED INFUSION
3.0000 mL/kg/h | INTRAVENOUS | Status: AC
  Administered 2022-10-12: 3 mL/kg/h via INTRAVENOUS

## 2022-10-12 MED ORDER — SODIUM CHLORIDE 0.9 % WEIGHT BASED INFUSION: 1.0000 mL/kg/h | INTRAVENOUS | Status: DC

## 2022-10-12 MED ORDER — LIDOCAINE HCL (PF) 1 % IJ SOLN
INTRAMUSCULAR | Status: AC
  Filled 2022-10-12: qty 30

## 2022-10-12 MED ORDER — ASPIRIN 81 MG PO CHEW: 81.0000 mg | CHEWABLE_TABLET | ORAL | Status: DC

## 2022-10-12 MED ORDER — SODIUM CHLORIDE 0.9% FLUSH: 3.0000 mL | INTRAVENOUS | Status: DC | PRN

## 2022-10-12 MED ORDER — SODIUM CHLORIDE 0.9% FLUSH: 3.0000 mL | Freq: Two times a day (BID) | INTRAVENOUS | Status: DC

## 2022-10-12 MED ORDER — AMIODARONE HCL IN DEXTROSE 360-4.14 MG/200ML-% IV SOLN
INTRAVENOUS | Status: AC
  Filled 2022-10-12: qty 200

## 2022-10-12 MED ORDER — MIDAZOLAM HCL 2 MG/2ML IJ SOLN
INTRAMUSCULAR | Status: DC | PRN
  Administered 2022-10-12: 1 mg via INTRAVENOUS

## 2022-10-12 MED ORDER — CILOSTAZOL 50 MG PO TABS: 50.0000 mg | ORAL_TABLET | Freq: Two times a day (BID) | ORAL | 3 refills | Status: DC

## 2022-10-12 MED ORDER — MIDAZOLAM HCL 2 MG/2ML IJ SOLN
INTRAMUSCULAR | Status: AC
  Filled 2022-10-12: qty 2

## 2022-10-12 MED ORDER — POTASSIUM CHLORIDE CRYS ER 20 MEQ PO TBCR
40.0000 meq | EXTENDED_RELEASE_TABLET | Freq: Once | ORAL | Status: AC
  Administered 2022-10-12: 40 meq via ORAL
  Filled 2022-10-12: qty 2

## 2022-10-12 MED ORDER — SODIUM CHLORIDE 0.9 % IV SOLN: 250.0000 mL | INTRAVENOUS | Status: DC | PRN

## 2022-10-12 MED ORDER — ONDANSETRON HCL 4 MG/2ML IJ SOLN: 4.0000 mg | Freq: Four times a day (QID) | INTRAMUSCULAR | Status: DC | PRN

## 2022-10-12 MED ORDER — SODIUM CHLORIDE 0.9 % IV SOLN: INTRAVENOUS | Status: DC

## 2022-10-12 MED ORDER — LABETALOL HCL 5 MG/ML IV SOLN: 10.0000 mg | INTRAVENOUS | Status: DC | PRN

## 2022-10-12 MED ORDER — FENTANYL CITRATE (PF) 100 MCG/2ML IJ SOLN
INTRAMUSCULAR | Status: DC | PRN
  Administered 2022-10-12: 50 ug via INTRAVENOUS

## 2022-10-12 SURGICAL SUPPLY — 14 items
CATH ANGIO 5F PIGTAIL 65CM (CATHETERS) IMPLANT
CATH CROSS OVER TEMPO 5F (CATHETERS) IMPLANT
CATH STRAIGHT 5FR 65CM (CATHETERS) IMPLANT
COVER DOME SNAP 22 D (MISCELLANEOUS) IMPLANT
DEVICE CLOSURE MYNXGRIP 5F (Vascular Products) IMPLANT
KIT MICROPUNCTURE NIT STIFF (SHEATH) IMPLANT
KIT SINGLE USE MANIFOLD (KITS) IMPLANT
PROTECTION STATION PRESSURIZED (MISCELLANEOUS) ×1
SET ATX-X65L (MISCELLANEOUS) IMPLANT
SHEATH PINNACLE 5F 10CM (SHEATH) IMPLANT
SHEATH PROBE COVER 6X72 (BAG) IMPLANT
STATION PROTECTION PRESSURIZED (MISCELLANEOUS) IMPLANT
TRAY PV CATH (CUSTOM PROCEDURE TRAY) ×1 IMPLANT
WIRE HITORQ VERSACORE ST 145CM (WIRE) IMPLANT

## 2022-10-12 NOTE — Interval H&P Note (Signed)
History and Physical Interval Note:  10/12/2022 4:55 PM  Miguel Fletcher  has presented today for surgery, with the diagnosis of pad.  The various methods of treatment have been discussed with the patient and family. After consideration of risks, benefits and other options for treatment, the patient has consented to  Procedure(s): ABDOMINAL AORTOGRAM W/LOWER EXTREMITY (N/A) as a surgical intervention.  The patient's history has been reviewed, patient examined, no change in status, stable for surgery.  I have reviewed the patient's chart and labs.  Questions were answered to the patient's satisfaction.     Lorine Bears

## 2022-10-12 NOTE — Progress Notes (Signed)
Pt ambulated to and from bathroom with no signs of oozing from right groin site

## 2022-10-13 ENCOUNTER — Encounter (HOSPITAL_COMMUNITY): Payer: Self-pay | Admitting: Cardiovascular Disease

## 2022-10-17 ENCOUNTER — Telehealth: Payer: Self-pay

## 2022-10-17 DIAGNOSIS — Z599 Problem related to housing and economic circumstances, unspecified: Secondary | ICD-10-CM

## 2022-10-17 NOTE — Progress Notes (Signed)
CXR showed no active changes for fluid or pneumonia.

## 2022-10-17 NOTE — Telephone Encounter (Signed)
Spoke to pt who expressed he is unable to afford his medication. Nurse will place social worker consult

## 2022-10-18 ENCOUNTER — Telehealth: Payer: Self-pay | Admitting: Licensed Clinical Social Worker

## 2022-10-18 NOTE — Telephone Encounter (Signed)
H&V Care Navigation CSW Progress Note  Clinical Social Worker contacted patient by phone to f/u on referral for challenges with medication costs. Pt has Medicare, depending on income he may be able to apply for Medicare Savings Program/Extra Help. I left a voicemail requesting a call back will re-attempt as able.   Patient is participating in a Managed Medicaid Plan:  No, [employer] Medicare  SDOH Screenings   Food Insecurity: Patient Declined (12/22/2021)  Transportation Needs: Patient Declined (12/22/2021)  Utilities: Patient Declined (12/22/2021)  Tobacco Use: High Risk (10/04/2022)   Octavio Graves, MSW, LCSW Clinical Social Worker II [facility] Heart/Vascular Care Navigation  [phone]- work cell phone (preferred) [phone]- desk phone

## 2022-10-19 ENCOUNTER — Telehealth: Payer: Self-pay | Admitting: Licensed Clinical Social Worker

## 2022-10-19 NOTE — Telephone Encounter (Signed)
H&V Care Navigation CSW Progress Note  Clinical Social Worker contacted patient by phone to f/u on referral for challenges with medication costs. Pt has Medicare, depending on income he may be able to apply for Medicare Savings Program/Extra Help. I left a second voicemail this afternoon requesting a call back will re-attempt as able.    Patient is participating in a Managed Medicaid Plan:  No, [employer] Medicare   SDOH Screenings   Food Insecurity: Patient Declined (12/22/2021)  Transportation Needs: Patient Declined (12/22/2021)  Utilities: Patient Declined (12/22/2021)  Tobacco Use: High Risk (10/04/2022)    Octavio Graves, MSW, LCSW Clinical Social Worker II [facility] Heart/Vascular Care Navigation  [phone]- work cell phone (preferred) [phone]- desk phone

## 2022-10-21 ENCOUNTER — Telehealth (HOSPITAL_BASED_OUTPATIENT_CLINIC_OR_DEPARTMENT_OTHER): Payer: Self-pay | Admitting: Licensed Clinical Social Worker

## 2022-10-21 NOTE — Telephone Encounter (Signed)
H&V Care Navigation CSW Progress Note  Clinical Social Worker contacted patient by phone x3 (7/30, 7/31 and 8/2) to f/u on medication assistance. No answer any of the times I called, left three voicemails. No alternate numbers listed. Please see below for some assistance options. If pt calls [employer] they may be able to enroll him in medication assistance/additional medicare programs. Most of his medications are generics, he should also review costs at [facility].   NEED HELP WITH MEDICARE?  Trained [employer] volunteer counselors are available to meet with you at the senior center. Appointments are required. Call [phone]  to schedule a time. [employer] (Seniors' Health Insurance Information Program), is a division of the [place] Department of Insurance. [employer] provides free, objective information about Medicare including how and when to enroll and your options under Medicare.  Medicare Part D Extra Help: (2024 Guidelines) Available to all on Medicare with limited income and  resources. Will help lower premium, deductible, and  drug costs. Income Limit & Resources Individual $22,836/yr ($1,903/month) & $17,220 Couple $30,900/yr ($2,575/month) & $34,360  Medicare Savings Program: (2024 Guidelines) The state will pay your Medicare Part B premium; the  $174.70 will no longer be deducted from your SS check.  Includes Part D Extra Help. Income Limit Resources Individual $20,568/yr ($1,714/month) &  $10,930 Couple $27,828/yr ($2,319/month) & $17,130  Medicaid: (2024 Guidelines) Provides you with health insurance coverage, pays your  Medicare Part B premium ($174.70), and includes Part D  Extra Help. Income Limit & Resources Individual $15,300/yr ($1,275/month) & $2,000 Couple $20,688/yr ($1,724/month) & $3,000  Patient is participating in a Managed Medicaid Plan:  No, [employer] Medicare only  SDOH Screenings   Food Insecurity: Patient Declined (12/22/2021)  Transportation Needs:  Patient Declined (12/22/2021)  Utilities: Patient Declined (12/22/2021)  Tobacco Use: High Risk (10/04/2022)   Octavio Graves, MSW, LCSW Clinical Social Worker II [facility] Heart/Vascular Care Navigation  [phone]- work cell phone (preferred) [phone]- desk phone

## 2022-10-21 NOTE — Telephone Encounter (Signed)
H&V Care Navigation CSW Progress Note  Clinical Social Worker contacted patient by phone to f/u on referral for challenges with medication costs. Pt has Medicare, depending on income he may be able to apply for Medicare Savings Program/Extra Help. I left a third voicemail this afternoon requesting a call back. Unfortunately no alternate numbers available and no MyChart account set up. Remain available should pt engage with calls for care navigation assistance.  Will message options to RN team should they receive a call back as well.   Patient is participating in a Managed Medicaid Plan:  No, [employer] Medicare only  SDOH Screenings   Food Insecurity: Patient Declined (12/22/2021)  Transportation Needs: Patient Declined (12/22/2021)  Utilities: Patient Declined (12/22/2021)  Tobacco Use: High Risk (10/04/2022)     Octavio Graves, MSW, LCSW Clinical Social Worker II [facility] Heart/Vascular Care Navigation  [phone]- work cell phone (preferred) [phone]- desk phone

## 2022-10-26 ENCOUNTER — Ambulatory Visit: Payer: Medicare PPO | Admitting: Cardiology

## 2022-11-11 ENCOUNTER — Ambulatory Visit: Payer: Medicare PPO | Admitting: Physician Assistant

## 2022-11-18 ENCOUNTER — Encounter (INDEPENDENT_AMBULATORY_CARE_PROVIDER_SITE_OTHER): Payer: Medicare PPO

## 2022-11-18 ENCOUNTER — Encounter (INDEPENDENT_AMBULATORY_CARE_PROVIDER_SITE_OTHER): Payer: Medicare PPO | Admitting: Vascular Surgery

## 2022-11-22 NOTE — Progress Notes (Signed)
Cardiology Office Note    Date:  11/23/2022   ID:  Miguel Fletcher, DOB Oct 18, 1955, MRN [id number]  PCP:  Patient, No Pcp Per  Cardiologist:  Julien Nordmann, MD  Electrophysiologist:  None   Chief Complaint: Follow-up  History of Present Illness:   Miguel Fletcher is a 67 y.o. male with history of CAD status post 5-vessel CABG in 2022, PAD medically managed as outlined below, HTN, HLD, Bell's palsy, tobacco use, persistent hypokalemia, BPV, and history of poor compliance with medications who presents for follow-up of abdominal aortogram with lower extremity angiography.  He was admitted to the hospital in 09/2020 with NSTEMI.  LHC at that time showed severe multivessel CAD as outlined below.  Echo at that time showed an EF of 60 to 65% with grade 1 diastolic dysfunction and no regional wall motion abnormalities.  He underwent 5 vessel CABG in 10/2020 with postprocedure complicated by thoracentesis with subsequent acute heart failure in the setting of running out of medications.  Limited echo revealed an EF of 55%, severe LVH, severely dilated left atrium, and mitral regurgitation.  Echo in 12/2021 showed an EF of 60 to 65%, severe LVH, grade 1 diastolic dysfunction, mild mitral digitation, and aortic dilatation.  Lexiscan MPI in 12/2021 showed no significant ischemia and was overall low risk with an EF of 45% with recommendation to correlate with echo.  LAD and RCA calcifications were noted.  He was seen by general cardiology in 09/2022 after having been out of his medications for the prior 3 weeks.  He reported some chest discomfort that started after lifting a heavy beam at work without popping or stabbing with discomfort on top of a sternal wire.  He also reported some maxillary swelling.  Blood pressure was poorly controlled at 194/100 in the setting of medication noncompliance.  Subsequent chest x-ray showed no active cardiopulmonary disease.  He was evaluated by podiatry for foot discomfort  and was found to have critical limb ischemia and PAD.  Lower extremity arterial Doppler studies in 06/2019 showed an ABI of 0.84 on the right and 1.06 on the left.  Duplex showed borderline stenosis of the right SFA.  In this setting, he underwent abdominal aortogram with lower extremity angiography on 10/12/2022 which showed no significant aortoiliac disease.  There was mild left SFA disease with one-vessel runoff below the knee via a large dominant peroneal artery with reconstitution of the dorsalis pedis and plantar posterior tibial artery via collaterals from the communicating branches of the peroneal artery.  On the right, there was mild to moderate SFA disease with one-vessel runoff below the knee via the peroneal artery which had moderate proximal disease.  Reconstitution of the dorsalis pedis and the plantar posterior tibial via collaterals from the peroneal artery, though not as robust as on the left side.  There were no good long-term options for revascularization with the patient appearing to have reasonable flow via the peroneal artery.  Recommendation was made for smoking cessation and optimizing medical therapy.  He was started on cilostazol with recommendation to treat underlying severe hypokalemia which also may help with his numbness.  Attempted angioplasty of the bilateral tibial arteries was felt to be best reserved for refractory symptoms.  He comes in today and is without symptoms of angina or cardiac decompensation.  For progressive dyspnea orthopnea, PND, lower extremity swelling, or early satiety.  No palpitations, dizziness, presyncope, or syncope.  Reports he is adherent to medications though it is unclear what he  is taking exactly.  Continues to note lower extremity discomfort with ambulation as well as burning, no numbness, tingling, and pruritus along his feet at rest and at nighttime.  Continues to smoke a little over half a pack per day and indicates he will not quit.  Reports he has  "left longer than I thought I would."  Previously had a bet with old friends regarding who would live the longest.   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 08/2020 MRI [facility]): Well-circumscribed T1 hypointense, T2 hyperintense enhancing mass within the right axilla measuring 3.1 x 2.6 x 3.0 cm.  Mass extends along the course of the axillary neurovascular structures.  No evidence for invasion into the adjacent musculature.  No enlarged axillary lymph nodes.   Bell's palsy    Biliary colic    CAD (coronary artery disease)    a. 09/2020 NSTEMI/Cath: LM 60-70d, LAD 70p, D2 60, RI 70p, LCX ild diff dzs, RCA 40-56m, RPDA 50, RPAV 50; b. 10/2020 CABG x 5 (LIMA->LAD, L radial->RI, VG->D1, VG->RPDA->RPL.   Diastolic dysfunction    a. 09/2020 Echo: EF 60-65%, no rwma, sev LVH. Gr1 DD. Nl RV size/fxn. Mod dil LA.   Hemorrhoids    Hypercholesterolemia    Hypertension    Stroke (cerebrum) (HCC)    Tobacco use     Past Surgical History:  Procedure Laterality Date   ABDOMINAL AORTOGRAM W/LOWER EXTREMITY Bilateral 10/12/2022   Procedure: ABDOMINAL AORTOGRAM W/LOWER EXTREMITY;  Surgeon: Iran Ouch, MD;  Location: [facility] INVASIVE CV LAB;  Service: Cardiovascular;  Laterality: Bilateral;   CORONARY ARTERY BYPASS GRAFT N/A 10/28/2020   Procedure: CORONARY ARTERY BYPASS GRAFTING (CABG), ON PUMP, TIMES FIVE, USING LEFT INTERNAL MAMMARY ARTERY, LEFT RADIAL ARTERY AND ENDOSCOPICALLY HARVESTED RIGHT GREATER SAPHENOUS VEIN;  Surgeon: Loreli Slot, MD;  Location: [facility] OR;  Service: Open Heart Surgery;  Laterality: N/A;   CORONARY ULTRASOUND/IVUS N/A 10/06/2020   Procedure: Intravascular Ultrasound/IVUS;  Surgeon: Tonny Bollman, MD;  Location: [facility]  INVASIVE CV LAB;  Service: Cardiovascular;  Laterality: N/A;   ENDOVEIN HARVEST OF GREATER SAPHENOUS VEIN Right 10/28/2020   Procedure: ENDOVEIN HARVEST OF RIGHT GREATER SAPHENOUS VEIN;  Surgeon: Loreli Slot, MD;  Location: [facility] OR;  Service: Open Heart Surgery;  Laterality: Right;   IR THORACENTESIS ASP PLEURAL SPACE W/IMG GUIDE  11/12/2020   LEFT HEART CATH AND CORONARY ANGIOGRAPHY N/A 10/06/2020   Procedure: LEFT HEART CATH AND CORONARY ANGIOGRAPHY;  Surgeon: Tonny Bollman, MD;  Location: [facility] INVASIVE CV LAB;  Service: Cardiovascular;  Laterality: N/A;   RADIAL ARTERY HARVEST Left 10/28/2020   Procedure: LEFT RADIAL ARTERY HARVEST;  Surgeon: Loreli Slot, MD;  Location[facility];  Service: Open Heart Surgery;  Laterality: Left;   TEE WITHOUT CARDIOVERSION N/A 10/28/2020   Procedure: TRANSESOPHAGEAL ECHOCARDIOGRAM (TEE);  Surgeon: Loreli Slot, MD;  Location: [facility] OR;  Service: Open Heart Surgery;  Laterality: N/A;   VARICOSE VEIN SURGERY Left     Current Medications: Current Meds  Medication Sig   amLODipine (NORVASC) 10 MG tablet Take 1 tablet (10 mg total) by mouth daily.   aspirin EC 81 MG tablet Take 1 tablet (81 mg total) by mouth daily. Swallow whole.  atorvastatin (LIPITOR) 80 MG tablet Take 1 tablet (80 mg total) by mouth daily at 8 pm.   cilostazol (PLETAL) 50 MG tablet Take 1 tablet (50 mg total) by mouth 2 (two) times daily.   clotrimazole-betamethasone (LOTRISONE) cream Apply 1 Application topically daily.   lisinopril (ZESTRIL) 40 MG tablet Take 1 tablet (40 mg total) by mouth daily.   potassium chloride SA (KLOR-CON M) 20 MEQ tablet Take 1 tablet (20 mEq total) by mouth 2 (two) times daily.   spironolactone (ALDACTONE) 25 MG tablet Take 1 tablet (25 mg total) by mouth daily.   terbinafine (LAMISIL) 250 MG tablet Take 1 tablet (250 mg total) by mouth daily.    Allergies:   Patient has no known allergies.   Social History   Socioeconomic History    Marital status: Single    Spouse name: Not on file   Number of children: Not on file   Years of education: Not on file   Highest education level: Not on file  Occupational History   Not on file  Tobacco Use   Smoking status: Every Day    Current packs/day: 0.00    Average packs/day: 1 pack/day for 25.0 years (25.0 ttl pk-yrs)    Types: Cigarettes    Start date: 11/07/1995    Last attempt to quit: 11/06/2020    Years since quitting: 2.0   Smokeless tobacco: Never   Tobacco comments:    02/22/22 1 PPD  Vaping Use   Vaping status: Never Used  Substance and Sexual Activity   Alcohol use: No   Drug use: Not Currently    Comment: I QUIT ABOUT 20 YRS AGO   Sexual activity: Not on file  Other Topics Concern   Not on file  Social History Narrative   Not on file   Social Determinants of Health   Financial Resource Strain: Not on file  Food Insecurity: Patient Declined (12/22/2021)   Hunger Vital Sign    Worried About Running Out of Food in the Last Year: Patient declined    Barista in the Last Year: Patient declined  Transportation Needs: Patient Declined (12/22/2021)   PRAPARE - Administrator, Civil Service (Medical): Patient declined    Lack of Transportation (Non-Medical): Patient declined  Physical Activity: Not on file  Stress: Not on file  Social Connections: Not on file     Family History:  The patient's family history includes Alzheimer's disease in his father; Heart disease in his father. There is no history of CAD.  ROS:   12-point review of systems is negative unless otherwise noted in the HPI.   EKGs/Labs/Other Studies Reviewed:    Studies reviewed were summarized above. The additional studies were reviewed today:  Abdominal aortogram with lower extremity angiography 10/12/2022: 1.  No significant aortoiliac disease. 2.  Left lower extremity: Mild SFA disease with one-vessel runoff below the knee via a large dominant peroneal artery with  reconstitution of the dorsalis pedis and the plantar posterior tibial artery via collaterals from the communicating branches of the peroneal artery. 3.  Right lower extremity: Mild to moderate SFA disease with one-vessel runoff below the knee via the peroneal artery which has moderate proximal disease.  Reconstitution of the dorsalis pedis and the plantar posterior tibial via collaterals from the peroneal artery but not as robust as on the left side.   Recommendations: No good long-term options for revascularization and the patient appears to have reasonable flow via the peroneal  artery.  Recommend smoking cessation and optimizing medical therapy. I added cilostazol.  Hopefully, treatment of severe hypokalemia might also help with his numbness.  if cilostazol is not tolerated, low-dose Xarelto should be considered. Attempted angioplasty of the bilateral tibial arteries should be reserved for refractory symptoms. __________  Eugenie Birks MPI 12/22/2021:   Findings are consistent with no ischemia. The study is low risk.   LV perfusion is normal.   Left ventricular function is normal visually. calculated EF 45%, correlation with echo advised   LAD and RCA calcifications noted   Study quality degraded by diaphragmatic attenuations __________  2D echo 12/21/2021: 1. Left ventricular ejection fraction, by estimation, is 60 to 65%. The  left ventricle has normal function. The left ventricle has no regional  wall motion abnormalities. There is severe left ventricular hypertrophy.  Left ventricular diastolic parameters   are consistent with Grade I diastolic dysfunction (impaired relaxation).  Elevated left atrial pressure.   2. Right ventricular systolic function is low normal. The right  ventricular size is mildly enlarged.   3. Left atrial size was mildly dilated.   4. The mitral valve is degenerative. Mild mitral valve regurgitation.   5. The aortic valve is tricuspid. Aortic valve regurgitation is  not  visualized. No aortic stenosis is present.   6. Aortic dilatation noted. There is borderline dilatation of the aortic  root, measuring 38 mm.   7. The inferior vena cava is normal in size with <50% respiratory  variability, suggesting right atrial pressure of 8 mmHg.  __________  Limited echo 11/12/2020: 1. Left ventricular ejection fraction, by estimation, is 55%. The left  ventricle has normal function. The left ventricle demonstrates regional  wall motion abnormalities (septal wall motion consistent with  postoperative state). There is severe left  ventricular hypertrophy.   2. Right ventricular systolic function is normal. The right ventricular  size is normal. There is normal pulmonary artery systolic pressure. The  estimated right ventricular systolic pressure is 28.8 mmHg.   3. Left atrial size was severely dilated.   4. The mitral valve is normal in structure. Mild mitral valve  regurgitation. No evidence of mitral stenosis.   5. Large left pleural effusion estimated >7 cm, up to 9 cm in select  images  __________  Intraoperative TEE 10/28/2020: POST-OP IMPRESSIONS  _ Left Ventricle: The left ventricle is unchanged from pre-bypass.  _ Right Ventricle: The right ventricle appears unchanged from pre-bypass.  _ Aorta: The aorta appears unchanged from pre-bypass.  _ Left Atrium: The left atrium appears unchanged from pre-bypass.  _ Left Atrial Appendage: The left atrial appendage appears unchanged from  pre-bypass.  _ Aortic Valve: The aortic valve appears unchanged from pre-bypass.  _ Mitral Valve: The mitral valve appears unchanged from pre-bypass.  _ Tricuspid Valve: The tricuspid valve appears unchanged from pre-bypass.  _ Pulmonic Valve: The pulmonic valve appears unchanged from pre-bypass.  _ Interatrial Septum: The interatrial septum appears unchanged from  pre-bypass.  _ Interventricular Septum: The interventricular septum appears unchanged  from  pre-bypass.   _ Pericardium: The pericardium appears unchanged from pre-bypass.  _ Comments: Post-bypass images reviewed with surgeon.  __________  Pre-CABG vascular imaging 10/26/2020: Summary:  Right Carotid: Velocities in the right ICA are consistent with a 1-39%  stenosis.   Left Carotid: Velocities in the left ICA are consistent with a 1-39%  stenosis.  Vertebrals: Bilateral vertebral arteries demonstrate antegrade flow.   Right ABI: Resting right ankle-brachial index indicates moderate right  lower  extremity arterial disease.  Left ABI: Resting left ankle-brachial index indicates moderate left lower  extremity arterial disease.  Right Upper Extremity: Doppler waveform obliterate with right radial  compression. Doppler waveforms remain within normal limits with right  ulnar compression.  Left Upper Extremity: Doppler waveform obliterate with left radial  compression. Doppler waveforms remain within normal limits with left ulnar  compression.  __________  LHC 10/06/2020:   Dist LM to Prox LAD lesion is 70% stenosed.   Ramus lesion is 70% stenosed.   2nd Diag lesion is 60% stenosed.   Mid RCA lesion is 40% stenosed.   RPDA lesion is 50% stenosed.   RPAV lesion is 50% stenosed.   1.  Moderately severe, hemodynamically significant distal left main disease extending into the proximal LAD, confirmed by intravascular ultrasound assessment with minimal lumen area approximately 4 mm. 2.  Moderately severe eccentric stenosis of the ramus intermedius branch 3.  Patent left circumflex with mild nonobstructive disease 4.  Patent RCA with mild to moderate mid vessel stenosis and moderate stenosis involving the PDA and PLA branch vessels. 5.  Systemic hypertension   The patient has surgical coronary anatomy.  His coronary disease is clearly significant but not critical and it would be reasonable to treat him with medical therapy, control his blood pressure, allow him to recover from COVID, and arrange  outpatient cardiac surgical consultation for consideration of CABG. __________  2D echo 10/02/2020: 1. Left ventricular ejection fraction, by estimation, is 60 to 65%. The  left ventricle has normal function. The left ventricle has no regional  wall motion abnormalities. There is severe left ventricular hypertrophy.  Left ventricular diastolic parameters   are consistent with Grade I diastolic dysfunction (impaired relaxation).   2. Right ventricular systolic function is normal. The right ventricular  size is normal.   3. Left atrial size was moderately dilated.  ___________  2D echo 03/20/2016: - Left ventricle: The cavity size was normal. There was severe    concentric hypertrophy. Systolic function was normal. The    estimated ejection fraction was 60%. Wall motion was normal;    there were no regional wall motion abnormalities.  - Mitral valve: Calcified annulus. There was mild regurgitation.  - Left atrium: The atrium was mildly dilated.  - Atrial septum: No defect or patent foramen ovale was identified.   Impressions:   - Severe left ventricular hypertrophy with normal wall motion and    normal ejection fraction and mild diastolic dysfunction.   EKG:  EKG is not ordered today.    Recent Labs: 12/21/2021: ALT 12; Magnesium 1.9; Magnesium 2.0 10/07/2022: Platelets 210 10/12/2022: BUN 10; Creatinine, Ser 0.90; Hemoglobin 13.3; Potassium 3.1; Sodium 143  Recent Lipid Panel    Component Value Date/Time   CHOL 72 (L) 11/09/2020 1429   TRIG 87 11/09/2020 1429   HDL 21 (L) 11/09/2020 1429   CHOLHDL 3.4 11/09/2020 1429   CHOLHDL 3.8 10/06/2020 0041   VLDL 9 10/06/2020 0041   LDLCALC 33 11/09/2020 1429   LDLDIRECT 30 11/09/2020 1429    PHYSICAL EXAM:    VS:  BP (!) 168/90 (BP Location: Left Arm, Patient Position: Sitting, Cuff Size: Normal)   Pulse (!) 54   Ht 5\' 9"  (1.753 m)   Wt 196 lb 9.6 oz (89.2 kg)   SpO2 94%   BMI 29.03 kg/m   BMI: Body mass index is 29.03  kg/m.  Physical Exam Vitals reviewed.  Constitutional:      Appearance: He is  well-developed.  HENT:     Head: Normocephalic and atraumatic.  Eyes:     General:        Right eye: No discharge.        Left eye: No discharge.  Neck:     Vascular: No JVD.  Cardiovascular:     Rate and Rhythm: Normal rate and regular rhythm.     Heart sounds: Normal heart sounds, S1 normal and S2 normal. Heart sounds not distant. No midsystolic click and no opening snap. No murmur heard.    No friction rub.  Pulmonary:     Effort: Pulmonary effort is normal. No respiratory distress.     Breath sounds: Normal breath sounds. No decreased breath sounds, wheezing, rhonchi or rales.     Comments: Coarse breath sounds bilaterally. Chest:     Chest wall: No tenderness.  Abdominal:     General: There is no distension.  Musculoskeletal:     Cervical back: Normal range of motion.     Right lower leg: No edema.     Left lower leg: No edema.  Skin:    General: Skin is warm and dry.     Nails: There is no clubbing.  Neurological:     Mental Status: He is alert and oriented to person, place, and time.  Psychiatric:        Speech: Speech normal.        Behavior: Behavior normal.        Thought Content: Thought content normal.        Judgment: Judgment normal.     Wt Readings from Last 3 Encounters:  11/23/22 196 lb 9.6 oz (89.2 kg)  10/12/22 200 lb (90.7 kg)  10/04/22 201 lb 2 oz (91.2 kg)     ASSESSMENT & PLAN:   CAD status post CABG without angina: He is without symptoms of angina or cardiac decompensation.  Continue aggressive risk factor modification and secondary prevention including aspirin, atorvastatin, amlodipine, and lisinopril.  No indication for further ischemic testing at this time.  PAD: Continues to note both symptoms consistent with claudication and atypical symptoms concerning for possible neuropathy.  Recent lower extremity angiography showed no significant aortoiliac disease  medical management as outlined above given no good long-term options for revascularization and notable reasonable flow via the peroneal artery.  Attempted angioplasty of the bilateral tibial arteries should be reserved for refractory symptoms.  Continue Pletal.  If not tolerated, could use low-dose Xarelto instead.  Recommend complete smoking cessation, patient indicates he will not quit smoking.  Recommend regular walking regimen.  HFpEF: Appears euvolemic and well compensated.  Not requiring a standing loop diuretic.  Continue spironolactone.  Defer addition of SGLT2 inhibitor out of concern for follow-up.  HTN: Blood pressure is elevated in the office today, though he has not yet taken his antihypertensives.  In this setting, we deferred escalation of medical therapy.  Continue amlodipine 10 mg daily, lisinopril 40 mg daily, and spironolactone 25 mg daily.  Check BMP as outlined below.  HLD: LDL 93 in 10/2021 with target LDL less than 55.  Remains on atorvastatin 80 mg daily.  Obtain fasting lipid panel at next visit.  Tobacco use: Continues to smoke and indicates he will not quit.  Complete cessation is recommended.  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  It is unclear if this is largely driven by diet at this time.  May benefit from nephrology referral.  Remains on KCl repletion and spironolactone with lisinopril.  Add on magnesium with recommendation to replete to goal 2.0 as indicated.    Disposition: F/u with Dr. Kirke Corin for PAD follow up.   Medication Adjustments/Labs and Tests Ordered: Current medicines are reviewed at length with the patient today.  Concerns regarding medicines are outlined above. Medication changes, Labs and Tests ordered today are summarized above and listed in the Patient Instructions accessible in Encounters.   Signed, Eula Listen, PA-C 11/23/2022 12:54 PM     [facility] -  [place] [address] [place], [place] [address] [phone]

## 2022-11-23 ENCOUNTER — Ambulatory Visit: Payer: Medicare HMO | Attending: Physician Assistant | Admitting: Physician Assistant

## 2022-11-23 ENCOUNTER — Encounter: Payer: Self-pay | Admitting: Physician Assistant

## 2022-11-23 VITALS — BP 168/90 | HR 54 | Ht 69.0 in | Wt 196.6 lb

## 2022-11-23 DIAGNOSIS — Z951 Presence of aortocoronary bypass graft: Secondary | ICD-10-CM | POA: Diagnosis not present

## 2022-11-23 DIAGNOSIS — I2510 Atherosclerotic heart disease of native coronary artery without angina pectoris: Secondary | ICD-10-CM

## 2022-11-23 DIAGNOSIS — I5032 Chronic diastolic (congestive) heart failure: Secondary | ICD-10-CM | POA: Diagnosis not present

## 2022-11-23 DIAGNOSIS — I10 Essential (primary) hypertension: Secondary | ICD-10-CM

## 2022-11-23 DIAGNOSIS — E876 Hypokalemia: Secondary | ICD-10-CM

## 2022-11-23 DIAGNOSIS — I739 Peripheral vascular disease, unspecified: Secondary | ICD-10-CM

## 2022-11-23 DIAGNOSIS — E785 Hyperlipidemia, unspecified: Secondary | ICD-10-CM

## 2022-11-23 DIAGNOSIS — Z720 Tobacco use: Secondary | ICD-10-CM

## 2022-11-23 NOTE — Patient Instructions (Signed)
Medication Instructions:  Your Physician recommend you continue on your current medication as directed.    *If you need a refill on your cardiac medications before your next appointment, please call your pharmacy*   Lab Work: Your provider would like for you to have following labs drawn today BMET and Magnesium Level.   If you have labs (blood work) drawn today and your tests are completely normal, you will receive your results only by: MyChart Message (if you have MyChart) OR A paper copy in the mail If you have any lab test that is abnormal or we need to change your treatment, we will call you to review the results.   Testing/Procedures: None   Follow-Up: At [facility], you and your health needs are our priority.  As part of our continuing mission to provide you with exceptional heart care, we have created designated Provider Care Teams.  These Care Teams include your primary Cardiologist (physician) and Advanced Practice Providers (APPs -  Physician Assistants and Nurse Practitioners) who all work together to provide you with the care you need, when you need it.  We recommend signing up for the patient portal called "MyChart".  Sign up information is provided on this After Visit Summary.  MyChart is used to connect with patients for Virtual Visits (Telemedicine).  Patients are able to view lab/test results, encounter notes, upcoming appointments, etc.  Non-urgent messages can be sent to your provider as well.   To learn more about what you can do with MyChart, go to [email or web address].    Your next appointment:   Next available  Provider:   Lorine Bears, MD

## 2022-11-24 LAB — BASIC METABOLIC PANEL
BUN/Creatinine Ratio: 11 (ref 10–24)
BUN: 12 mg/dL (ref 8–27)
CO2: 26 mmol/L (ref 20–29)
Calcium: 9.1 mg/dL (ref 8.6–10.2)
Chloride: 102 mmol/L (ref 96–106)
Creatinine, Ser: 1.06 mg/dL (ref 0.76–1.27)
Glucose: 72 mg/dL (ref 70–99)
Potassium: 3.7 mmol/L (ref 3.5–5.2)
Sodium: 141 mmol/L (ref 134–144)
eGFR: 77 mL/min/{1.73_m2} (ref >59)

## 2022-11-24 LAB — MAGNESIUM: Magnesium: 2.1 mg/dL (ref 1.6–2.3)

## 2023-01-27 ENCOUNTER — Ambulatory Visit: Payer: Medicare HMO | Admitting: Cardiovascular Disease

## 2023-02-02 ENCOUNTER — Encounter: Payer: Self-pay | Admitting: Cardiovascular Disease

## 2023-02-02 ENCOUNTER — Ambulatory Visit: Payer: Medicare HMO | Attending: Cardiovascular Disease | Admitting: Cardiovascular Disease

## 2023-03-24 ENCOUNTER — Emergency Department
Admission: EM | Admit: 2023-03-24 | Discharge: 2023-03-24 | Disposition: A | Discharge status: Home / Self Care | Payer: Medicare HMO | Attending: Emergency Medicine | Admitting: Emergency Medicine

## 2023-03-24 ENCOUNTER — Emergency Department: Payer: Medicare HMO

## 2023-03-24 ENCOUNTER — Other Ambulatory Visit: Payer: Self-pay

## 2023-03-24 DIAGNOSIS — Z951 Presence of aortocoronary bypass graft: Secondary | ICD-10-CM | POA: Insufficient documentation

## 2023-03-24 DIAGNOSIS — R0789 Other chest pain: Secondary | ICD-10-CM | POA: Diagnosis present

## 2023-03-24 DIAGNOSIS — J449 Chronic obstructive pulmonary disease, unspecified: Secondary | ICD-10-CM | POA: Diagnosis not present

## 2023-03-24 DIAGNOSIS — I10 Essential (primary) hypertension: Secondary | ICD-10-CM | POA: Insufficient documentation

## 2023-03-24 LAB — BASIC METABOLIC PANEL
Anion gap: 10 (ref 5–15)
BUN: 18 mg/dL (ref 8–23)
CO2: 30 mmol/L (ref 22–32)
Calcium: 9.2 mg/dL (ref 8.9–10.3)
Chloride: 99 mmol/L (ref 98–111)
Creatinine, Ser: 1.18 mg/dL (ref 0.61–1.24)
GFR, Estimated: >60 mL/min (ref >60)
Glucose, Bld: 89 mg/dL (ref 70–99)
Potassium: 3 mmol/L — ABNORMAL LOW (ref 3.5–5.1)
Sodium: 139 mmol/L (ref 135–145)

## 2023-03-24 LAB — URINALYSIS, ROUTINE W REFLEX MICROSCOPIC
Bilirubin Urine: NEGATIVE
Glucose, UA: NEGATIVE mg/dL
Hgb urine dipstick: NEGATIVE
Ketones, ur: NEGATIVE mg/dL
Leukocytes,Ua: NEGATIVE
Nitrite: NEGATIVE
Protein, ur: NEGATIVE mg/dL
Specific Gravity, Urine: 1.011 (ref 1.005–1.030)
pH: 6 (ref 5.0–8.0)

## 2023-03-24 LAB — CBC
HCT: 39.6 % (ref 39.0–52.0)
Hemoglobin: 13.2 g/dL (ref 13.0–17.0)
MCH: 31.1 pg (ref 26.0–34.0)
MCHC: 33.3 g/dL (ref 30.0–36.0)
MCV: 93.4 fL (ref 80.0–100.0)
Platelets: 171 10*3/uL (ref 150–400)
RBC: 4.24 MIL/uL (ref 4.22–5.81)
RDW: 13.6 % (ref 11.5–15.5)
WBC: 5.9 10*3/uL (ref 4.0–10.5)
nRBC: 0 % (ref 0.0–0.2)

## 2023-03-24 LAB — TROPONIN I (HIGH SENSITIVITY)
Troponin I (High Sensitivity): 91 ng/L — ABNORMAL HIGH (ref <18)
Troponin I (High Sensitivity): 93 ng/L — ABNORMAL HIGH (ref <18)

## 2023-03-24 MED ORDER — HYDRALAZINE HCL 50 MG PO TABS
50.0000 mg | ORAL_TABLET | Freq: Once | ORAL | Status: AC
  Administered 2023-03-24: 50 mg via ORAL
  Filled 2023-03-24: qty 1

## 2023-03-24 MED ORDER — MELOXICAM 15 MG PO TABS: 15.0000 mg | ORAL_TABLET | Freq: Every day | ORAL | 0 refills | Status: AC

## 2023-03-24 MED ORDER — HYDRALAZINE HCL 20 MG/ML IJ SOLN
20.0000 mg | Freq: Once | INTRAMUSCULAR | Status: AC
  Administered 2023-03-24: 20 mg via INTRAVENOUS
  Filled 2023-03-24: qty 1

## 2023-03-24 MED ORDER — CYCLOBENZAPRINE HCL 10 MG PO TABS
10.0000 mg | ORAL_TABLET | Freq: Once | ORAL | Status: AC
  Administered 2023-03-24: 10 mg via ORAL
  Filled 2023-03-24: qty 1

## 2023-03-24 MED ORDER — IOHEXOL 350 MG/ML SOLN
75.0000 mL | Freq: Once | INTRAVENOUS | Status: AC | PRN
  Administered 2023-03-24: 75 mL via INTRAVENOUS

## 2023-03-24 MED ORDER — MORPHINE SULFATE (PF) 4 MG/ML IV SOLN
4.0000 mg | Freq: Once | INTRAVENOUS | Status: AC
  Administered 2023-03-24: 4 mg via INTRAVENOUS
  Filled 2023-03-24: qty 1

## 2023-03-24 MED ORDER — MELOXICAM 7.5 MG PO TABS
15.0000 mg | ORAL_TABLET | Freq: Once | ORAL | Status: AC
  Administered 2023-03-24: 15 mg via ORAL
  Filled 2023-03-24: qty 2

## 2023-03-24 MED ORDER — CYCLOBENZAPRINE HCL 5 MG PO TABS: 5.0000 mg | ORAL_TABLET | Freq: Three times a day (TID) | ORAL | 0 refills | Status: AC | PRN

## 2023-03-24 MED ORDER — HYDROCODONE-ACETAMINOPHEN 5-325 MG PO TABS: 1.0000 | ORAL_TABLET | ORAL | 0 refills | Status: DC | PRN

## 2023-03-24 MED ORDER — NITROGLYCERIN IN D5W 200-5 MCG/ML-% IV SOLN: 0.0000 ug/min | INTRAVENOUS | Status: DC

## 2023-03-24 NOTE — ED Triage Notes (Signed)
 Pt here via [facility] with cp. Hx of MI and CABG. Pt having left side cp x1 week. Pt states cp woke him up at 0400 this morning. Pt states pain has been consistent since it started and he endorses some nausea. Pt also c/o burning with urination and some occasional rectal bleeding.   242/112 66 96% RA 134-cbg 18G LAC 324 mg at 0635 0.4 mg nitroglycerin  at 0642 1 inch nitroglycerin  paste at 0649   206/119 after nitroglycerin  paste

## 2023-03-24 NOTE — ED Provider Notes (Signed)
 [facility] Provider Note   Event Date/Time   First MD Initiated Contact with Patient 03/24/23 [phone]     (approximate) History  Chest Pain  HPI Miguel Fletcher is a 68 y.o. 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  Patient describes a 10/10 pain that is worsened with palpation and denies any relieving factors.  Patient has not tried any medications for this pain.  Patient denies any associated shortness of breath ROS: Patient currently denies any vision changes, tinnitus, difficulty speaking, facial droop, sore throat, shortness of breath, abdominal pain, nausea/vomiting/diarrhea, dysuria, or weakness/numbness/paresthesias in any extremity   Physical Exam  Triage Vital Signs: ED Triage Vitals  Encounter Vitals Group     BP 03/24/23 0719 (!) 208/106     Systolic BP Percentile --      Diastolic BP Percentile --      Pulse Rate 03/24/23 0719 66     Resp 03/24/23 0719 18     Temp 03/24/23 0719 98.4 F (36.9 C)     Temp Source 03/24/23 0719 Oral     SpO2 03/24/23 0719 97 %     Weight 03/24/23 0720 196 lb 10.4 oz (89.2 kg)     Height 03/24/23 0720 5' 9 (1.753 m)     Head Circumference --      Peak Flow --      Pain Score 03/24/23 0720 10     Pain Loc --      Pain Education --      Exclude from Growth Chart --    Most recent vital signs: Vitals:   03/24/23 0955 03/24/23 1000  BP: (!) 149/68 (!) 153/60  Pulse: 78 73  Resp: 13   Temp:    SpO2: 100% 100%   General: Awake, oriented x4. CV:  Good peripheral perfusion.  Resp:  Normal effort.  Abd:  No distention.  Other:  Elderly the overweight Caucasian male resting comfortably in no acute distress.  Tenderness to palpation over left anterior lateral lower chest wall overlying approximately  11th and 12th ribs ED Results / Procedures / Treatments  Labs (all labs ordered are listed, but only abnormal results are displayed) Labs Reviewed  BASIC METABOLIC PANEL - Abnormal; Notable for the following components:      Result Value   Potassium 3.0 (*)    All other components within normal limits  URINALYSIS, ROUTINE W REFLEX MICROSCOPIC - Abnormal; Notable for the following components:   Color, Urine STRAW (*)    APPearance CLEAR (*)    All other components within normal limits  TROPONIN I (HIGH SENSITIVITY) - Abnormal; Notable for the following components:   Troponin I (High Sensitivity) 91 (*)    All other components within normal limits  TROPONIN I (HIGH SENSITIVITY) - Abnormal; Notable for the following components:   Troponin I (High Sensitivity) 93 (*)    All other components within normal limits  CBC   EKG ED ECG REPORT I, Artist MARLA Kerns, the attending physician, personally viewed and interpreted this ECG. Date: 03/24/2023 EKG Time: 0721 Rate: 64 Rhythm: normal sinus rhythm QRS Axis: normal Intervals: normal ST/T Wave abnormalities: normal Narrative Interpretation: no evidence of acute ischemia RADIOLOGY ED MD interpretation: 2 view chest x-ray interpreted by me shows no evidence of acute abnormalities  including no pneumonia, pneumothorax, or widened mediastinum.  Incidentally found chronic cardiomegaly with trace pleural effusion  CT angiography of the chest independently interpreted and shows no acute embolism to the paroxysmal subsegmental pulmonary artery level.  There is a small right pleural effusion with associated patchy atelectatic changes at the right lung base.  There is mild cardiomegaly.  There is no pericardial effusion. -Agree with radiology assessment Official radiology report(s): CT Angio Chest PE W/Cm &/Or Wo Cm Result Date: 03/24/2023 CLINICAL DATA:  Pulmonary embolism (PE) suspected, high prob Pulmonary embolism (PE) suspected, high prob.  Left-sided chest pain for 1 week. EXAM: CT ANGIOGRAPHY CHEST WITH CONTRAST TECHNIQUE: Multidetector CT imaging of the chest was performed using the standard protocol during bolus administration of intravenous contrast. Multiplanar CT image reconstructions and MIPs were obtained to evaluate the vascular anatomy. RADIATION DOSE REDUCTION: This exam was performed according to the departmental dose-optimization program which includes automated exposure control, adjustment of the mA and/or kV according to patient size and/or use of iterative reconstruction technique. CONTRAST:  75mL OMNIPAQUE  IOHEXOL  350 MG/ML SOLN COMPARISON:  CT angiography chest from 10/01/2020. FINDINGS: Cardiovascular: No evidence of embolism to the proximal subsegmental pulmonary artery level. Moderate cardiomegaly. No pericardial effusion. No aortic aneurysm. Patient is status post CABG. Mediastinum/Nodes: Visualized thyroid gland appears grossly unremarkable. No solid / cystic mediastinal masses. The esophagus is nondistended precluding optimal assessment. There are few mildly prominent mediastinal and hilar lymph nodes, which do not meet the size criteria for lymphadenopathy and appear grossly similar to the prior study, favoring benign etiology. No axillary lymphadenopathy by size criteria. Lungs/Pleura: The central tracheo-bronchial tree is patent. There is small right pleural effusion with associated patchy atelectatic changes at the right lung base. No left pleural effusion. No mass, consolidation or pneumothorax. No suspicious lung nodules. Upper Abdomen: Visualized upper abdominal viscera within normal limits. Musculoskeletal: The visualized soft tissues of the chest wall are grossly unremarkable. No suspicious osseous lesions. There are moderate multilevel degenerative changes in the visualized spine. Review of the MIP images confirms the above findings. IMPRESSION: 1. No acute embolism to the proximal subsegmental pulmonary artery level.  2. There is small right pleural effusion with associated patchy atelectatic changes at the right lung base. 3. Moderate cardiomegaly.  No pericardial effusion. 4. Multiple other nonacute observations, as described above. Electronically Signed   By: Ree Molt M.D.   On: 03/24/2023 09:42   DG Chest 2 View Result Date: 03/24/2023 CLINICAL DATA:  Chest pain EXAM: CHEST - 2 VIEW COMPARISON:  10/07/2022 FINDINGS: Cardiopericardial enlargement with left ventricular hypertrophy by prior CT. CABG. Mild blunting at the costophrenic sulci. No pulmonary edema or consolidation. No pneumothorax. IMPRESSION: Chronic cardiomegaly with trace pleural effusion. Electronically Signed   By: Dorn Roulette M.D.   On: 03/24/2023 07:57   PROCEDURES: Critical Care performed: No .1-3 Lead EKG Interpretation  Performed by: Jossie Artist POUR, MD Authorized by: Jossie Artist POUR, MD     Interpretation: normal     ECG rate:  71   ECG rate assessment: normal     Rhythm: sinus rhythm     Ectopy: none     Conduction: normal    MEDICATIONS ORDERED IN ED: Medications  meloxicam  (MOBIC ) tablet 15 mg (15 mg Oral Given 03/24/23 0849)  cyclobenzaprine  (FLEXERIL ) tablet 10 mg (10 mg Oral Given 03/24/23 0849)  hydrALAZINE  (APRESOLINE ) tablet 50 mg (50 mg Oral Given 03/24/23 0849)  iohexol  (OMNIPAQUE ) 350 MG/ML injection 75 mL (75 mLs Intravenous Contrast Given  03/24/23 9161)  hydrALAZINE  (APRESOLINE ) injection 20 mg (20 mg Intravenous Given 03/24/23 0931)  morphine  (PF) 4 MG/ML injection 4 mg (4 mg Intravenous Given 03/24/23 0953)   IMPRESSION / MDM / ASSESSMENT AND PLAN / ED COURSE  I reviewed the triage vital signs and the nursing notes.                             The patient is on the cardiac monitor to evaluate for evidence of arrhythmia and/or significant heart rate changes. Patient's presentation is most consistent with acute presentation with potential threat to life or bodily function. This patient presents with atypical  chest pain, most likely secondary to musculoskeletal injury. Differential diagnosis includes rib fracture, costochondritis, sternal fracture. Low suspicion for ACS, acute PE (PERC negative), pericarditis / myocarditis, thoracic aortic dissection, pneumothorax, pneumonia or other acute infectious process. Presentation not consistent with other acute, emergent causes of chest pain at this time. No indication for cardiac enzyme testing. Plan to order CXR to evaluate for acute cardiopulmonary causes.  Plan: EKG, CXR, CTA, pain control Patient's troponin elevated however is not significantly uptrending. Dispo: Discharge home with home care   FINAL CLINICAL IMPRESSION(S) / ED DIAGNOSES   Final diagnoses:  Chest wall pain   Rx / DC Orders   ED Discharge Orders          Ordered    cyclobenzaprine  (FLEXERIL ) 5 MG tablet  3 times daily PRN        03/24/23 1013    meloxicam  (MOBIC ) 15 MG tablet  Daily        03/24/23 1013    HYDROcodone -acetaminophen  (NORCO) 5-325 MG tablet  Every 4 hours PRN        03/24/23 1013           Note:  This document was prepared using Dragon voice recognition software and may include unintentional dictation errors.   Vernell Back K, MD 03/24/23 1019

## 2023-03-24 NOTE — ED Notes (Signed)
Blue top sent to lab if needed.

## 2023-04-12 ENCOUNTER — Ambulatory Visit: Payer: Medicare HMO | Attending: Nurse Practitioner | Admitting: Nurse Practitioner

## 2023-04-12 ENCOUNTER — Encounter: Payer: Self-pay | Admitting: Nurse Practitioner

## 2023-04-12 VITALS — BP 222/110 | HR 67 | Ht 68.0 in | Wt 204.2 lb

## 2023-04-12 DIAGNOSIS — I25118 Atherosclerotic heart disease of native coronary artery with other forms of angina pectoris: Secondary | ICD-10-CM | POA: Diagnosis not present

## 2023-04-12 DIAGNOSIS — E785 Hyperlipidemia, unspecified: Secondary | ICD-10-CM

## 2023-04-12 DIAGNOSIS — I739 Peripheral vascular disease, unspecified: Secondary | ICD-10-CM | POA: Diagnosis not present

## 2023-04-12 DIAGNOSIS — E876 Hypokalemia: Secondary | ICD-10-CM

## 2023-04-12 DIAGNOSIS — Z720 Tobacco use: Secondary | ICD-10-CM

## 2023-04-12 DIAGNOSIS — I10 Essential (primary) hypertension: Secondary | ICD-10-CM | POA: Diagnosis not present

## 2023-04-12 MED ORDER — CILOSTAZOL 50 MG PO TABS: 50.0000 mg | ORAL_TABLET | Freq: Two times a day (BID) | ORAL | 1 refills | Status: DC

## 2023-04-12 MED ORDER — SPIRONOLACTONE 25 MG PO TABS: 25.0000 mg | ORAL_TABLET | Freq: Every day | ORAL | 1 refills | Status: DC

## 2023-04-12 NOTE — Patient Instructions (Addendum)
Medication Instructions:  No changes *If you need a refill on your cardiac medications before your next appointment, please call your pharmacy*   Lab Work: Your provider would like for you to have the following labs today: BMET  If you have labs (blood work) drawn today and your tests are completely normal, you will receive your results only by: MyChart Message (if you have MyChart) OR A paper copy in the mail If you have any lab test that is abnormal or we need to change your treatment, we will call you to review the results.   Testing/Procedures: CARDIAC PET- Your physician has requested that you have a Cardiac Pet Stress Test.   This testing is completed at [facility] ([address] [place], [place] [address]) or [facility] ([address], [place], [place]). Please arrive 30 minutes prior to your scheduled time.  The schedulers will call you to get this scheduled. Please follow further testing instructions below.    Follow-Up: At [facility], you and your health needs are our priority.  As part of our continuing mission to provide you with exceptional heart care, we have created designated Provider Care Teams.  These Care Teams include your primary Cardiologist (physician) and Advanced Practice Providers (APPs -  Physician Assistants and Nurse Practitioners) who all work together to provide you with the care you need, when you need it.  We recommend signing up for the patient portal called "MyChart".  Sign up information is provided on this After Visit Summary.  MyChart is used to connect with patients for Virtual Visits (Telemedicine).  Patients are able to view lab/test results, encounter notes, upcoming appointments, etc.  Non-urgent messages can be sent to your provider as well.   To learn more about what you can do with MyChart, go to [email or web address].    Your next appointment:   2-3 week(s)  Provider:    You may see Julien Nordmann, MD or one of the following Advanced Practice Providers on your designated Care Team:   Nicolasa Ducking, NP        Please report to Radiology at the [facility] Main Entrance 30 minutes early for your test.  [address] [place], [place] [address]                         OR   Please report to Radiology at [facility] Main Entrance, medical mall, 30 mins prior to your test.  [address]  [place], [place]  How to Prepare for Your Cardiac PET/CT Stress Test:  Nothing to eat or drink, except water, 3 hours prior to arrival time.  NO caffeine/decaffeinated products, or chocolate 12 hours prior to arrival. (Please note decaffeinated beverages (teas/coffees) still contain caffeine).  If you have caffeine within 12 hours prior, the test will need to be rescheduled.  Medication instructions: Do not take erectile dysfunction medications for 72 hours prior to test (sildenafil, tadalafil) Hold the spironolactone the morning of the test  You may take your remaining medications with water.   Total time is 1 to 2 hours; you may want to bring reading material for the waiting time.  IF YOU THINK YOU MAY BE PREGNANT, OR ARE NURSING PLEASE INFORM THE TECHNOLOGIST.  In preparation for your appointment, medication and supplies will be purchased.  Appointment availability is limited, so if you need to cancel or reschedule, please call the Radiology Department  Scheduler at [phone] 24 hours in advance to avoid a cancellation fee of $100.00  What to Expect When you Arrive:  Once you arrive and check in for your appointment, you will be taken to a preparation room within the Radiology Department.  A technologist or Nurse will obtain your medical history, verify that you are correctly prepped for the exam, and explain the procedure.  Afterwards, an IV will be started in your arm and electrodes will be placed on your skin for EKG  monitoring during the stress portion of the exam. Then you will be escorted to the PET/CT scanner.  There, staff will get you positioned on the scanner and obtain a blood pressure and EKG.  During the exam, you will continue to be connected to the EKG and blood pressure machines.  A small, safe amount of a radioactive tracer will be injected in your IV to obtain a series of pictures of your heart along with an injection of a stress agent.    After your Exam:  It is recommended that you eat a meal and drink a caffeinated beverage to counter act any effects of the stress agent.  Drink plenty of fluids for the remainder of the day and urinate frequently for the first couple of hours after the exam.  Your doctor will inform you of your test results within 7-10 business days.  For more information and frequently asked questions, please visit our website: [email or web address]  For questions about your test or how to prepare for your test, please call: Cardiac Imaging Nurse Navigators Office: [phone]

## 2023-04-12 NOTE — Progress Notes (Signed)
Office Visit    Patient Name: Miguel Fletcher Date of Encounter: 04/12/2023  Primary Care Provider:  Patient, No Pcp Per Primary Cardiologist:  Julien Nordmann, MD  Chief Complaint    68 y.o. 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 08/2020 MRI [facility]): Well-circumscribed T1 hypointense, T2 hyperintense enhancing mass within the right axilla measuring 3.1 x 2.6 x 3.0 cm.  Mass extends along the course of the axillary neurovascular structures.  No evidence for invasion into the adjacent musculature.  No enlarged axillary lymph nodes.   Bell's palsy    Biliary colic    CAD (coronary artery disease)    a. 09/2020 NSTEMI/Cath: LM 60-70d, LAD 70p, D2 60, RI 70p, LCX ild diff dzs, RCA 40-37m, RPDA 50, RPAV 50; b. 10/2020 CABG x 5 (LIMA->LAD, L radial->RI, VG->D1, VG->RPDA->RPL); c. 12/2021 MV: EF 45% (nl by echo), no isch/infart-->low risk.   Diastolic dysfunction    a. 09/2020 Echo: EF 60-65%, no rwma, sev LVH. Gr1 DD. Nl RV size/fxn. Mod dil LA; b. 12/2021 Echo: EF 60-65%, no rwma, sev LVH, GrI DD, low-nl RV fxn, mildly dil LA, mild MR, Ao root 38mm.   Hemorrhoids    Hypercholesterolemia    Hypertension    LVH (left ventricular hypertrophy)    PAD (peripheral artery disease) (HCC)    Stroke (cerebrum) (HCC)    Tobacco use    Past Surgical History:  Procedure Laterality Date   ABDOMINAL AORTOGRAM W/LOWER EXTREMITY Bilateral 10/12/2022   Procedure: ABDOMINAL AORTOGRAM W/LOWER EXTREMITY;  Surgeon: Iran Ouch, MD;  Location: [facility] INVASIVE CV LAB;  Service: Cardiovascular;  Laterality:  Bilateral;   CORONARY ARTERY BYPASS GRAFT N/A 10/28/2020   Procedure: CORONARY ARTERY BYPASS GRAFTING (CABG), ON PUMP, TIMES FIVE, USING LEFT INTERNAL MAMMARY ARTERY, LEFT RADIAL ARTERY AND ENDOSCOPICALLY HARVESTED RIGHT GREATER SAPHENOUS VEIN;  Surgeon: Loreli Slot, MD;  Location: [facility] OR;  Service: Open Heart Surgery;  Laterality: N/A;   CORONARY ULTRASOUND/IVUS N/A 10/06/2020   Procedure: Intravascular Ultrasound/IVUS;  Surgeon: Tonny Bollman, MD;  Location: [facility] INVASIVE CV LAB;  Service: Cardiovascular;  Laterality: N/A;   ENDOVEIN HARVEST OF GREATER SAPHENOUS VEIN Right 10/28/2020   Procedure: ENDOVEIN HARVEST OF RIGHT GREATER SAPHENOUS VEIN;  Surgeon: Loreli Slot, MD;  Location: [facility] OR;  Service: Open Heart Surgery;  Laterality: Right;   IR THORACENTESIS ASP PLEURAL SPACE W/IMG GUIDE  11/12/2020   LEFT HEART CATH AND CORONARY ANGIOGRAPHY N/A 10/06/2020   Procedure: LEFT HEART CATH AND CORONARY ANGIOGRAPHY;  Surgeon: Tonny Bollman, MD;  Location: [facility] INVASIVE CV LAB;  Service: Cardiovascular;  Laterality: N/A;   RADIAL ARTERY HARVEST Left 10/28/2020   Procedure: LEFT RADIAL ARTERY HARVEST;  Surgeon: Loreli Slot, MD;  Location: [facility] OR;  Service: Open Heart Surgery;  Laterality: Left;   TEE WITHOUT CARDIOVERSION N/A 10/28/2020   Procedure: TRANSESOPHAGEAL ECHOCARDIOGRAM (TEE);  Surgeon: Loreli Slot, MD;  Location: [facility] OR;  Service: Open Heart Surgery;  Laterality: N/A;   VARICOSE VEIN SURGERY Left     Allergies  No Known Allergies    History of Present Illness  68 y.o. 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  He has a long history of poorly controlled hypertension as well as noncompliance with medications.  In July 2022, he was admitted to [facility] regional with chest pain and elevated troponin.  EF was  60 to 65% by echo.  He initially left AMA but returned 4 days later with ongoing chest pain, fevers, chills, malaise, and was noted to be COVID-positive.  He underwent diagnostic catheterization revealing severe multivessel CAD as outlined in the past medical history.  He was subsequently referred to CT surgery in the outpatient setting, allowing for him to recover from COVID, and subsequently underwent CABG x 5 in August 2022 (LIMA  LAD, L radial  RI, VG  D1, VG  RPDA  RPL).  He was readmitted in late August 2022 for respiratory distress and pleural effusion requiring thoracentesis.  In October 2023, Mr. Peavey was admitted with atypical chest pain in the setting of medication noncompliance.  Stress testing was undertaken and showed no evidence of ischemia or infarct, though EF was measured at 45%.  This was followed by an echocardiogram showed an EF of 60 to 65% with severe LVH, no regional wall motion abnormalities, and mild MR.  He was medically managed.  In July 2024, he was seen by podiatry with complaints of bilateral leg pain.  ABIs were notable for abnormality on the right at 0.84 (1.06 on the left).  He was seen by Dr. Kirke Corin and underwent abdominal aortogram with lower extremity angiography which showed no significant aortoiliac disease and moderate diffuse bilateral disease. He was placed on cilostazol and medically managed.  PTA of the bilateral tibial arteries was felt to be best reserved for refractory symptoms.   Mr. Gandee was last seen in cardiology clinic in September 2024, at which time he was doing well.  On March 24, 2023, he was seen in the emergency department with a 24-hour history of left-sided anterior lower chest wall pain that was worse with palpation.  He was hypertensive at 208/106.  CTA of the chest was negative for PE.  Troponins were elevated at 91 with follow-up value of 93.  He was discharged home with recommendation for cardiology follow-up.  He has continued to have left  chest wall tenderness since his ED visit.  He does not have a primary care provider and is out of pain medicine.  He also notes substernal chest discomfort often occurring in the morning, dating back several years.  He has chronic dyspnea on exertion and activity is fairly limited.  With the exception of chest wall tenderness, he does not experience exertional chest pain.  He denies palpitations, PND, orthopnea, dizziness, syncope, edema, or early satiety.  Blood pressure is very high today at 240/110.  He says he takes all of his medicines at night but on further questioning, notes that he is not sure which medicines he is taking and despite having 3 different blood pressure medicines and a statin on his medicine list, he is not sure that he is taking any of those. Objective  Home Medications    Current Outpatient Medications  Medication Sig Dispense Refill   amLODipine (NORVASC) 10 MG tablet Take 1 tablet (10 mg total) by mouth daily. 90 tablet 3   aspirin EC 81 MG tablet Take 1 tablet (81 mg total) by mouth daily. Swallow whole. 90 tablet 3  atorvastatin (LIPITOR) 80 MG tablet Take 1 tablet (80 mg total) by mouth daily at 8 pm. 90 tablet 3   clotrimazole-betamethasone (LOTRISONE) cream Apply 1 Application topically daily. 45 g 2   HYDROcodone-acetaminophen (NORCO) 5-325 MG tablet Take 1 tablet by mouth every 4 (four) hours as needed for moderate pain (pain score 4-6). 6 tablet 0   lisinopril (ZESTRIL) 40 MG tablet Take 1 tablet (40 mg total) by mouth daily. 90 tablet 3   potassium chloride SA (KLOR-CON M) 20 MEQ tablet Take 1 tablet (20 mEq total) by mouth 2 (two) times daily. 180 tablet 3   terbinafine (LAMISIL) 250 MG tablet Take 1 tablet (250 mg total) by mouth daily. 30 tablet 0   cilostazol (PLETAL) 50 MG tablet Take 1 tablet (50 mg total) by mouth 2 (two) times daily. 60 tablet 1   spironolactone (ALDACTONE) 25 MG tablet Take 1 tablet (25 mg total) by mouth daily. 30 tablet 1   No current  facility-administered medications for this visit.     Physical Exam    VS:  BP (!) 222/110   Pulse 67   Ht 5\' 8"  (1.727 m)   Wt 204 lb 4 oz (92.6 kg)   BMI 31.06 kg/m  , BMI Body mass index is 31.06 kg/m.     Vitals:   04/12/23 1109 04/12/23 1217  BP: (!) 240/110 (!) 222/110  Pulse: 67       GEN: Well nourished, well developed, in no acute distress. HEENT: normal. Neck: Supple, no JVD, carotid bruits, or masses. Cardiac: RRR, no murmurs, rubs, or gallops. No clubbing, cyanosis, edema.  Radials 2+/PT 2+ and equal bilaterally.  Respiratory:  Respirations regular and unlabored, coarse breath sounds throughout. GI: Soft, nontender, nondistended, BS + x 4. MS: no deformity or atrophy. Skin: warm and dry, no rash. Neuro:  Strength and sensation are intact. Psych: Normal affect.  Accessory Clinical Findings    ECG personally reviewed by me today - EKG Interpretation Date/Time:  Wednesday April 12 2023 11:16:59 EST Ventricular Rate:  67 PR Interval:  250 QRS Duration:  138 QT Interval:  450 QTC Calculation: 475 R Axis:   -53  Text Interpretation: Sinus rhythm with 1st degree A-V block Possible Left atrial enlargement Left axis deviation Left ventricular hypertrophy with QRS widening and repolarization abnormality ( R in aVL , Sokolow-Lyon , Cornell product , Romhilt-Estes ) Cannot rule out Septal infarct (cited on or before 24-Mar-2023) Confirmed by Nicolasa Ducking ([id number]) on 04/12/2023 11:21:47 AM  - no acute changes.  Lab Results  Component Value Date   WBC 5.9 03/24/2023   HGB 13.2 03/24/2023   HCT 39.6 03/24/2023   MCV 93.4 03/24/2023   PLT 171 03/24/2023   Lab Results  Component Value Date   CREATININE 1.18 03/24/2023   BUN 18 03/24/2023   NA 139 03/24/2023   K 3.0 (L) 03/24/2023   CL 99 03/24/2023   CO2 30 03/24/2023   Lab Results  Component Value Date   ALT 12 12/21/2021   AST 21 12/21/2021   ALKPHOS 63 12/21/2021   BILITOT 1.0 12/21/2021   Lab  Results  Component Value Date   CHOL 72 (L) 11/09/2020   HDL 21 (L) 11/09/2020   LDLCALC 33 11/09/2020   LDLDIRECT 30 11/09/2020   TRIG 87 11/09/2020   CHOLHDL 3.4 11/09/2020    Lab Results  Component Value Date   HGBA1C 5.3 10/26/2020        Assessment & Plan  1.  Demand ischemia/CAD: Status post CABG x 5 in August 2022 with nonischemic stress testing in October 2023.  He was recently seen in the emergency department with left sided chest wall tenderness and soreness, worse with palpation.  CT of the chest was negative for PE and symptoms were felt to be musculoskeletal.  That said, troponins were elevated at 91 with a follow-up value of 93.  In addition to ongoing chest wall soreness, he sometimes notes substernal chest discomfort first thing in the morning.  He thinks this dates back at least a year.  In light of chest pain symptoms, chronic noncompliance, and recent demand ischemia, he is at high risk for recurrent coronary events.  Will arrange for [facility] PET/CT.  I reviewed his medicine list today and noted that with the exception of his Pletal and spironolactone, he should have refills for everything waiting for him at his pharmacy.  Plan to continue aspirin and statin with resumption of antihypertensives as outlined below.  2.  Hypertension: Poorly controlled in the setting of chronic noncompliance.  No significant renal artery disease on angiography July 2024.  Patient is not sure which medicines he is taking but thinks he is only taking 1 "heart" medicine.  He should be taking amlodipine 10 mg daily, lisinopril 40 mg daily, and spironolactone 25 mg daily.  Blood pressure was 240/110 today with a repeat of 222/110.  He was offered clonidine 0.1 mg p.o. with plan to have him sit in the waiting area and follow-up blood pressure in an hour however, he has declined.  As above, among his medicines, only spironolactone is due for refill which we are placing today and also obtaining a basic  metabolic panel.  Refills for his other medicines are already at his pharmacy and he has been strongly encouraged to refill his medications and take as prescribed.  I offered to call his wife to discuss his medicine list with her, as he says that she is the one who gives him his medicines however, he says he does not know her number and that the number listed in the computer will be wrong because she has a new phone.  3.  Hyperlipidemia: Unclear if he is taking his statin though he has refills available at the pharmacy.  Discussed this today.  LDL was 78 in 2022 but has not been checked since then.  He is not fasting today.  We will need to follow-up in the future.  4.  Tobacco abuse: Still smoking 1 pack of cigarettes per day.  He is not contemplating quitting at this time.  Cessation strongly advised.  5.  Hypokalemia: Chronic.  Potassium was 3.0 when seen in the ED recently.  He says he is definitely taking supplemental potassium.  He is prescribed spironolactone but unclear if he is taking.  Follow-up lab work today.  Will need to consider serum aldosterone and plasma renin activity in the future.  6.  Disposition: Follow-up basic metabolic panel today.  Follow-up Lexiscan PET/CT to rule out ischemia.  Follow-up in clinic in 2 to 3 weeks or sooner if necessary.  Nicolasa Ducking, NP 04/12/2023, 12:19 PM

## 2023-04-13 LAB — BASIC METABOLIC PANEL
BUN/Creatinine Ratio: 16 (ref 10–24)
BUN: 20 mg/dL (ref 8–27)
CO2: 25 mmol/L (ref 20–29)
Calcium: 9.5 mg/dL (ref 8.6–10.2)
Chloride: 103 mmol/L (ref 96–106)
Creatinine, Ser: 1.22 mg/dL (ref 0.76–1.27)
Glucose: 74 mg/dL (ref 70–99)
Potassium: 4.4 mmol/L (ref 3.5–5.2)
Sodium: 143 mmol/L (ref 134–144)
eGFR: 65 mL/min/{1.73_m2} (ref >59)

## 2023-04-14 ENCOUNTER — Encounter: Payer: Self-pay | Admitting: *Deleted

## 2023-05-02 ENCOUNTER — Telehealth: Payer: Self-pay | Admitting: *Deleted

## 2023-05-02 NOTE — Telephone Encounter (Signed)
Left a message for the patient to call back to reschedule his appointment on 2/13 until after he has had the PET.

## 2023-05-04 ENCOUNTER — Encounter: Payer: Self-pay | Admitting: Nurse Practitioner

## 2023-05-04 ENCOUNTER — Ambulatory Visit: Payer: Medicare HMO | Attending: Nurse Practitioner | Admitting: Nurse Practitioner

## 2023-05-04 VITALS — BP 196/98 | HR 64 | Ht 69.0 in | Wt 206.5 lb

## 2023-05-04 DIAGNOSIS — E785 Hyperlipidemia, unspecified: Secondary | ICD-10-CM

## 2023-05-04 DIAGNOSIS — G510 Bell's palsy: Secondary | ICD-10-CM | POA: Diagnosis not present

## 2023-05-04 DIAGNOSIS — I10 Essential (primary) hypertension: Secondary | ICD-10-CM | POA: Diagnosis not present

## 2023-05-04 DIAGNOSIS — I739 Peripheral vascular disease, unspecified: Secondary | ICD-10-CM | POA: Diagnosis not present

## 2023-05-04 DIAGNOSIS — Z720 Tobacco use: Secondary | ICD-10-CM

## 2023-05-04 DIAGNOSIS — I25118 Atherosclerotic heart disease of native coronary artery with other forms of angina pectoris: Secondary | ICD-10-CM

## 2023-05-04 DIAGNOSIS — E876 Hypokalemia: Secondary | ICD-10-CM

## 2023-05-04 MED ORDER — CLONIDINE HCL 0.1 MG PO TABS
0.1000 mg | ORAL_TABLET | Freq: Two times a day (BID) | ORAL | 11 refills | Status: DC
Start: 2023-05-04 — End: 2023-07-31

## 2023-05-04 MED ORDER — POTASSIUM CHLORIDE CRYS ER 20 MEQ PO TBCR
20.0000 meq | EXTENDED_RELEASE_TABLET | Freq: Every day | ORAL | 1 refills | Status: DC
Start: 2023-05-04 — End: 2023-07-31

## 2023-05-04 NOTE — Progress Notes (Signed)
Office Visit    Patient Name: Miguel Fletcher Date of Encounter: 05/04/2023  Primary Care Provider:  Patient, No Pcp Per Primary Cardiologist:  Julien Nordmann, MD  Chief Complaint    68 y.o. 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 08/2020 MRI [facility]): Well-circumscribed T1 hypointense, T2 hyperintense enhancing mass within the right axilla measuring 3.1 x 2.6 x 3.0 cm.  Mass extends along the course of the axillary neurovascular structures.  No evidence for invasion into the adjacent musculature.  No enlarged axillary lymph nodes.   Bell's palsy    Biliary colic    CAD (coronary artery disease)    a. 09/2020 NSTEMI/Cath: LM 60-70d, LAD 70p, D2 60, RI 70p, LCX ild diff dzs, RCA 40-26m, RPDA 50, RPAV 50; b. 10/2020 CABG x 5 (LIMA->LAD, L radial->RI, VG->D1, VG->RPDA->RPL); c. 12/2021 MV: EF 45% (nl by echo), no isch/infart-->low risk.   Diastolic dysfunction    a. 09/2020 Echo: EF 60-65%, no rwma, sev LVH. Gr1 DD. Nl RV size/fxn. Mod dil LA; b. 12/2021 Echo: EF 60-65%, no rwma, sev LVH, GrI DD, low-nl RV fxn, mildly dil LA, mild MR, Ao root 38mm.   Hemorrhoids    Hypercholesterolemia    Hypertension    LVH (left ventricular hypertrophy)    PAD (peripheral artery disease) (HCC)    Stroke (cerebrum) (HCC)    Tobacco use    Past Surgical History:  Procedure Laterality Date   ABDOMINAL AORTOGRAM W/LOWER EXTREMITY Bilateral 10/12/2022   Procedure: ABDOMINAL AORTOGRAM W/LOWER EXTREMITY;  Surgeon: Iran Ouch, MD;  Location: [facility] INVASIVE CV LAB;  Service: Cardiovascular;  Laterality: Bilateral;   CORONARY ARTERY  BYPASS GRAFT N/A 10/28/2020   Procedure: CORONARY ARTERY BYPASS GRAFTING (CABG), ON PUMP, TIMES FIVE, USING LEFT INTERNAL MAMMARY ARTERY, LEFT RADIAL ARTERY AND ENDOSCOPICALLY HARVESTED RIGHT GREATER SAPHENOUS VEIN;  Surgeon: Loreli Slot, MD;  Location: [facility] OR;  Service: Open Heart Surgery;  Laterality: N/A;   CORONARY ULTRASOUND/IVUS N/A 10/06/2020   Procedure: Intravascular Ultrasound/IVUS;  Surgeon: Tonny Bollman, MD;  Location: [facility] INVASIVE CV LAB;  Service: Cardiovascular;  Laterality: N/A;   ENDOVEIN HARVEST OF GREATER SAPHENOUS VEIN Right 10/28/2020   Procedure: ENDOVEIN HARVEST OF RIGHT GREATER SAPHENOUS VEIN;  Surgeon: Loreli Slot, MD;  Location: [facility] OR;  Service: Open Heart Surgery;  Laterality: Right;   IR THORACENTESIS ASP PLEURAL SPACE W/IMG GUIDE  11/12/2020   LEFT HEART CATH AND CORONARY ANGIOGRAPHY N/A 10/06/2020   Procedure: LEFT HEART CATH AND CORONARY ANGIOGRAPHY;  Surgeon: Tonny Bollman, MD;  Location: [facility] INVASIVE CV LAB;  Service: Cardiovascular;  Laterality: N/A;   RADIAL ARTERY HARVEST Left 10/28/2020   Procedure: LEFT RADIAL ARTERY HARVEST;  Surgeon: Loreli Slot, MD;  Location: [facility] OR;  Service: Open Heart Surgery;  Laterality: Left;   TEE WITHOUT CARDIOVERSION N/A 10/28/2020   Procedure: TRANSESOPHAGEAL ECHOCARDIOGRAM (TEE);  Surgeon: Loreli Slot, MD;  Location: [facility] OR;  Service: Open Heart Surgery;  Laterality: N/A;   VARICOSE VEIN SURGERY Left     Allergies  No Known Allergies    History of Present Illness      68 y.o.  y/o male 68 y.o. 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  He has a long history of poorly controlled hypertension as well as noncompliance with medications.  In July 2022, he was admitted to [facility] regional with chest pain and elevated troponin.  EF was 60 to 65% by  echo.  He initially left AMA but returned 4 days later with ongoing chest pain, fevers, chills, malaise, and was noted to be COVID-positive.  He underwent diagnostic catheterization revealing severe multivessel CAD as outlined in the past medical history.  He was subsequently referred to CT surgery in the outpatient setting, allowing for him to recover from COVID, and subsequently underwent CABG x 5 in August 2022 (LIMA  LAD, L radial  RI, VG  D1, VG  RPDA  RPL).  He was readmitted in late August 2022 for respiratory distress and pleural effusion requiring thoracentesis.  In October 2023, Miguel Fletcher was admitted with atypical chest pain in the setting of medication noncompliance.  Stress testing was undertaken and showed no evidence of ischemia or infarct, though EF was measured at 45%.  This was followed by an echocardiogram showed an EF of 60 to 65% with severe LVH, no regional wall motion abnormalities, and mild MR.  He was medically managed.  In July 2024, he was seen by podiatry with complaints of bilateral leg pain.  ABIs were notable for abnormality on the right at 0.84 (1.06 on the left).  He was seen by Dr. Kirke Corin and underwent abdominal aortogram with lower extremity angiography which showed no significant aortoiliac disease and moderate diffuse bilateral disease. He was placed on cilostazol and medically managed.  PTA of the bilateral tibial arteries was felt to be best reserved for refractory symptoms.     Miguel Fletcher was last seen in cardiology clinic in January 2025, at which time he reported intermittent chest pain, and he was markedly hypertensive in the setting of noncompliance with his medications.  We refilled his medications though he says he is already out of spironolactone and amlodipine.  This morning, he only took his lisinopril and his blood pressure is in the 190-200/90-100 range.  He still has intermittent chest pain, most notable when he turns at night but sometimes present  throughout the day.  He also has chronic dyspnea on exertion which he notices when he walks a lot at work.  He continues to smoke at least 1 pack of cigarettes per day.  He says he is trying to do better with compliance with medicines.  He denies adding salt to food but then notes that he eats lots of sandwiches with processed meats.  He was not aware that this was a major contributor to salt in her diet.  He denies palpitations, PND, orthopnea, dizziness, syncope, edema, or early satiety. Objective  Home Medications    Current Outpatient Medications  Medication Sig Dispense Refill   amLODipine (NORVASC) 10 MG tablet Take 1 tablet (10 mg total) by mouth daily. 90 tablet 3   aspirin EC 81 MG tablet Take 1 tablet (81 mg total) by mouth daily. Swallow whole. 90 tablet 3   atorvastatin (LIPITOR) 80 MG tablet Take 1 tablet (80 mg total) by mouth daily at 8 pm. 90 tablet 3   cilostazol (PLETAL) 50 MG tablet Take 1 tablet (50 mg total) by mouth 2 (two) times daily. 60 tablet 1   clotrimazole-betamethasone (LOTRISONE)  cream Apply 1 Application topically daily. 45 g 2   HYDROcodone-acetaminophen (NORCO) 5-325 MG tablet Take 1 tablet by mouth every 4 (four) hours as needed for moderate pain (pain score 4-6). 6 tablet 0   lisinopril (ZESTRIL) 40 MG tablet Take 1 tablet (40 mg total) by mouth daily. 90 tablet 3   potassium chloride SA (KLOR-CON M) 20 MEQ tablet Take 1 tablet (20 mEq total) by mouth 2 (two) times daily. 180 tablet 3   spironolactone (ALDACTONE) 25 MG tablet Take 1 tablet (25 mg total) by mouth daily. 30 tablet 1   terbinafine (LAMISIL) 250 MG tablet Take 1 tablet (250 mg total) by mouth daily. 30 tablet 0   No current facility-administered medications for this visit.     Physical Exam    VS:  BP (!) 196/98   Pulse 64   Ht 5\' 9"  (1.753 m)   Wt 206 lb 8 oz (93.7 kg)   SpO2 95%   BMI 30.49 kg/m  , BMI Body mass index is 30.49 kg/m.     Vitals:   05/04/23 1005 05/04/23 1045  BP: (!)  198/100 (!) 196/98  Pulse: 64   SpO2: 95%       GEN: Well nourished, well developed, in no acute distress. HEENT: normal. Neck: Supple, no JVD, carotid bruits, or masses. Cardiac: RRR, 2/6 systolic murmur heard along the left sternal border.  No rubs or gallops. No clubbing, cyanosis, edema.  Radials 2+/PT 2+ and equal bilaterally.  Respiratory:  Respirations regular and unlabored, clear to auscultation bilaterally. GI: Soft, nontender, nondistended, BS + x 4. MS: no deformity or atrophy. Skin: warm and dry, no rash. Neuro:  Strength and sensation are intact.  Chronic right-sided facial droop. Psych: Normal affect.  Accessory Clinical Findings    ECG personally reviewed by me today - EKG Interpretation Date/Time:  Thursday May 04 2023 10:09:16 EST Ventricular Rate:  64 PR Interval:  240 QRS Duration:  140 QT Interval:  460 QTC Calculation: 474 R Axis:   -51  Text Interpretation: Sinus rhythm with 1st degree A-V block Possible Left atrial enlargement Left axis deviation Left ventricular hypertrophy with QRS widening and repolarization abnormality ( R in aVL , Sokolow-Lyon , Cornell product , Romhilt-Estes ) Cannot rule out Septal infarct (cited on or before 24-Mar-2023) Confirmed by Nicolasa Ducking ([id number]) on 05/04/2023 10:21:02 AM  - no acute changes.  Lab Results  Component Value Date   WBC 5.9 03/24/2023   HGB 13.2 03/24/2023   HCT 39.6 03/24/2023   MCV 93.4 03/24/2023   PLT 171 03/24/2023   Lab Results  Component Value Date   CREATININE 1.22 04/12/2023   BUN 20 04/12/2023   NA 143 04/12/2023   K 4.4 04/12/2023   CL 103 04/12/2023   CO2 25 04/12/2023   Lab Results  Component Value Date   ALT 12 12/21/2021   AST 21 12/21/2021   ALKPHOS 63 12/21/2021   BILITOT 1.0 12/21/2021   Lab Results  Component Value Date   CHOL 72 (L) 11/09/2020   HDL 21 (L) 11/09/2020   LDLCALC 33 11/09/2020   LDLDIRECT 30 11/09/2020   TRIG 87 11/09/2020   CHOLHDL 3.4  11/09/2020    Lab Results  Component Value Date   HGBA1C 5.3 10/26/2020        Assessment & Plan    1.  Primary hypertension: Poorly controlled in the setting of chronic noncompliance.  No significant renal artery disease on angiography in July 2024.  Though compliance seems to have improved and he has been taking spironolactone, lisinopril, and amlodipine, he notes that he is already run out of amlodipine and ran out of spironolactone yesterday.  He has refills on the former through June, and on the latter through April however, he did not realize he needed to let his pharmacy know to go ahead and refill them.  His wife checks his blood pressure at home and despite taking his medicines, he still is typically in the 190-200 range.  He is in that range today on multiple readings (only took lisinopril this morning).  I instructed him to go pick up his refills at the pharmacy and I am adding clonidine 0.1 mg twice daily.  Similar to his last visit, he declined a dose of clonidine while here.  Strongly encouraged smoking cessation and reduction of processed foods in his diet, as well as compliance with medications.  2.  Coronary artery disease/precordial pain: He was seen in January following ED visit for atypical chest pain but with mild troponin elevation suggestive of demand ischemia.  I arranged a Lexiscan PET/CT at that time however, this is not scheduled until February 20.  He was not aware of this.  He does plan to carry through with the stress test.  He has continued to have intermittent chest pain, mostly occurring at night and seemingly more musculoskeletal in nature.  He has chronic dyspnea on exertion.  Strongly encourage medication compliance.  Continue aspirin and statin.  3.  Hyperlipidemia: Reports compliance with statin therapy.  Will need follow-up labs in the future.  4.  Tobacco abuse: Still smoking a pack of cigarettes per day.  He is not contemplating quitting at this time.   Cessation strongly advised.  5.  Hypokalemia: Chronic, though improved at 4.4 on evaluation in January.  He would like to reduce his potassium chloride if possible.  I advised that as long as he is compliant with his spironolactone, we can reduce his potassium chloride to 20 mEq daily.  He is going to his pharmacy to pick up spironolactone today.  6.  History of Bell's palsy: Patient is bothered by chronic right-sided facial droop which he says is causing his right eye to close.  He has not addressed this with his primary care provider.  He has requested a neurology referral which we have placed today.    7.  Peripheral arterial disease: Peripheral angiography in 2024 with no significant aortoiliac disease and moderate diffuse bilateral lower extremity disease for which she is on cilostazol.  He notes chronic leg pain though this does not seem to limit his walking as much as his dyspnea on exertion.    8.  Disposition: He is scheduled for Lexiscan PET/CT on February 20.  Follow-up in clinic in 1 month or sooner if necessary.  Nicolasa Ducking, NP 05/04/2023, 10:47 AM

## 2023-05-04 NOTE — Patient Instructions (Signed)
Medication Instructions:  Your physician recommends the following medication changes.  START TAKING: Clonidine 0.1 mg twice daily  DECREASE: Potassium 20 mEq daily  *If you need a refill on your cardiac medications before your next appointment, please call your pharmacy*   Lab Work: None ordered If you have labs (blood work) drawn today and your tests are completely normal, you will receive your results only by: MyChart Message (if you have MyChart) OR A paper copy in the mail If you have any lab test that is abnormal or we need to change your treatment, we will call you to review the results.   Testing/Procedures: None ordered   Follow-Up: At [facility], you and your health needs are our priority.  As part of our continuing mission to provide you with exceptional heart care, we have created designated Provider Care Teams.  These Care Teams include your primary Cardiologist (physician) and Advanced Practice Providers (APPs -  Physician Assistants and Nurse Practitioners) who all work together to provide you with the care you need, when you need it.  We recommend signing up for the patient portal called "MyChart".  Sign up information is provided on this After Visit Summary.  MyChart is used to connect with patients for Virtual Visits (Telemedicine).  Patients are able to view lab/test results, encounter notes, upcoming appointments, etc.  Non-urgent messages can be sent to your provider as well.   To learn more about what you can do with MyChart, go to [email or web address].    Your next appointment:   1 month(s)  Provider:   You may see Julien Nordmann, MD or one of the following Advanced Practice Providers on your designated Care Team:   Nicolasa Ducking, NP  Other Instructions A referral has been placed to [facility] Neurology

## 2023-05-10 ENCOUNTER — Telehealth (HOSPITAL_COMMUNITY): Payer: Self-pay | Admitting: Emergency Medicine

## 2023-05-10 NOTE — Telephone Encounter (Signed)
Attempted to call patient regarding upcoming cardiac PET appointment. Left message on voicemail with name and callback number Aidan Moten RN Navigator Cardiac Imaging [facility] [phone] Office [phone] Cell  

## 2023-05-11 ENCOUNTER — Ambulatory Visit: Admission: RE | Admit: 2023-05-11 | Payer: Medicare HMO | Source: Ambulatory Visit

## 2023-06-07 ENCOUNTER — Encounter: Payer: Self-pay | Admitting: Dermatology

## 2023-06-07 ENCOUNTER — Ambulatory Visit (INDEPENDENT_AMBULATORY_CARE_PROVIDER_SITE_OTHER): Payer: Medicare Other | Admitting: Dermatology

## 2023-06-07 DIAGNOSIS — D492 Neoplasm of unspecified behavior of bone, soft tissue, and skin: Secondary | ICD-10-CM | POA: Diagnosis not present

## 2023-06-07 DIAGNOSIS — Z7189 Other specified counseling: Secondary | ICD-10-CM | POA: Diagnosis not present

## 2023-06-07 DIAGNOSIS — M79601 Pain in right arm: Secondary | ICD-10-CM

## 2023-06-07 NOTE — Patient Instructions (Signed)

## 2023-06-07 NOTE — Progress Notes (Signed)
   New Patient Visit   Subjective  Miguel Fletcher is a 68 y.o. male who presents for the following: growth R axilla, 89yrs, hx of pain, hx of numbness going down arm The patient has spots, moles and lesions to be evaluated, some may be new or changing and the patient may have concern these could be cancer.  New patient referral from Shane Crutch, PA-C at [facility].  The following portions of the chart were reviewed this encounter and updated as appropriate: medications, allergies, medical history  Review of Systems:  No other skin or systemic complaints except as noted in HPI or Assessment and Plan.  Objective  Well appearing patient in no apparent distress; mood and affect are within normal limits.   A focused examination was performed of the following areas: Bil axilla  Relevant exam findings are noted in the Assessment and Plan.   R axilla      L axilla   Assessment & Plan   NEOPLASM Lymphadenopathy (tender with numbness and neurologic symptoms in Right arm) vs other  R axilla Exam: tender firm nodule 5.0cm R axilla, L axilla clear Treatment Plan: Will likely need imaging and evaluation by a surgeon  NEOPLASM OF SOFT TISSUE   Related Procedures Ambulatory referral to General Surgery PAIN OF RIGHT ARM   COUNSELING AND COORDINATION OF CARE    No follow-ups on file.  I, Ardis Rowan, RMA, am acting as scribe for Armida Sans, MD .   Documentation: I have reviewed the above documentation for accuracy and completeness, and I agree with the above.  Armida Sans, MD

## 2023-06-13 ENCOUNTER — Ambulatory Visit: Payer: Medicare HMO | Attending: Nurse Practitioner | Admitting: Nurse Practitioner

## 2023-06-13 ENCOUNTER — Encounter: Payer: Self-pay | Admitting: Nurse Practitioner

## 2023-06-13 VITALS — BP 199/96 | HR 59 | Ht 69.0 in | Wt 207.0 lb

## 2023-06-13 DIAGNOSIS — I25118 Atherosclerotic heart disease of native coronary artery with other forms of angina pectoris: Secondary | ICD-10-CM | POA: Diagnosis not present

## 2023-06-13 DIAGNOSIS — G510 Bell's palsy: Secondary | ICD-10-CM

## 2023-06-13 DIAGNOSIS — E876 Hypokalemia: Secondary | ICD-10-CM

## 2023-06-13 DIAGNOSIS — E785 Hyperlipidemia, unspecified: Secondary | ICD-10-CM

## 2023-06-13 DIAGNOSIS — I10 Essential (primary) hypertension: Secondary | ICD-10-CM

## 2023-06-13 DIAGNOSIS — Z720 Tobacco use: Secondary | ICD-10-CM

## 2023-06-13 DIAGNOSIS — I739 Peripheral vascular disease, unspecified: Secondary | ICD-10-CM

## 2023-06-13 MED ORDER — AMLODIPINE BESYLATE 10 MG PO TABS: 10.0000 mg | ORAL_TABLET | Freq: Every day | ORAL | 3 refills | Status: AC

## 2023-06-13 MED ORDER — ATORVASTATIN CALCIUM 80 MG PO TABS: 80.0000 mg | ORAL_TABLET | Freq: Every day | ORAL | 3 refills | Status: AC

## 2023-06-13 MED ORDER — LISINOPRIL 40 MG PO TABS: 40.0000 mg | ORAL_TABLET | Freq: Every day | ORAL | 3 refills | Status: AC

## 2023-06-13 MED ORDER — SPIRONOLACTONE 25 MG PO TABS: 25.0000 mg | ORAL_TABLET | Freq: Every day | ORAL | 1 refills | Status: AC

## 2023-06-13 NOTE — Patient Instructions (Signed)
 Medication Instructions:  No changes *If you need a refill on your cardiac medications before your next appointment, please call your pharmacy*   Lab Work: None ordered If you have labs (blood work) drawn today and your tests are completely normal, you will receive your results only by: MyChart Message (if you have MyChart) OR A paper copy in the mail If you have any lab test that is abnormal or we need to change your treatment, we will call you to review the results.   Testing/Procedures: None ordered   Follow-Up: At [facility], you and your health needs are our priority.  As part of our continuing mission to provide you with exceptional heart care, we have created designated Provider Care Teams.  These Care Teams include your primary Cardiologist (physician) and Advanced Practice Providers (APPs -  Physician Assistants and Nurse Practitioners) who all work together to provide you with the care you need, when you need it.  We recommend signing up for the patient portal called "MyChart".  Sign up information is provided on this After Visit Summary.  MyChart is used to connect with patients for Virtual Visits (Telemedicine).  Patients are able to view lab/test results, encounter notes, upcoming appointments, etc.  Non-urgent messages can be sent to your provider as well.   To learn more about what you can do with MyChart, go to [email or web address].    Your next appointment:   1 month(s)  Provider:   You may see Julien Nordmann, MD or one of the following Advanced Practice Providers on your designated Care Team:   Nicolasa Ducking, NP  Other Instructions You PET test is scheduled on 06/22/23.

## 2023-06-13 NOTE — Progress Notes (Signed)
 Office Visit    Patient Name: Miguel Fletcher Date of Encounter: 06/13/2023  Primary Care Provider:  Patient, No Pcp Per Primary Cardiologist:  Miguel Nordmann, MD  Chief Complaint    68 y.o. 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 08/2020 MRI [facility]): Well-circumscribed T1 hypointense, T2 hyperintense enhancing mass within the right axilla measuring 3.1 x 2.6 x 3.0 cm.  Mass extends along the course of the axillary neurovascular structures.  No evidence for invasion into the adjacent musculature.  No enlarged axillary lymph nodes.   Bell's palsy    Biliary colic    CAD (coronary artery disease)    a. 09/2020 NSTEMI/Cath: LM 60-70d, LAD 70p, D2 60, RI 70p, LCX ild diff dzs, RCA 40-45m, RPDA 50, RPAV 50; b. 10/2020 CABG x 5 (LIMA->LAD, L radial->RI, VG->D1, VG->RPDA->RPL); c. 12/2021 MV: EF 45% (nl by echo), no isch/infart-->low risk.   Diastolic dysfunction    a. 09/2020 Echo: EF 60-65%, no rwma, sev LVH. Gr1 DD. Nl RV size/fxn. Mod dil LA; b. 12/2021 Echo: EF 60-65%, no rwma, sev LVH, GrI DD, low-nl RV fxn, mildly dil LA, mild MR, Ao root 38mm.   Hemorrhoids    Hypercholesterolemia    Hypertension    LVH (left ventricular hypertrophy)    PAD (peripheral artery disease) (HCC)    Stroke (cerebrum) (HCC)    Tobacco use    Past Surgical History:  Procedure Laterality Date   ABDOMINAL AORTOGRAM W/LOWER EXTREMITY Bilateral 10/12/2022   Procedure: ABDOMINAL AORTOGRAM W/LOWER EXTREMITY;  Surgeon: Miguel Ouch, MD;  Location: [facility] INVASIVE CV LAB;  Service: Cardiovascular;  Laterality: Bilateral;    CORONARY ARTERY BYPASS GRAFT N/A 10/28/2020   Procedure: CORONARY ARTERY BYPASS GRAFTING (CABG), ON PUMP, TIMES FIVE, USING LEFT INTERNAL MAMMARY ARTERY, LEFT RADIAL ARTERY AND ENDOSCOPICALLY HARVESTED RIGHT GREATER SAPHENOUS VEIN;  Surgeon: Miguel Slot, MD;  Location: [facility] OR;  Service: Open Heart Surgery;  Laterality: N/A;   CORONARY ULTRASOUND/IVUS N/A 10/06/2020   Procedure: Intravascular Ultrasound/IVUS;  Surgeon: Miguel Bollman, MD;  Location: [facility] INVASIVE CV LAB;  Service: Cardiovascular;  Laterality: N/A;   ENDOVEIN HARVEST OF GREATER SAPHENOUS VEIN Right 10/28/2020   Procedure: ENDOVEIN HARVEST OF RIGHT GREATER SAPHENOUS VEIN;  Surgeon: Miguel Slot, MD;  Location: [facility] OR;  Service: Open Heart Surgery;  Laterality: Right;   IR THORACENTESIS ASP PLEURAL SPACE W/IMG GUIDE  11/12/2020   LEFT HEART CATH AND CORONARY ANGIOGRAPHY N/A 10/06/2020   Procedure: LEFT HEART CATH AND CORONARY ANGIOGRAPHY;  Surgeon: Miguel Bollman, MD;  Location: [facility] INVASIVE CV LAB;  Service: Cardiovascular;  Laterality: N/A;   RADIAL ARTERY HARVEST Left 10/28/2020   Procedure: LEFT RADIAL ARTERY HARVEST;  Surgeon: Miguel Slot, MD;  Location: [facility] OR;  Service: Open Heart Surgery;  Laterality: Left;   TEE WITHOUT CARDIOVERSION N/A 10/28/2020   Procedure: TRANSESOPHAGEAL ECHOCARDIOGRAM (TEE);  Surgeon: Miguel Slot, MD;  Location: [facility] OR;  Service: Open Heart Surgery;  Laterality: N/A;   VARICOSE VEIN SURGERY Left     Allergies  No Known Allergies    History of Present Illness      68  y.o. 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  He has a long history of poorly controlled hypertension as well as noncompliance with medications.  In July 2022, he was admitted to [facility] regional with chest pain and elevated troponin.  EF was 60 to 65% by  echo.  He initially left AMA but returned 4 days later with ongoing chest pain, fevers, chills, malaise, and was noted to be COVID-positive.  He underwent diagnostic catheterization revealing severe multivessel CAD as outlined in the past medical history.  He was subsequently referred to CT surgery in the outpatient setting, allowing for him to recover from COVID, and subsequently underwent CABG x 5 in August 2022 (LIMA  LAD, L radial  RI, VG  D1, VG  RPDA  RPL).  He was readmitted in late August 2022 for respiratory distress and pleural effusion requiring thoracentesis.  In October 2023, Miguel Fletcher was admitted with atypical chest pain in the setting of medication noncompliance.  Stress testing was undertaken and showed no evidence of ischemia or infarct, though EF was measured at 45%.  This was followed by an echocardiogram showed an EF of 60 to 65% with severe LVH, no regional wall motion abnormalities, and mild MR.  He was medically managed.  In July 2024, he was seen by podiatry with complaints of bilateral leg pain.  ABIs were notable for abnormality on the right at 0.84 (1.06 on the left).  He was seen by Dr. Kirke Fletcher and underwent abdominal aortogram with lower extremity angiography which showed no significant aortoiliac disease and moderate diffuse bilateral disease. He was placed on cilostazol and medically managed.  PTA of the bilateral tibial arteries was felt to be best reserved for refractory symptoms.    Miguel Fletcher has been seen twice in cardiology clinic earlier this year in the setting of poorly controlled hypertension and noncompliance.  He has also been having intermittent chest discomfort which is somewhat reproducible on examination with palpation over his chest wall.  At most previous visits, he was not taking any of his home medications noting that he did not have any refills however, upon review of his medicines, he had multiple refills available however, he just did not not obtain from  his pharmacy.  Following his last visit, he was scheduled for a Lexiscan PET/CT in the setting of ongoing chest pain however this has been rescheduled to April 3.  He says that following his last visit he received 2 weeks of his medications from his pharmacy but has not been taking any medicines over the past 2 weeks.  He is not sure what he was taking or what medicines he currently has at home.  We reviewed his medicine list in detail today.  Patient has Medicaid and Medicare and plans to go to his pharmacy today to pick up his medicines in the setting of ongoing marked hypertension.  He continues to smoke a half a pack a day and is not currently considering quitting.  He has chronic dyspnea on exertion.  He denies palpitations, PND, orthopnea, dizziness, syncope, edema, or early satiety.  He is bothered by a nodule in his right axilla which is very tender and was recently evaluated by dermatology.  He was referred to general surgery for further evaluation. Objective  Home Medications    Current Outpatient Medications  Medication Sig Dispense Refill   aspirin EC 81 MG tablet  Take 1 tablet (81 mg total) by mouth daily. Swallow whole. 90 tablet 3   cilostazol (PLETAL) 50 MG tablet Take 1 tablet (50 mg total) by mouth 2 (two) times daily. 60 tablet 1   cloNIDine (CATAPRES) 0.1 MG tablet Take 1 tablet (0.1 mg total) by mouth 2 (two) times daily. 60 tablet 11   clotrimazole-betamethasone (LOTRISONE) cream Apply 1 Application topically daily. 45 g 2   HYDROcodone-acetaminophen (NORCO) 5-325 MG tablet Take 1 tablet by mouth every 4 (four) hours as needed for moderate pain (pain score 4-6). 6 tablet 0   potassium chloride SA (KLOR-CON M) 20 MEQ tablet Take 1 tablet (20 mEq total) by mouth daily. 90 tablet 1   terbinafine (LAMISIL) 250 MG tablet Take 1 tablet (250 mg total) by mouth daily. 30 tablet 0   amLODipine (NORVASC) 10 MG tablet Take 1 tablet (10 mg total) by mouth daily. 90 tablet 3   atorvastatin  (LIPITOR) 80 MG tablet Take 1 tablet (80 mg total) by mouth daily at 8 pm. 90 tablet 3   lisinopril (ZESTRIL) 40 MG tablet Take 1 tablet (40 mg total) by mouth daily. 90 tablet 3   spironolactone (ALDACTONE) 25 MG tablet Take 1 tablet (25 mg total) by mouth daily. 90 tablet 1   No current facility-administered medications for this visit.     Physical Exam    VS:  BP (!) 199/96   Pulse (!) 59   Ht 5\' 9"  (1.753 m)   Wt 207 lb (93.9 kg)   SpO2 99%   BMI 30.57 kg/m  , BMI Body mass index is 30.57 kg/m.     Vitals:   06/13/23 0918 06/13/23 1107  BP: (!) 202/110 (!) 199/96  Pulse: (!) 59   SpO2: 99%       GEN: Well nourished, well developed, in no acute distress. HEENT: normal. Neck: Supple, no JVD, carotid bruits, or masses. Cardiac: RRR, no murmurs, rubs, or gallops. No clubbing, cyanosis, edema.  Radials 2+ and equal bilaterally.  Chest wall tenderness upon light palpation across the precordium. Respiratory:  Respirations regular and unlabored, scattered rhonchi. GI: Soft, nontender, nondistended, BS + x 4. MS: no deformity or atrophy. Skin: warm and dry, no rash. Neuro:  Strength and sensation are intact. Psych: Normal affect.  Accessory Clinical Findings     Lab Results  Component Value Date   WBC 5.9 03/24/2023   HGB 13.2 03/24/2023   HCT 39.6 03/24/2023   MCV 93.4 03/24/2023   PLT 171 03/24/2023   Lab Results  Component Value Date   CREATININE 1.22 04/12/2023   BUN 20 04/12/2023   NA 143 04/12/2023   K 4.4 04/12/2023   CL 103 04/12/2023   CO2 25 04/12/2023   Lab Results  Component Value Date   ALT 12 12/21/2021   AST 21 12/21/2021   ALKPHOS 63 12/21/2021   BILITOT 1.0 12/21/2021   Lab Results  Component Value Date   CHOL 72 (L) 11/09/2020   HDL 21 (L) 11/09/2020   LDLCALC 33 11/09/2020   LDLDIRECT 30 11/09/2020   TRIG 87 11/09/2020   CHOLHDL 3.4 11/09/2020    Lab Results  Component Value Date   HGBA1C 5.3 10/26/2020       Assessment &  Plan    1.  Primary hypertension: Blood pressure remains markedly elevated and poorly controlled in the setting of chronic noncompliance.  He had no significant renal artery stenosis on angiography in July 2024.  Today, he reports  that he does not have any of his medicines at home, having run out 2 weeks ago.  We had similar conversations in February and in January.  At times he says he has trouble affording his medicines but then notes that his medicines are very inexpensive because of his Medicaid.  Pressure was initially 202/110 today.  I stressed the importance of medication compliance and had a frank conversation about the long-term ramifications of persistently elevated blood pressures.  I offered to provide a dose of clonidine in the office today.  Patient prefers to go to the pharmacy and pick up his medications.  In the setting of noncompliance, it is not clear if doses need to be adjusted.  He is currently prescribed clonidine 0.1 mg twice daily, lisinopril 40 mg daily, spironolactone 25 mg daily, and amlodipine 10 mg daily.  We previously had discussions about his processed food intake however, he has not made any significant changes to his diet.  2.  Coronary artery disease/precordial pain: He was seen in the emergency department in January 2025 for atypical chest pain with mild troponin elevation suggestive of demand ischemia.  He was scheduled for Lexiscan PET/CT in February however, this was canceled and rescheduled for April 3.  He continues to have intermittent chest discomfort throughout the day, worse with palpation over his chest wall.  Strongly advised him to follow through with planned stress testing as well as get back on his medicines including antihypertensives, aspirin, and statin.  3.  Hyperlipidemia: Noncompliant with medications.  Says he plans on refilling today.  4.  Tobacco abuse: Still smoking half a pack a day.  He is not currently contemplating quitting.  Cessation strongly  advised.  5.  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  He is supposed be taking potassium and spironolactone though has not been taking either.  As above, stressed importance of compliance.  6.  History of Bell's palsy: Bothered by chronic right-sided facial droop.  We previously placed a neurology referral for him.  7.  Peripheral arterial disease: Angiography in 2024 showed no significant aortoiliac disease and moderate diffuse bilateral lower extremity disease for which he is prescribed cilostazol.  Denies leg pain today.  Encouraged compliance with medications.  8.  Disposition: Follow-up Lexiscan PET/CT as scheduled on April 3.  Strongly encouraged resumption of home medications with plan for follow-up in 1 month.  Provided he resumes his medications, he will need a basic metabolic panel and lipids at follow-up.  Nicolasa Ducking, NP 06/13/2023, 11:07 AM

## 2023-06-21 ENCOUNTER — Telehealth (HOSPITAL_COMMUNITY): Payer: Self-pay | Admitting: *Deleted

## 2023-06-21 NOTE — Telephone Encounter (Signed)
Attempted to call patient regarding upcoming cardiac PET appointment. Left message on voicemail with name and callback number  Monya Kozakiewicz RN Navigator Cardiac Imaging [facility] [phone] Office [phone] Cell  Reminder to avoid caffeine 12 hours prior to appt. 

## 2023-06-22 ENCOUNTER — Ambulatory Visit
Admission: RE | Admit: 2023-06-22 | Discharge: 2023-06-22 | Disposition: A | Discharge status: Home / Self Care | Payer: Medicare HMO | Source: Ambulatory Visit | Attending: Nurse Practitioner | Admitting: Nurse Practitioner

## 2023-06-22 DIAGNOSIS — I25118 Atherosclerotic heart disease of native coronary artery with other forms of angina pectoris: Secondary | ICD-10-CM

## 2023-06-22 MED ORDER — REGADENOSON 0.4 MG/5ML IV SOLN
INTRAVENOUS | Status: AC
  Filled 2023-06-22: qty 5

## 2023-06-22 MED ORDER — RUBIDIUM RB82 GENERATOR (RUBYFILL)
25.0000 | PACK | Freq: Once | INTRAVENOUS | Status: AC
  Administered 2023-06-22: 24.22 via INTRAVENOUS

## 2023-06-22 MED ORDER — REGADENOSON 0.4 MG/5ML IV SOLN
0.4000 mg | Freq: Once | INTRAVENOUS | Status: AC
  Administered 2023-06-22: 0.4 mg via INTRAVENOUS
  Filled 2023-06-22: qty 5

## 2023-06-22 MED ORDER — RUBIDIUM RB82 GENERATOR (RUBYFILL)
25.0000 | PACK | Freq: Once | INTRAVENOUS | Status: AC
  Administered 2023-06-22: 24.62 via INTRAVENOUS

## 2023-06-22 NOTE — Progress Notes (Signed)
 Patient presents for a cardiac PET stress test and tolerated procedure without incident. Patient maintained acceptable vital signs throughout the test and was offered caffeine after test.  Patient ambulated out of department with a steady gait.

## 2023-06-23 LAB — NM PET CT CARDIAC PERFUSION MULTI W/ABSOLUTE BLOODFLOW
LV dias vol: 308 mL (ref 62–150)
LV sys vol: 194 mL
Nuc Rest EF: 35 %
Nuc Stress EF: 37 %
Peak HR: 75 {beats}/min
Rest HR: 64 {beats}/min
Rest Nuclear Isotope Dose: 24.6 mCi
SRS: 0
SSS: 1
Stress Nuclear Isotope Dose: 24.2 mCi
TID: 1.48

## 2023-06-30 ENCOUNTER — Encounter: Payer: Self-pay | Admitting: *Deleted

## 2023-06-30 ENCOUNTER — Ambulatory Visit: Admitting: Surgery

## 2023-06-30 ENCOUNTER — Encounter: Payer: Self-pay | Admitting: Surgery

## 2023-06-30 VITALS — BP 164/72 | HR 63 | Ht 68.0 in | Wt 202.0 lb

## 2023-06-30 DIAGNOSIS — R2231 Localized swelling, mass and lump, right upper limb: Secondary | ICD-10-CM

## 2023-06-30 DIAGNOSIS — R590 Localized enlarged lymph nodes: Secondary | ICD-10-CM

## 2023-06-30 NOTE — Progress Notes (Signed)
 06/30/2023  Reason for Visit:  Right axillary mass  Requesting Provider:  Armida Sans, MD  History of Present Illness: Miguel Fletcher is a 68 y.o. male presenting for evaluation of a right axillary mass.  The patient reports that he's had this mass for years, maybe less than 5 years.  However, it's been causing issues over the last 6-8 months.  He reports his main symptoms include pain where the mass is located in the right axilla with associated tingliness that goes down his arm to the fingers in his right hand.  Denies any motor changes but he feels sometimes his right arm gets more fatigued.  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  Denies any skin changes where the mass is located particularly no erythema, induration, or drainage.  He was seen by Dr. Gwen Pounds on 06/07/2023 and was referred to [place] for further evaluation and management.  Of note, the patient has a history of hypertension, coronary artery disease status post CABG x 5 in August 2022.  He had a nuclear medicine PET/CT valuate cardiac perfusion on 06/22/23 which showed evidence of ischemia in the mid to basal anterolateral and basal inferolateral locations with the reduced ejection fraction of 35%.  Dr. Kathrin Greathouse office has tried to reach the patient to discuss the results for potential cardiac catheterization but have been unable to reach the patient.  The patient reports that his dog destroyed his phone recently.  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 08/2020 MRI [facility]): Well-circumscribed T1 hypointense, T2 hyperintense enhancing mass within the right axilla measuring 3.1 x 2.6 x 3.0 cm.  Mass extends along the course of the axillary neurovascular structures.  No evidence for invasion into the adjacent musculature.  No enlarged axillary lymph nodes.   Bell's palsy    Biliary colic    CAD (coronary artery disease)     a. 09/2020 NSTEMI/Cath: LM 60-70d, LAD 70p, D2 60, RI 70p, LCX ild diff dzs, RCA 40-65m, RPDA 50, RPAV 50; b. 10/2020 CABG x 5 (LIMA->LAD, L radial->RI, VG->D1, VG->RPDA->RPL); c. 12/2021 MV: EF 45% (nl by echo), no isch/infart-->low risk.   Diastolic dysfunction    a. 09/2020 Echo: EF 60-65%, no rwma, sev LVH. Gr1 DD. Nl RV size/fxn. Mod dil LA; b. 12/2021 Echo: EF 60-65%, no rwma, sev LVH, GrI DD, low-nl RV fxn, mildly dil LA, mild MR, Ao root 38mm.   Hemorrhoids    Hypercholesterolemia    Hypertension    LVH (left ventricular hypertrophy)    PAD (peripheral artery disease) (HCC)    Stroke (cerebrum) (HCC)    Tobacco use      Past Surgical History: Past Surgical History:  Procedure Laterality Date   ABDOMINAL AORTOGRAM W/LOWER EXTREMITY Bilateral 10/12/2022   Procedure: ABDOMINAL AORTOGRAM W/LOWER EXTREMITY;  Surgeon: Iran Ouch, MD;  Location: [facility] INVASIVE CV LAB;  Service: Cardiovascular;  Laterality: Bilateral;   CORONARY ARTERY BYPASS GRAFT N/A 10/28/2020   Procedure: CORONARY ARTERY BYPASS GRAFTING (CABG), ON PUMP, TIMES FIVE, USING LEFT INTERNAL MAMMARY ARTERY, LEFT RADIAL ARTERY AND ENDOSCOPICALLY HARVESTED RIGHT GREATER SAPHENOUS VEIN;  Surgeon: Loreli Slot, MD;  Location: [facility] OR;  Service: Open Heart Surgery;  Laterality: N/A;   CORONARY ULTRASOUND/IVUS N/A 10/06/2020   Procedure: Intravascular Ultrasound/IVUS;  Surgeon: Tonny Bollman, MD;  Location: [facility] INVASIVE CV LAB;  Service: Cardiovascular;  Laterality: N/A;   ENDOVEIN  HARVEST OF GREATER SAPHENOUS VEIN Right 10/28/2020   Procedure: ENDOVEIN HARVEST OF RIGHT GREATER SAPHENOUS VEIN;  Surgeon: Loreli Slot, MD;  Location: [facility] OR;  Service: Open Heart Surgery;  Laterality: Right;   IR THORACENTESIS ASP PLEURAL SPACE W/IMG GUIDE  11/12/2020   LEFT HEART CATH AND CORONARY ANGIOGRAPHY N/A 10/06/2020   Procedure: LEFT HEART CATH AND CORONARY ANGIOGRAPHY;  Surgeon: Tonny Bollman, MD;  Location: [facility] INVASIVE CV  LAB;  Service: Cardiovascular;  Laterality: N/A;   RADIAL ARTERY HARVEST Left 10/28/2020   Procedure: LEFT RADIAL ARTERY HARVEST;  Surgeon: Loreli Slot, MD;  Location: [facility] OR;  Service: Open Heart Surgery;  Laterality: Left;   TEE WITHOUT CARDIOVERSION N/A 10/28/2020   Procedure: TRANSESOPHAGEAL ECHOCARDIOGRAM (TEE);  Surgeon: Loreli Slot, MD;  Location: [facility] OR;  Service: Open Heart Surgery;  Laterality: N/A;   VARICOSE VEIN SURGERY Left     Home Medications: Prior to Admission medications   Medication Sig Start Date End Date Taking? Authorizing Provider  amLODipine (NORVASC) 10 MG tablet Take 1 tablet (10 mg total) by mouth daily. 06/13/23 06/07/24 Yes Creig Hines, NP  aspirin EC 81 MG tablet Take 1 tablet (81 mg total) by mouth daily. Swallow whole. 09/23/22  Yes Hammock, Lavonna Rua, NP  atorvastatin (LIPITOR) 80 MG tablet Take 1 tablet (80 mg total) by mouth daily at 8 pm. 06/13/23  Yes Creig Hines, NP  cilostazol (PLETAL) 50 MG tablet Take 1 tablet (50 mg total) by mouth 2 (two) times daily. 04/12/23  Yes Creig Hines, NP  cloNIDine (CATAPRES) 0.1 MG tablet Take 1 tablet (0.1 mg total) by mouth 2 (two) times daily. 05/04/23  Yes Creig Hines, NP  clotrimazole-betamethasone (LOTRISONE) cream Apply 1 Application topically daily. 08/05/22  Yes Felecia Shelling, DPM  HYDROcodone-acetaminophen (NORCO) 5-325 MG tablet Take 1 tablet by mouth every 4 (four) hours as needed for moderate pain (pain score 4-6). 03/24/23  Yes Merwyn Katos, MD  lisinopril (ZESTRIL) 40 MG tablet Take 1 tablet (40 mg total) by mouth daily. 06/13/23  Yes Creig Hines, NP  potassium chloride SA (KLOR-CON M) 20 MEQ tablet Take 1 tablet (20 mEq total) by mouth daily. 05/04/23  Yes Creig Hines, NP  spironolactone (ALDACTONE) 25 MG tablet Take 1 tablet (25 mg total) by mouth daily. 06/13/23 09/11/23 Yes Creig Hines, NP  terbinafine  (LAMISIL) 250 MG tablet Take 1 tablet (250 mg total) by mouth daily. 08/05/22  Yes Felecia Shelling, DPM    Allergies: No Known Allergies  Social History:  reports that he has been smoking cigarettes. He started smoking about 27 years ago. He has a 25 pack-year smoking history. He has been exposed to tobacco smoke. He has never used smokeless tobacco. He reports that he does not currently use drugs. He reports that he does not drink alcohol.   Family History: Family History  Problem Relation Age of Onset   Alzheimer's disease Father    Heart disease Father    CAD Neg Hx     Review of Systems: Review of Systems  Constitutional:  Negative for chills and fever.  Respiratory:  Negative for shortness of breath.   Cardiovascular:  Negative for chest pain.  Gastrointestinal:  Negative for nausea and vomiting.  Musculoskeletal:  Positive for joint pain (knee pain).  Neurological:  Positive for tingling and sensory change.    Physical Exam BP (!) 164/72   Pulse 63   Ht 5\' 8"  (1.727  m)   Wt 202 lb (91.6 kg)   SpO2 97%   BMI 30.71 kg/m  CONSTITUTIONAL: No acute distress HEENT:  Normocephalic, atraumatic, extraocular motion intact. RESPIRATORY:  Lungs are clear, and breath sounds are equal bilaterally. Normal respiratory effort without pathologic use of accessory muscles. CARDIOVASCULAR: Heart is regular without murmurs, gallops, or rubs. MUSCULOSKELETAL:  No palpable masses on either side of the chest/breast area.  Normal muscle strength and tone in all four extremities.  No peripheral edema or cyanosis. SKIN:  The patient has an approximately 2.5 x 5 cm mass in the upper right axilla.  The mass feels firm and is tender to touch.  No pulsatile flow noted.  No overlying erythema or induration.   NEUROLOGIC:  Motor and sensation is grossly normal in both upper extremities and hands.  Cranial nerves are grossly intact. PSYCH:  Alert and oriented to person, place and time. Affect is  normal.  Laboratory Analysis: Labs from 04/12/2023: Sodium 143, potassium 4.4, chloride 103, CO2 25, BUN 20, creatinine 1.22.  Imaging: No results found.  Assessment and Plan: This is a 68 y.o. male with a right axillary mass.  - The patient has a 2.5 x 5 cm mass in the right axilla.  Currently it is unclear what this mass may be.  There is no other lymphadenopathy noted in the right axilla or in the left axilla.  Also no palpable masses over the chest.  Discussed with patient that this point the first imaging after do is figure out what this mass may be.  We will start with an ultrasound of the right axilla to further evaluate this.  Based on the findings, the patient may potentially need further imaging as well as a biopsy of this mass.  Discussed with the patient that based on the results, different masses have potentially different forms of treatment.  Once we know better what the cyst, we can discuss better how to treat it. - Patient is in agreement with this plan.  We will schedule him for ultrasound of the right axilla first.  I will call the patient with the results and further plans for likely biopsy. - With regards to his cardiac perfusion study, we have recommended to the patient that he contact his cardiology team so that he can be further evaluated for this. - All of his questions have been answered.  I spent 30 minutes dedicated to the care of this patient on the date of this encounter to include pre-visit review of records, face-to-face time with the patient discussing diagnosis and management, and any post-visit coordination of care.   Howie Ill, MD [facility]

## 2023-06-30 NOTE — Patient Instructions (Addendum)
 Call your Cardiology doctor to be seen sooner at [phone]. They want to go over your PET scan.    You are scheduled for an Ultrasound of your right axilla at [facility] on 07/05/23. You will need to arrive at the [facility] entrance and check in at 4:45 pm.   We will call you with your results and next steps.

## 2023-07-05 ENCOUNTER — Ambulatory Visit
Admission: RE | Admit: 2023-07-05 | Discharge: 2023-07-05 | Disposition: A | Discharge status: Home / Self Care | Source: Ambulatory Visit | Attending: Surgery | Admitting: Surgery

## 2023-07-05 DIAGNOSIS — R590 Localized enlarged lymph nodes: Secondary | ICD-10-CM | POA: Diagnosis present

## 2023-07-05 DIAGNOSIS — R2231 Localized swelling, mass and lump, right upper limb: Secondary | ICD-10-CM | POA: Insufficient documentation

## 2023-07-13 ENCOUNTER — Telehealth: Payer: Self-pay | Admitting: Pharmacy Technician

## 2023-07-13 ENCOUNTER — Other Ambulatory Visit (HOSPITAL_COMMUNITY): Payer: Self-pay

## 2023-07-13 ENCOUNTER — Other Ambulatory Visit: Payer: Self-pay | Admitting: *Deleted

## 2023-07-13 NOTE — Telephone Encounter (Signed)
 Pharmacy Patient Advocate Encounter   Received notification from Onbase that prior authorization for cilostazol  is required/requested.   Insurance verification completed.   The patient is insured through [employer] .   Per test claim: PA required; PA submitted to above mentioned insurance via CoverMyMeds Key/confirmation #/EOC [id number] Status is pending   Pharmacy Patient Advocate Encounter  Received notification from [employer] that Prior Authorization for cilostazol  has been APPROVED from 03/22/23 to 03/20/24. Spoke to pharmacy to process.Copay is $5.00.    PA #/Case ID/Reference #: [id number]

## 2023-07-14 ENCOUNTER — Encounter: Payer: Self-pay | Admitting: Nurse Practitioner

## 2023-07-14 ENCOUNTER — Ambulatory Visit: Attending: Nurse Practitioner | Admitting: Nurse Practitioner

## 2023-07-14 ENCOUNTER — Other Ambulatory Visit: Payer: Self-pay | Admitting: *Deleted

## 2023-07-14 VITALS — BP 160/80 | HR 63 | Ht 68.0 in | Wt 201.8 lb

## 2023-07-14 DIAGNOSIS — R2231 Localized swelling, mass and lump, right upper limb: Secondary | ICD-10-CM

## 2023-07-14 DIAGNOSIS — Z720 Tobacco use: Secondary | ICD-10-CM

## 2023-07-14 DIAGNOSIS — I25118 Atherosclerotic heart disease of native coronary artery with other forms of angina pectoris: Secondary | ICD-10-CM

## 2023-07-14 DIAGNOSIS — I255 Ischemic cardiomyopathy: Secondary | ICD-10-CM

## 2023-07-14 DIAGNOSIS — E876 Hypokalemia: Secondary | ICD-10-CM | POA: Diagnosis not present

## 2023-07-14 DIAGNOSIS — I10 Essential (primary) hypertension: Secondary | ICD-10-CM

## 2023-07-14 MED ORDER — ISOSORBIDE MONONITRATE ER 30 MG PO TB24: 30.0000 mg | ORAL_TABLET | Freq: Every day | ORAL | 3 refills | Status: AC

## 2023-07-14 MED ORDER — NITROGLYCERIN 0.4 MG SL SUBL: 0.4000 mg | SUBLINGUAL_TABLET | SUBLINGUAL | 3 refills | Status: AC | PRN

## 2023-07-14 NOTE — H&P (View-Only) (Signed)
 Office Visit    Patient Name: Miguel Fletcher Date of Encounter: 07/14/2023  Primary Care Provider:  Patient, No Pcp Per Primary Cardiologist:  Miguel Boyden, MD  Chief Complaint    68 y.o. 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 08/2020 MRI [facility]): Well-circumscribed T1 hypointense, T2 hyperintense enhancing mass within the right axilla measuring 3.1 x 2.6 x 3.0 cm.  Mass extends along the course of the axillary neurovascular structures.  No evidence for invasion into the adjacent musculature.  No enlarged axillary lymph nodes.   Bell's palsy    Biliary colic    CAD (coronary artery disease)    a. 09/2020 NSTEMI/Cath: LM 60-70d, LAD 70p, D2 60, RI 70p, LCX mild diff dzs, RCA 40-46m, RPDA 50, RPAV 50; b. 10/2020 CABG x 5 (LIMA->LAD, L radial->RI, VG->D1, VG->RPDA->RPL); c. 12/2021 MV: EF 45% (nl by echo), no isch/infart-->low risk; d. 06/2022 Lexi PET: mid-basal antlat and basal inflat rev ischemia. EF 35% @ rest, 37% w/ stress.   Diastolic dysfunction    a. 09/2020 Echo: EF 60-65%, no rwma, sev LVH. Gr1 DD. Nl RV size/fxn. Mod dil LA; b. 12/2021 Echo: EF 60-65%, no rwma, sev LVH, GrI DD, low-nl RV fxn, mildly dil LA, mild MR, Ao root 38mm.   Hemorrhoids    Hypercholesterolemia    Hypertension    LVH (left ventricular hypertrophy)    PAD (peripheral artery disease) (HCC)    Stroke (cerebrum) (HCC)    Tobacco use    Past Surgical History:  Procedure Laterality Date   ABDOMINAL AORTOGRAM W/LOWER EXTREMITY Bilateral 10/12/2022   Procedure: ABDOMINAL AORTOGRAM W/LOWER EXTREMITY;   Surgeon: Miguel Hamilton, MD;  Location: [facility] INVASIVE CV LAB;  Service: Cardiovascular;  Laterality: Bilateral;   CORONARY ARTERY BYPASS GRAFT N/A 10/28/2020   Procedure: CORONARY ARTERY BYPASS GRAFTING (CABG), ON PUMP, TIMES FIVE, USING LEFT INTERNAL MAMMARY ARTERY, LEFT RADIAL ARTERY AND ENDOSCOPICALLY HARVESTED RIGHT GREATER SAPHENOUS VEIN;  Surgeon: Miguel Higashi, MD;  Location: [facility] OR;  Service: Open Heart Surgery;  Laterality: N/A;   CORONARY ULTRASOUND/IVUS N/A 10/06/2020   Procedure: Intravascular Ultrasound/IVUS;  Surgeon: Miguel Lapping, MD;  Location: [facility] INVASIVE CV LAB;  Service: Cardiovascular;  Laterality: N/A;   ENDOVEIN HARVEST OF GREATER SAPHENOUS VEIN Right 10/28/2020   Procedure: ENDOVEIN HARVEST OF RIGHT GREATER SAPHENOUS VEIN;  Surgeon: Miguel Higashi, MD;  Location: [facility] OR;  Service: Open Heart Surgery;  Laterality: Right;   IR THORACENTESIS ASP PLEURAL SPACE W/IMG GUIDE  11/12/2020   LEFT HEART CATH AND CORONARY ANGIOGRAPHY N/A 10/06/2020   Procedure: LEFT HEART CATH AND CORONARY ANGIOGRAPHY;  Surgeon: Miguel Lapping, MD;  Location: [facility] INVASIVE CV LAB;  Service: Cardiovascular;  Laterality: N/A;   RADIAL ARTERY HARVEST Left 10/28/2020   Procedure: LEFT RADIAL ARTERY HARVEST;  Surgeon: Miguel Higashi, MD;  Location: [facility] OR;  Service: Open Heart Surgery;  Laterality: Left;   TEE WITHOUT CARDIOVERSION N/A 10/28/2020   Procedure: TRANSESOPHAGEAL ECHOCARDIOGRAM (TEE);  Surgeon: Miguel Higashi, MD;  Location: [facility] OR;  Service: Open Heart Surgery;  Laterality: N/A;   VARICOSE VEIN SURGERY Left  Allergies  No Known Allergies    History of Present Illness      68 y.o. 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  He has a long history of poorly controlled hypertension as well as noncompliance with  medications.  In July 2022, he was admitted to [facility] regional with chest pain and elevated troponin.  EF was 60 to 65% by echo.  He initially left AMA but returned 4 days later with ongoing chest pain, fevers, chills, malaise, and was noted to be COVID-positive.  He underwent diagnostic catheterization revealing severe multivessel CAD as outlined in the past medical history.  He was subsequently referred to CT surgery in the outpatient setting, allowing for him to recover from COVID, and subsequently underwent CABG x 5 in August 2022 (LIMA  LAD, L radial  RI, VG  D1, VG  RPDA  RPL).  He was readmitted in late August 2022 for respiratory distress and pleural effusion requiring thoracentesis.  In October 2023, Miguel Fletcher was admitted with atypical chest pain in the setting of medication noncompliance.  Stress testing was undertaken and showed no evidence of ischemia or infarct, though EF was measured at 45%.  This was followed by an echocardiogram showed an EF of 60 to 65% with severe LVH, no regional wall motion abnormalities, and mild MR.  He was medically managed.  In July 2024, he was seen by podiatry with complaints of bilateral leg pain.  ABIs were notable for abnormality on the right at 0.84 (1.06 on the left).  He was seen by Miguel Fletcher and underwent abdominal aortogram with lower extremity angiography which showed no significant aortoiliac disease and moderate diffuse bilateral disease. He was placed on cilostazol  and medically managed.  PTA of the bilateral tibial arteries was felt to be best reserved for refractory symptoms.      Miguel Fletcher has had several office visits earlier this year in the setting of marked hypertension and noncompliance with medications.  He complained of intermittent exertional chest pain in February 2025 and a Lexiscan  PET/CT was ordered.  This was finally carried out earlier this month and showed mid to basal anterolateral and basal inferolateral reversible ischemia with  an EF of 35%.  EF was 37% with stress.  Over the past month, he says he has not been having any chest pain but continues to have dyspnea on exertion.  This is relatively unchanged.  His wife has been monitoring his medications and he reports better compliance.  Blood pressure is better today, though still elevated.  We discussed results of his Lexiscan  PET/CT.  He is interested in pursuing diagnostic catheterization.  He denies palpitations, PND, orthopnea, dizziness, syncope, edema, or early satiety.  He is bothered by chronic fungal infections of his feet in the setting of wearing cowboy boots without socks. Objective  Home Medications    Current Outpatient Medications  Medication Sig Dispense Refill   amLODipine  (NORVASC ) 10 MG tablet Take 1 tablet (10 mg total) by mouth daily. 90 tablet 3   aspirin  EC 81 MG tablet Take 1 tablet (81 mg total) by mouth daily. Swallow whole. 90 tablet 3   atorvastatin  (LIPITOR ) 80 MG tablet Take 1 tablet (80 mg total) by mouth daily at 8 pm. 90 tablet 3   cilostazol  (PLETAL ) 50 MG tablet Take 1 tablet (50 mg total) by mouth 2 (two) times daily. 60 tablet  1   cloNIDine  (CATAPRES ) 0.1 MG tablet Take 1 tablet (0.1 mg total) by mouth 2 (two) times daily. 60 tablet 11   clotrimazole -betamethasone  (LOTRISONE ) cream Apply 1 Application topically daily. 45 g 2   HYDROcodone -acetaminophen  (NORCO) 5-325 MG tablet Take 1 tablet by mouth every 4 (four) hours as needed for moderate pain (pain score 4-6). 6 tablet 0   isosorbide  mononitrate (IMDUR ) 30 MG 24 hr tablet Take 1 tablet (30 mg total) by mouth daily. 90 tablet 3   lisinopril  (ZESTRIL ) 40 MG tablet Take 1 tablet (40 mg total) by mouth daily. 90 tablet 3   nitroGLYCERIN  (NITROSTAT ) 0.4 MG SL tablet Place 1 tablet (0.4 mg total) under the tongue every 5 (five) minutes as needed for chest pain. 25 tablet 3   potassium chloride  SA (KLOR-CON  M) 20 MEQ tablet Take 1 tablet (20 mEq total) by mouth daily. 90 tablet 1    spironolactone  (ALDACTONE ) 25 MG tablet Take 1 tablet (25 mg total) by mouth daily. 90 tablet 1   terbinafine  (LAMISIL ) 250 MG tablet Take 1 tablet (250 mg total) by mouth daily. 30 tablet 0   No current facility-administered medications for this visit.    Family history    Family History  Problem Relation Age of Onset   Alzheimer's disease Father    Heart disease Father    CAD Neg Hx     Social history    Social History   Socioeconomic History   Marital status: Married    Spouse name: Not on file   Number of children: Not on file   Years of education: Not on file   Highest education level: Not on file  Occupational History   Not on file  Tobacco Use   Smoking status: Every Day    Current packs/day: 0.00    Average packs/day: 1 pack/day for 25.0 years (25.0 ttl pk-yrs)    Types: Cigarettes    Start date: 11/07/1995    Last attempt to quit: 11/06/2020    Years since quitting: 2.6    Passive exposure: Past   Smokeless tobacco: Never   Tobacco comments:    02/22/22 1 PPD  Vaping Use   Vaping status: Never Used  Substance and Sexual Activity   Alcohol use: No   Drug use: Not Currently    Comment: I QUIT ABOUT 20 YRS AGO   Sexual activity: Not on file  Other Topics Concern   Not on file  Social History Narrative   Not on file   Social Drivers of Health   Financial Resource Strain: Not on file  Food Insecurity: Patient Declined (12/22/2021)   Hunger Vital Sign    Worried About Running Out of Food in the Last Year: Patient declined    Barista in the Last Year: Patient declined  Transportation Needs: Patient Declined (12/22/2021)   PRAPARE - Administrator, Civil Service (Medical): Patient declined    Lack of Transportation (Non-Medical): Patient declined  Physical Activity: Not on file  Stress: Not on file  Social Connections: Not on file  Intimate Partner Violence: Patient Declined (12/22/2021)   Humiliation, Afraid, Rape, and Kick questionnaire     Fear of Current or Ex-Partner: Patient declined    Emotionally Abused: Patient declined    Physically Abused: Patient declined    Sexually Abused: Patient declined    Review of systems   ROS notable for h/o c/p but none in the past month.  He has chronic  dyspnea on exertion.  He has recurrent fungal infections on his feet in the setting of wearing, boots without socks.  He has chronic right shoulder pain.  Since having Bell's palsy several years ago, he has intermittent ptosis on the right.  He denies palpitations, PND, orthopnea, dizziness, syncope, edema, or early satiety.  All other systems reviewed and negative.  Physical Exam    VS:  BP (!) 160/80   Pulse 63   Ht 5\' 8"  (1.727 m)   Wt 201 lb 12.8 oz (91.5 kg)   SpO2 99%   BMI 30.68 kg/m  , BMI Body mass index is 30.68 kg/m.     Today's Vitals   07/14/23 0808 07/14/23 1012  BP: (!) 148/92 (!) 160/80  Pulse: 63   SpO2: 99%   Weight: 201 lb 12.8 oz (91.5 kg)   Height: 5\' 8"  (1.727 m)   PainSc: 0-No pain   PainLoc: Chest    Body mass index is 30.68 kg/m. s   GEN: Well nourished, well developed, in no acute distress. HEENT: normal. Neck: Supple, no JVD, carotid bruits, or masses. Cardiac: RRR, no murmurs, rubs, or gallops. No clubbing, cyanosis, edema.  Radials 2+/PT 2+ and equal bilaterally.  Respiratory:  Respirations regular and unlabored, scattered rhonchi. GI: Soft, nontender, nondistended, BS + x 4. MS: no deformity or atrophy. Skin: warm and dry, no rash. Neuro:  Strength and sensation are intact. Psych: Normal affect.  Accessory Clinical Findings    ECG personally reviewed by me today - EKG Interpretation Date/Time:  Friday July 14 2023 08:13:42 EDT Ventricular Rate:  64 PR Interval:  242 QRS Duration:  150 QT Interval:  450 QTC Calculation: 464 R Axis:   -49  Text Interpretation: Sinus rhythm with 1st degree A-V block with Premature atrial complexes in a pattern of bigeminy Left axis deviation Left  anterior fasicular block Left bundle branch block Confirmed by Laneta Pintos [phone]) on 07/14/2023 8:25:02 AM  - no acute changes.  Lab Results  Component Value Date   WBC 5.9 03/24/2023   HGB 13.2 03/24/2023   HCT 39.6 03/24/2023   MCV 93.4 03/24/2023   PLT 171 03/24/2023   Lab Results  Component Value Date   CREATININE 1.22 04/12/2023   BUN 20 04/12/2023   NA 143 04/12/2023   K 4.4 04/12/2023   CL 103 04/12/2023   CO2 25 04/12/2023   Lab Results  Component Value Date   ALT 12 12/21/2021   AST 21 12/21/2021   ALKPHOS 63 12/21/2021   BILITOT 1.0 12/21/2021   Lab Results  Component Value Date   CHOL 72 (L) 11/09/2020   HDL 21 (L) 11/09/2020   LDLCALC 33 11/09/2020   LDLDIRECT 30 11/09/2020   TRIG 87 11/09/2020   CHOLHDL 3.4 11/09/2020    Lab Results  Component Value Date   HGBA1C 5.3 10/26/2020       Assessment & Plan    1.  Unstable angina/CAD: Status post prior CABG x 5 in August 2022.  Since at least January 2025, he has been having intermittent resting exertional chest pain for which she was seen in the ED in January with mild troponin elevation.  Lexiscan  PET/CT was ordered back in February however, patient delayed scheduling until April and just had a few weeks ago.  This showed mid to basal anterolateral and basal inferolateral reversible ischemia with reduced EF-35% at rest and 37% with stress.  Study was felt to be intermediate risk.  Patient denies chest  pain today but does have chronic dyspnea on exertion which has been relatively stable.  We discussed the results of his stress test in the context of his prior history, ongoing risk factors, and symptoms.  We will arrange for diagnostic catheterization next week.  The patient understands that risks include but are not limited to stroke (1 in 1000), death (1 in 1000), kidney failure [usually temporary] (1 in 500), bleeding (1 in 200), allergic reaction [possibly serious] (1 in 200), and agrees to proceed.  CBC  and basic metabolic panel today.  He remains on aspirin , statin, and ACE inhibitor therapy.  I am adding long-acting nitrate as well as as needed nitrates to his regimen.  2.  Ischemic cardiomyopathy: Ejection fraction reduced by PET/CT at 35% at rest.  He has chronic dyspnea exertion.  He is euvolemic on examination.  I will arrange for a follow-up echocardiogram to confirm.  He is currently on ACE inhibitor and spironolactone  therapy.  Await echo and look to escalate GDMT at follow-up if LV dysfunction confirmed.  3.  Primary hypertension: Historically poorly controlled in the setting of noncompliance, though patient notes improved compliance over the past month.  Pressure remains elevated today though not nearly as bad as what was previously recorded.  Adding long-acting nitrate in the setting of abnormal stress test and pending catheterization.  In the setting of LV dysfunction, will look to hone his GDMT in the future.  4.  Tobacco abuse: Still smoking a pack a day, up from a half a pack a day at his last visit.  He is not currently contemplating quitting.  Complete cessation advised.  5.  Hypokalemia: He is on potassium supplementation as well as spironolactone .  Follow-up basic metabolic panel today.  6.  Peripheral arterial disease: Angiography in 2024 showed no significant aortoiliac disease and moderate diffuse bilateral lower extremity disease for which he is on cilostazol .  He denies any claudication today.  Continue aspirin  and statin as well.  7.  History of Bell's palsy: Chronically bothered by right-sided facial droop and ptosis.  Previously referred to neurology though he has not followed up.  8.  Right axillary mass: Seen by general surgery earlier this month.  Ultrasound correlated with enlarged lymph node.  Biopsy recommended.  He will follow-up with general surgery.  From a cardiac standpoint, we are pursuing diagnostic catheterization and thus any recommendations related to  preoperative evaluation are deferred until testing completed.  9.  Disposition: Follow-up CBC and basic metabolic panel.  Arrange for diagnostic catheterization next week.  Follow-up in clinic 2 weeks post catheterization.  Laneta Pintos, NP 07/14/2023, 10:14 AM

## 2023-07-14 NOTE — Patient Instructions (Signed)
 Medication Instructions:  START Imdur  30 mg once daily  Take Nitroglycerin  as needed for chest pain: Place one tablet under the tongue as needed every 5 minutes for chest pain. If you have to use three tablets with no relief, please call 911 or go to your closest Emergency Department.  *If you need a refill on your cardiac medications before your next appointment, please call your pharmacy*  Lab Work: Your provider would like for you to have the following labs today: CBC, BMET  If you have labs (blood work) drawn today and your tests are completely normal, you will receive your results only by: MyChart Message (if you have MyChart) OR A paper copy in the mail If you have any lab test that is abnormal or we need to change your treatment, we will call you to review the results.  Testing/Procedures: Your physician has requested that you have a cardiac catheterization. Cardiac catheterization is used to diagnose and/or treat various heart conditions. Doctors may recommend this procedure for a number of different reasons. The most common reason is to evaluate chest pain. Chest pain can be a symptom of coronary artery disease (CAD), and cardiac catheterization can show whether plaque is narrowing or blocking your heart's arteries. This procedure is also used to evaluate the valves, as well as measure the blood flow and oxygen levels in different parts of your heart. For further information please visit [email or web address] Please follow instruction sheet, as given.   Follow-Up: At [facility], you and your health needs are our priority.  As part of our continuing mission to provide you with exceptional heart care, our providers are all part of one team.  This team includes your primary Cardiologist (physician) and Advanced Practice Providers or APPs (Physician Assistants and Nurse Practitioners) who all work together to provide you with the care you need, when you need it.  Your next  appointment:   3 week(s)  Provider:   Belva Boyden, MD or Laneta Pintos, NP    We recommend signing up for the patient portal called "MyChart".  Sign up information is provided on this After Visit Summary.  MyChart is used to connect with patients for Virtual Visits (Telemedicine).  Patients are able to view lab/test results, encounter notes, upcoming appointments, etc.  Non-urgent messages can be sent to your provider as well.   To learn more about what you can do with MyChart, go to [email or web address].   Other Instructions  [facility] A DEPT OF [place]. [facility] AT [facility] [address] Miguel Fletcher [address] [place] [address] Dept: [phone] Loc: [phone]  Miguel Fletcher  07/14/2023  You are scheduled for a Cardiac Catheterization on Friday, May 2 with Dr. Veryl Gottron End.  1. Please arrive at the [facility] Entrance of [facility], [address], [place] [address] at 8:30 AM (This is 1.5 hour(s) prior to your procedure time).  Proceed to the Check-In Desk directly inside the entrance.  Procedure Parking: Use the entrance off of the [address] side of the hospital. Turn right upon entering and follow the driveway to parking that is directly in front of the [facility] Vascular Center. There is no valet parking available at this entrance, however there is an awning directly in front of the Heart & Vascular Center for drop off/ pick up for patients.  Special note: Every effort is made to have your procedure done on time. Please understand that emergencies sometimes  delay scheduled procedures.  2. Diet: Do not eat solid foods after midnight.  The patient may have clear liquids until 5am upon the day of the procedure.  3. Labs: You will need to have blood drawn on 07/14/23. You do not need to be fasting.  4. Medication instructions in preparation for your procedure: Hold the spironolactone  and  potassium the morning of the cath.  On the morning of your procedure, take your Aspirin  81 mg and any morning medicines NOT listed above.  You may use sips of water.  5. Plan to go home the same day, you will only stay overnight if medically necessary. 6. Bring a current list of your medications and current insurance cards. 7. You MUST have a responsible person to drive you home. 8. Someone MUST be with you the first 24 hours after you arrive home or your discharge will be delayed. 9. Please wear clothes that are easy to get on and off and wear slip-on shoes.  Thank you for allowing us  to care for you!   -- [place] [facility]

## 2023-07-14 NOTE — Progress Notes (Signed)
 Office Visit    Patient Name: Miguel Fletcher Date of Encounter: 07/14/2023  Primary Care Provider:  Patient, No Pcp Per Primary Cardiologist:  Belva Boyden, MD  Chief Complaint    68 y.o. 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 08/2020 MRI [facility]): Well-circumscribed T1 hypointense, T2 hyperintense enhancing mass within the right axilla measuring 3.1 x 2.6 x 3.0 cm.  Mass extends along the course of the axillary neurovascular structures.  No evidence for invasion into the adjacent musculature.  No enlarged axillary lymph nodes.   Bell's palsy    Biliary colic    CAD (coronary artery disease)    a. 09/2020 NSTEMI/Cath: LM 60-70d, LAD 70p, D2 60, RI 70p, LCX mild diff dzs, RCA 40-46m, RPDA 50, RPAV 50; b. 10/2020 CABG x 5 (LIMA->LAD, L radial->RI, VG->D1, VG->RPDA->RPL); c. 12/2021 MV: EF 45% (nl by echo), no isch/infart-->low risk; d. 06/2022 Lexi PET: mid-basal antlat and basal inflat rev ischemia. EF 35% @ rest, 37% w/ stress.   Diastolic dysfunction    a. 09/2020 Echo: EF 60-65%, no rwma, sev LVH. Gr1 DD. Nl RV size/fxn. Mod dil LA; b. 12/2021 Echo: EF 60-65%, no rwma, sev LVH, GrI DD, low-nl RV fxn, mildly dil LA, mild MR, Ao root 38mm.   Hemorrhoids    Hypercholesterolemia    Hypertension    LVH (left ventricular hypertrophy)    PAD (peripheral artery disease) (HCC)    Stroke (cerebrum) (HCC)    Tobacco use    Past Surgical History:  Procedure Laterality Date   ABDOMINAL AORTOGRAM W/LOWER EXTREMITY Bilateral 10/12/2022   Procedure: ABDOMINAL AORTOGRAM W/LOWER EXTREMITY;   Surgeon: Wenona Hamilton, MD;  Location: [facility] INVASIVE CV LAB;  Service: Cardiovascular;  Laterality: Bilateral;   CORONARY ARTERY BYPASS GRAFT N/A 10/28/2020   Procedure: CORONARY ARTERY BYPASS GRAFTING (CABG), ON PUMP, TIMES FIVE, USING LEFT INTERNAL MAMMARY ARTERY, LEFT RADIAL ARTERY AND ENDOSCOPICALLY HARVESTED RIGHT GREATER SAPHENOUS VEIN;  Surgeon: Zelphia Higashi, MD;  Location: [facility] OR;  Service: Open Heart Surgery;  Laterality: N/A;   CORONARY ULTRASOUND/IVUS N/A 10/06/2020   Procedure: Intravascular Ultrasound/IVUS;  Surgeon: Arnoldo Lapping, MD;  Location: [facility] INVASIVE CV LAB;  Service: Cardiovascular;  Laterality: N/A;   ENDOVEIN HARVEST OF GREATER SAPHENOUS VEIN Right 10/28/2020   Procedure: ENDOVEIN HARVEST OF RIGHT GREATER SAPHENOUS VEIN;  Surgeon: Zelphia Higashi, MD;  Location: [facility] OR;  Service: Open Heart Surgery;  Laterality: Right;   IR THORACENTESIS ASP PLEURAL SPACE W/IMG GUIDE  11/12/2020   LEFT HEART CATH AND CORONARY ANGIOGRAPHY N/A 10/06/2020   Procedure: LEFT HEART CATH AND CORONARY ANGIOGRAPHY;  Surgeon: Arnoldo Lapping, MD;  Location: [facility] INVASIVE CV LAB;  Service: Cardiovascular;  Laterality: N/A;   RADIAL ARTERY HARVEST Left 10/28/2020   Procedure: LEFT RADIAL ARTERY HARVEST;  Surgeon: Zelphia Higashi, MD;  Location: [facility] OR;  Service: Open Heart Surgery;  Laterality: Left;   TEE WITHOUT CARDIOVERSION N/A 10/28/2020   Procedure: TRANSESOPHAGEAL ECHOCARDIOGRAM (TEE);  Surgeon: Zelphia Higashi, MD;  Location: [facility] OR;  Service: Open Heart Surgery;  Laterality: N/A;   VARICOSE VEIN SURGERY Left  Allergies  No Known Allergies    History of Present Illness      68 y.o. 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  He has a long history of poorly controlled hypertension as well as noncompliance with  medications.  In July 2022, he was admitted to [facility] regional with chest pain and elevated troponin.  EF was 60 to 65% by echo.  He initially left AMA but returned 4 days later with ongoing chest pain, fevers, chills, malaise, and was noted to be COVID-positive.  He underwent diagnostic catheterization revealing severe multivessel CAD as outlined in the past medical history.  He was subsequently referred to CT surgery in the outpatient setting, allowing for him to recover from COVID, and subsequently underwent CABG x 5 in August 2022 (LIMA  LAD, L radial  RI, VG  D1, VG  RPDA  RPL).  He was readmitted in late August 2022 for respiratory distress and pleural effusion requiring thoracentesis.  In October 2023, Miguel Fletcher was admitted with atypical chest pain in the setting of medication noncompliance.  Stress testing was undertaken and showed no evidence of ischemia or infarct, though EF was measured at 45%.  This was followed by an echocardiogram showed an EF of 60 to 65% with severe LVH, no regional wall motion abnormalities, and mild MR.  He was medically managed.  In July 2024, he was seen by podiatry with complaints of bilateral leg pain.  ABIs were notable for abnormality on the right at 0.84 (1.06 on the left).  He was seen by Dr. Alvenia Aus and underwent abdominal aortogram with lower extremity angiography which showed no significant aortoiliac disease and moderate diffuse bilateral disease. He was placed on cilostazol  and medically managed.  PTA of the bilateral tibial arteries was felt to be best reserved for refractory symptoms.      Miguel Fletcher has had several office visits earlier this year in the setting of marked hypertension and noncompliance with medications.  He complained of intermittent exertional chest pain in February 2025 and a Lexiscan  PET/CT was ordered.  This was finally carried out earlier this month and showed mid to basal anterolateral and basal inferolateral reversible ischemia with  an EF of 35%.  EF was 37% with stress.  Over the past month, he says he has not been having any chest pain but continues to have dyspnea on exertion.  This is relatively unchanged.  His wife has been monitoring his medications and he reports better compliance.  Blood pressure is better today, though still elevated.  We discussed results of his Lexiscan  PET/CT.  He is interested in pursuing diagnostic catheterization.  He denies palpitations, PND, orthopnea, dizziness, syncope, edema, or early satiety.  He is bothered by chronic fungal infections of his feet in the setting of wearing cowboy boots without socks. Objective  Home Medications    Current Outpatient Medications  Medication Sig Dispense Refill   amLODipine  (NORVASC ) 10 MG tablet Take 1 tablet (10 mg total) by mouth daily. 90 tablet 3   aspirin  EC 81 MG tablet Take 1 tablet (81 mg total) by mouth daily. Swallow whole. 90 tablet 3   atorvastatin  (LIPITOR ) 80 MG tablet Take 1 tablet (80 mg total) by mouth daily at 8 pm. 90 tablet 3   cilostazol  (PLETAL ) 50 MG tablet Take 1 tablet (50 mg total) by mouth 2 (two) times daily. 60 tablet  1   cloNIDine  (CATAPRES ) 0.1 MG tablet Take 1 tablet (0.1 mg total) by mouth 2 (two) times daily. 60 tablet 11   clotrimazole -betamethasone  (LOTRISONE ) cream Apply 1 Application topically daily. 45 g 2   HYDROcodone -acetaminophen  (NORCO) 5-325 MG tablet Take 1 tablet by mouth every 4 (four) hours as needed for moderate pain (pain score 4-6). 6 tablet 0   isosorbide  mononitrate (IMDUR ) 30 MG 24 hr tablet Take 1 tablet (30 mg total) by mouth daily. 90 tablet 3   lisinopril  (ZESTRIL ) 40 MG tablet Take 1 tablet (40 mg total) by mouth daily. 90 tablet 3   nitroGLYCERIN  (NITROSTAT ) 0.4 MG SL tablet Place 1 tablet (0.4 mg total) under the tongue every 5 (five) minutes as needed for chest pain. 25 tablet 3   potassium chloride  SA (KLOR-CON  M) 20 MEQ tablet Take 1 tablet (20 mEq total) by mouth daily. 90 tablet 1    spironolactone  (ALDACTONE ) 25 MG tablet Take 1 tablet (25 mg total) by mouth daily. 90 tablet 1   terbinafine  (LAMISIL ) 250 MG tablet Take 1 tablet (250 mg total) by mouth daily. 30 tablet 0   No current facility-administered medications for this visit.    Family history    Family History  Problem Relation Age of Onset   Alzheimer's disease Father    Heart disease Father    CAD Neg Hx     Social history    Social History   Socioeconomic History   Marital status: Married    Spouse name: Not on file   Number of children: Not on file   Years of education: Not on file   Highest education level: Not on file  Occupational History   Not on file  Tobacco Use   Smoking status: Every Day    Current packs/day: 0.00    Average packs/day: 1 pack/day for 25.0 years (25.0 ttl pk-yrs)    Types: Cigarettes    Start date: 11/07/1995    Last attempt to quit: 11/06/2020    Years since quitting: 2.6    Passive exposure: Past   Smokeless tobacco: Never   Tobacco comments:    02/22/22 1 PPD  Vaping Use   Vaping status: Never Used  Substance and Sexual Activity   Alcohol use: No   Drug use: Not Currently    Comment: I QUIT ABOUT 20 YRS AGO   Sexual activity: Not on file  Other Topics Concern   Not on file  Social History Narrative   Not on file   Social Drivers of Health   Financial Resource Strain: Not on file  Food Insecurity: Patient Declined (12/22/2021)   Hunger Vital Sign    Worried About Running Out of Food in the Last Year: Patient declined    Barista in the Last Year: Patient declined  Transportation Needs: Patient Declined (12/22/2021)   PRAPARE - Administrator, Civil Service (Medical): Patient declined    Lack of Transportation (Non-Medical): Patient declined  Physical Activity: Not on file  Stress: Not on file  Social Connections: Not on file  Intimate Partner Violence: Patient Declined (12/22/2021)   Humiliation, Afraid, Rape, and Kick questionnaire     Fear of Current or Ex-Partner: Patient declined    Emotionally Abused: Patient declined    Physically Abused: Patient declined    Sexually Abused: Patient declined    Review of systems   ROS notable for h/o c/p but none in the past month.  He has chronic  dyspnea on exertion.  He has recurrent fungal infections on his feet in the setting of wearing, boots without socks.  He has chronic right shoulder pain.  Since having Bell's palsy several years ago, he has intermittent ptosis on the right.  He denies palpitations, PND, orthopnea, dizziness, syncope, edema, or early satiety.  All other systems reviewed and negative.  Physical Exam    VS:  BP (!) 160/80   Pulse 63   Ht 5\' 8"  (1.727 m)   Wt 201 lb 12.8 oz (91.5 kg)   SpO2 99%   BMI 30.68 kg/m  , BMI Body mass index is 30.68 kg/m.     Today's Vitals   07/14/23 0808 07/14/23 1012  BP: (!) 148/92 (!) 160/80  Pulse: 63   SpO2: 99%   Weight: 201 lb 12.8 oz (91.5 kg)   Height: 5\' 8"  (1.727 m)   PainSc: 0-No pain   PainLoc: Chest    Body mass index is 30.68 kg/m. s   GEN: Well nourished, well developed, in no acute distress. HEENT: normal. Neck: Supple, no JVD, carotid bruits, or masses. Cardiac: RRR, no murmurs, rubs, or gallops. No clubbing, cyanosis, edema.  Radials 2+/PT 2+ and equal bilaterally.  Respiratory:  Respirations regular and unlabored, scattered rhonchi. GI: Soft, nontender, nondistended, BS + x 4. MS: no deformity or atrophy. Skin: warm and dry, no rash. Neuro:  Strength and sensation are intact. Psych: Normal affect.  Accessory Clinical Findings    ECG personally reviewed by me today - EKG Interpretation Date/Time:  Friday July 14 2023 08:13:42 EDT Ventricular Rate:  64 PR Interval:  242 QRS Duration:  150 QT Interval:  450 QTC Calculation: 464 R Axis:   -49  Text Interpretation: Sinus rhythm with 1st degree A-V block with Premature atrial complexes in a pattern of bigeminy Left axis deviation Left  anterior fasicular block Left bundle branch block Confirmed by Laneta Pintos [phone]) on 07/14/2023 8:25:02 AM  - no acute changes.  Lab Results  Component Value Date   WBC 5.9 03/24/2023   HGB 13.2 03/24/2023   HCT 39.6 03/24/2023   MCV 93.4 03/24/2023   PLT 171 03/24/2023   Lab Results  Component Value Date   CREATININE 1.22 04/12/2023   BUN 20 04/12/2023   NA 143 04/12/2023   K 4.4 04/12/2023   CL 103 04/12/2023   CO2 25 04/12/2023   Lab Results  Component Value Date   ALT 12 12/21/2021   AST 21 12/21/2021   ALKPHOS 63 12/21/2021   BILITOT 1.0 12/21/2021   Lab Results  Component Value Date   CHOL 72 (L) 11/09/2020   HDL 21 (L) 11/09/2020   LDLCALC 33 11/09/2020   LDLDIRECT 30 11/09/2020   TRIG 87 11/09/2020   CHOLHDL 3.4 11/09/2020    Lab Results  Component Value Date   HGBA1C 5.3 10/26/2020       Assessment & Plan    1.  Unstable angina/CAD: Status post prior CABG x 5 in August 2022.  Since at least January 2025, he has been having intermittent resting exertional chest pain for which she was seen in the ED in January with mild troponin elevation.  Lexiscan  PET/CT was ordered back in February however, patient delayed scheduling until April and just had a few weeks ago.  This showed mid to basal anterolateral and basal inferolateral reversible ischemia with reduced EF-35% at rest and 37% with stress.  Study was felt to be intermediate risk.  Patient denies chest  pain today but does have chronic dyspnea on exertion which has been relatively stable.  We discussed the results of his stress test in the context of his prior history, ongoing risk factors, and symptoms.  We will arrange for diagnostic catheterization next week.  The patient understands that risks include but are not limited to stroke (1 in 1000), death (1 in 1000), kidney failure [usually temporary] (1 in 500), bleeding (1 in 200), allergic reaction [possibly serious] (1 in 200), and agrees to proceed.  CBC  and basic metabolic panel today.  He remains on aspirin , statin, and ACE inhibitor therapy.  I am adding long-acting nitrate as well as as needed nitrates to his regimen.  2.  Ischemic cardiomyopathy: Ejection fraction reduced by PET/CT at 35% at rest.  He has chronic dyspnea exertion.  He is euvolemic on examination.  I will arrange for a follow-up echocardiogram to confirm.  He is currently on ACE inhibitor and spironolactone  therapy.  Await echo and look to escalate GDMT at follow-up if LV dysfunction confirmed.  3.  Primary hypertension: Historically poorly controlled in the setting of noncompliance, though patient notes improved compliance over the past month.  Pressure remains elevated today though not nearly as bad as what was previously recorded.  Adding long-acting nitrate in the setting of abnormal stress test and pending catheterization.  In the setting of LV dysfunction, will look to hone his GDMT in the future.  4.  Tobacco abuse: Still smoking a pack a day, up from a half a pack a day at his last visit.  He is not currently contemplating quitting.  Complete cessation advised.  5.  Hypokalemia: He is on potassium supplementation as well as spironolactone .  Follow-up basic metabolic panel today.  6.  Peripheral arterial disease: Angiography in 2024 showed no significant aortoiliac disease and moderate diffuse bilateral lower extremity disease for which he is on cilostazol .  He denies any claudication today.  Continue aspirin  and statin as well.  7.  History of Bell's palsy: Chronically bothered by right-sided facial droop and ptosis.  Previously referred to neurology though he has not followed up.  8.  Right axillary mass: Seen by general surgery earlier this month.  Ultrasound correlated with enlarged lymph node.  Biopsy recommended.  He will follow-up with general surgery.  From a cardiac standpoint, we are pursuing diagnostic catheterization and thus any recommendations related to  preoperative evaluation are deferred until testing completed.  9.  Disposition: Follow-up CBC and basic metabolic panel.  Arrange for diagnostic catheterization next week.  Follow-up in clinic 2 weeks post catheterization.  Laneta Pintos, NP 07/14/2023, 10:14 AM

## 2023-07-15 LAB — BASIC METABOLIC PANEL WITH GFR
BUN/Creatinine Ratio: 16 (ref 10–24)
BUN: 19 mg/dL (ref 8–27)
CO2: 23 mmol/L (ref 20–29)
Calcium: 9.3 mg/dL (ref 8.6–10.2)
Chloride: 104 mmol/L (ref 96–106)
Creatinine, Ser: 1.2 mg/dL (ref 0.76–1.27)
Glucose: 90 mg/dL (ref 70–99)
Potassium: 4.1 mmol/L (ref 3.5–5.2)
Sodium: 141 mmol/L (ref 134–144)
eGFR: 67 mL/min/{1.73_m2} (ref >59)

## 2023-07-15 LAB — CBC
Hematocrit: 43.9 % (ref 37.5–51.0)
Hemoglobin: 14.8 g/dL (ref 13.0–17.7)
MCH: 31.2 pg (ref 26.6–33.0)
MCHC: 33.7 g/dL (ref 31.5–35.7)
MCV: 93 fL (ref 79–97)
Platelets: 179 10*3/uL (ref 150–450)
RBC: 4.74 x10E6/uL (ref 4.14–5.80)
RDW: 12.2 % (ref 11.6–15.4)
WBC: 5.6 10*3/uL (ref 3.4–10.8)

## 2023-07-17 NOTE — Progress Notes (Signed)
 Miguel Cord, MD sent to Miguel Fletcher Approved for US  guided core biopsy of right axillary mass. Local only, no sedation.  HKM

## 2023-07-21 ENCOUNTER — Other Ambulatory Visit: Payer: Self-pay

## 2023-07-21 ENCOUNTER — Ambulatory Visit
Admission: RE | Admit: 2023-07-21 | Discharge: 2023-07-21 | Disposition: A | Discharge status: Home / Self Care | Attending: Internal Medicine | Admitting: Internal Medicine

## 2023-07-21 ENCOUNTER — Encounter: Payer: Self-pay | Admitting: Internal Medicine

## 2023-07-21 ENCOUNTER — Encounter
Admission: RE | Disposition: A | Discharge status: Home / Self Care | Payer: Self-pay | Source: Home / Self Care | Attending: Internal Medicine

## 2023-07-21 DIAGNOSIS — Z8673 Personal history of transient ischemic attack (TIA), and cerebral infarction without residual deficits: Secondary | ICD-10-CM | POA: Diagnosis not present

## 2023-07-21 DIAGNOSIS — I739 Peripheral vascular disease, unspecified: Secondary | ICD-10-CM | POA: Diagnosis not present

## 2023-07-21 DIAGNOSIS — F17210 Nicotine dependence, cigarettes, uncomplicated: Secondary | ICD-10-CM | POA: Insufficient documentation

## 2023-07-21 DIAGNOSIS — E785 Hyperlipidemia, unspecified: Secondary | ICD-10-CM | POA: Diagnosis not present

## 2023-07-21 DIAGNOSIS — I10 Essential (primary) hypertension: Secondary | ICD-10-CM | POA: Insufficient documentation

## 2023-07-21 DIAGNOSIS — Z7982 Long term (current) use of aspirin: Secondary | ICD-10-CM | POA: Insufficient documentation

## 2023-07-21 DIAGNOSIS — G510 Bell's palsy: Secondary | ICD-10-CM | POA: Diagnosis not present

## 2023-07-21 DIAGNOSIS — I25118 Atherosclerotic heart disease of native coronary artery with other forms of angina pectoris: Secondary | ICD-10-CM | POA: Insufficient documentation

## 2023-07-21 DIAGNOSIS — R9439 Abnormal result of other cardiovascular function study: Secondary | ICD-10-CM | POA: Diagnosis present

## 2023-07-21 DIAGNOSIS — Z79899 Other long term (current) drug therapy: Secondary | ICD-10-CM | POA: Insufficient documentation

## 2023-07-21 DIAGNOSIS — Z951 Presence of aortocoronary bypass graft: Secondary | ICD-10-CM | POA: Insufficient documentation

## 2023-07-21 DIAGNOSIS — I255 Ischemic cardiomyopathy: Secondary | ICD-10-CM | POA: Diagnosis not present

## 2023-07-21 SURGERY — LEFT HEART CATH AND CORS/GRAFTS ANGIOGRAPHY
Anesthesia: Moderate Sedation | Laterality: Left

## 2023-07-21 MED ORDER — IOHEXOL 300 MG/ML  SOLN
INTRAMUSCULAR | Status: DC | PRN
  Administered 2023-07-21: 116 mL

## 2023-07-21 MED ORDER — HYDRALAZINE HCL 20 MG/ML IJ SOLN
INTRAMUSCULAR | Status: AC
  Filled 2023-07-21: qty 1

## 2023-07-21 MED ORDER — ASPIRIN 81 MG PO CHEW
CHEWABLE_TABLET | ORAL | Status: AC
  Filled 2023-07-21: qty 1

## 2023-07-21 MED ORDER — HYDRALAZINE HCL 20 MG/ML IJ SOLN
10.0000 mg | INTRAMUSCULAR | Status: DC | PRN
  Administered 2023-07-21 (×2): 10 mg via INTRAVENOUS

## 2023-07-21 MED ORDER — SODIUM CHLORIDE 0.9% FLUSH: 3.0000 mL | INTRAVENOUS | Status: DC | PRN

## 2023-07-21 MED ORDER — LIDOCAINE HCL (PF) 1 % IJ SOLN
INTRAMUSCULAR | Status: DC | PRN
  Administered 2023-07-21: 2 mL

## 2023-07-21 MED ORDER — LIDOCAINE HCL 1 % IJ SOLN
INTRAMUSCULAR | Status: AC
  Filled 2023-07-21: qty 20

## 2023-07-21 MED ORDER — SODIUM CHLORIDE 0.9 % IV SOLN: INTRAVENOUS | Status: DC

## 2023-07-21 MED ORDER — FENTANYL CITRATE (PF) 100 MCG/2ML IJ SOLN
INTRAMUSCULAR | Status: DC | PRN
  Administered 2023-07-21: 50 ug via INTRAVENOUS

## 2023-07-21 MED ORDER — ASPIRIN 81 MG PO CHEW
81.0000 mg | CHEWABLE_TABLET | ORAL | Status: AC
Start: 2023-07-22 — End: 2023-07-21
  Administered 2023-07-21: 81 mg via ORAL

## 2023-07-21 MED ORDER — ONDANSETRON HCL 4 MG/2ML IJ SOLN: 4.0000 mg | Freq: Four times a day (QID) | INTRAMUSCULAR | Status: DC | PRN

## 2023-07-21 MED ORDER — ACETAMINOPHEN 325 MG PO TABS: 650.0000 mg | ORAL_TABLET | ORAL | Status: DC | PRN

## 2023-07-21 MED ORDER — SODIUM CHLORIDE 0.9% FLUSH: 3.0000 mL | Freq: Two times a day (BID) | INTRAVENOUS | Status: DC

## 2023-07-21 MED ORDER — SODIUM CHLORIDE 0.9 % IV SOLN: 250.0000 mL | INTRAVENOUS | Status: DC | PRN

## 2023-07-21 MED ORDER — FENTANYL CITRATE (PF) 100 MCG/2ML IJ SOLN
INTRAMUSCULAR | Status: AC
  Filled 2023-07-21: qty 2

## 2023-07-21 MED ORDER — HEPARIN (PORCINE) IN NACL 1000-0.9 UT/500ML-% IV SOLN
INTRAVENOUS | Status: DC | PRN
  Administered 2023-07-21: 1000 mL

## 2023-07-21 MED ORDER — LABETALOL HCL 5 MG/ML IV SOLN: 10.0000 mg | INTRAVENOUS | Status: DC | PRN

## 2023-07-21 MED ORDER — MIDAZOLAM HCL 2 MG/2ML IJ SOLN
INTRAMUSCULAR | Status: AC
  Filled 2023-07-21: qty 2

## 2023-07-21 MED ORDER — MIDAZOLAM HCL 2 MG/2ML IJ SOLN
INTRAMUSCULAR | Status: DC | PRN
  Administered 2023-07-21: 1 mg via INTRAVENOUS

## 2023-07-21 MED ORDER — HEPARIN (PORCINE) IN NACL 1000-0.9 UT/500ML-% IV SOLN
INTRAVENOUS | Status: AC
  Filled 2023-07-21: qty 1000

## 2023-07-21 SURGICAL SUPPLY — 9 items
CATH INFINITI 5 FR IM (CATHETERS) IMPLANT
CATH INFINITI 5 FR MPA2 (CATHETERS) IMPLANT
CATH INFINITI 5FR MULTPACK ANG (CATHETERS) IMPLANT
DEVICE CLOSURE MYNXGRIP 5F (Vascular Products) IMPLANT
PACK CARDIAC CATH (CUSTOM PROCEDURE TRAY) ×1 IMPLANT
SET ATX-X65L (MISCELLANEOUS) IMPLANT
SHEATH AVANTI 5FR X 11CM (SHEATH) IMPLANT
STATION PROTECTION PRESSURIZED (MISCELLANEOUS) IMPLANT
WIRE GUIDERIGHT .035X150 (WIRE) IMPLANT

## 2023-07-21 NOTE — Progress Notes (Signed)
 Patient for US  guided Core axillary mass biopsy on Monday 07/24/23, I called and asked Erin, RN in Specials to relay instructions to the patient while he was here for another procedure on Friday 07/21/23. Instructions that he can eat and drink and to arrive at 12:30p for a 1p procedure. Erin, RN in Special Procedures assured me that she would give him the instructions. 07/21/23

## 2023-07-21 NOTE — Interval H&P Note (Signed)
 History and Physical Interval Note:  07/21/2023 9:38 AM  Miguel Fletcher  has presented today for surgery, with the diagnosis of atypical chest pain and abnormal stress test.  The various methods of treatment have been discussed with the patient and family. After consideration of risks, benefits and other options for treatment, the patient has consented to  Procedure(s): LEFT HEART CATH AND CORS/GRAFTS ANGIOGRAPHY (Left) as a surgical intervention.  The patient's history has been reviewed, patient examined, no change in status, stable for surgery.  I have reviewed the patient's chart and labs.  Questions were answered to the patient's satisfaction.    Cath Lab Visit (complete for each Cath Lab visit)  Clinical Evaluation Leading to the Procedure:   ACS: No.  Non-ACS:    Anginal Classification: CCS IV  Anti-ischemic medical therapy: Maximal Therapy (2 or more classes of medications)  Non-Invasive Test Results: Intermediate-risk stress test findings: cardiac mortality 1-3%/year  Prior CABG: Previous CABG  Miguel Fletcher

## 2023-07-24 ENCOUNTER — Ambulatory Visit
Admission: RE | Admit: 2023-07-24 | Discharge: 2023-07-24 | Disposition: A | Discharge status: Home / Self Care | Source: Ambulatory Visit | Attending: Surgery | Admitting: Surgery

## 2023-07-24 DIAGNOSIS — D3610 Benign neoplasm of peripheral nerves and autonomic nervous system, unspecified: Secondary | ICD-10-CM | POA: Diagnosis not present

## 2023-07-24 DIAGNOSIS — R2231 Localized swelling, mass and lump, right upper limb: Secondary | ICD-10-CM | POA: Insufficient documentation

## 2023-07-24 DIAGNOSIS — R222 Localized swelling, mass and lump, trunk: Secondary | ICD-10-CM | POA: Insufficient documentation

## 2023-07-24 MED ORDER — LIDOCAINE HCL (PF) 1 % IJ SOLN
4.0000 mL | Freq: Once | INTRAMUSCULAR | Status: AC
  Administered 2023-07-24: 4 mL via INTRADERMAL
  Filled 2023-07-24: qty 4

## 2023-07-24 NOTE — Procedures (Signed)
 Interventional Radiology Procedure Note  Procedure: US  guided core biopsy of right axillary mass  Complications: None  Estimated Blood Loss: None  Recommendations: - DC home    Signed,  Roxie Cord, MD

## 2023-07-26 ENCOUNTER — Other Ambulatory Visit: Payer: Self-pay

## 2023-07-26 DIAGNOSIS — D3610 Benign neoplasm of peripheral nerves and autonomic nervous system, unspecified: Secondary | ICD-10-CM

## 2023-07-26 DIAGNOSIS — R2231 Localized swelling, mass and lump, right upper limb: Secondary | ICD-10-CM

## 2023-07-26 LAB — SURGICAL PATHOLOGY

## 2023-07-26 NOTE — Progress Notes (Signed)
 07/26/23 Called patient twice to inform of the pathology results, but call would go straight to voicemail.  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  Patient will be referred to Dr. Henderson Lock.  Emmalene Hare, MD

## 2023-07-31 ENCOUNTER — Ambulatory Visit (INDEPENDENT_AMBULATORY_CARE_PROVIDER_SITE_OTHER): Admitting: Neurosurgery

## 2023-07-31 ENCOUNTER — Encounter: Payer: Self-pay | Admitting: Neurosurgery

## 2023-07-31 VITALS — BP 136/84 | Ht 68.0 in | Wt 205.0 lb

## 2023-07-31 DIAGNOSIS — G510 Bell's palsy: Secondary | ICD-10-CM | POA: Insufficient documentation

## 2023-07-31 DIAGNOSIS — D3610 Benign neoplasm of peripheral nerves and autonomic nervous system, unspecified: Secondary | ICD-10-CM | POA: Diagnosis not present

## 2023-07-31 DIAGNOSIS — R2231 Localized swelling, mass and lump, right upper limb: Secondary | ICD-10-CM

## 2023-07-31 NOTE — Progress Notes (Signed)
 Referring Physician:  Emmalene Hare, MD [address] [place],  [place] [address]  Primary Physician:  Patient, No Pcp Per  History of Present Illness: 07/31/2023 Miguel Fletcher is here today with a chief complaint of right-sided axillary mass, he is known about this for approximately 3 or 4 years he could always feel it in his armpit.  He was having some arm pain prior also gets some pain rating up into his neck as well.  He has a history of CVA, TIA, heart attack, and right upper extremity pain.  With his right upper extremity pain he had this mass worked up which involved a needle biopsy, during the biopsy he felt severe pain going down his arm and has had tingling and paresthesias ever since.  He denies any large birthmarks, does not have any family history of neurofibromatosis.  Denies any lipomas.  Denies any new weakness.  The symptoms are causing a significant impact on the patient's life.   I have utilized the care everywhere function in epic to review the outside records available from external health systems.  Review of Systems:  A 10 point review of systems is negative, except for the pertinent positives and negatives detailed in the HPI.  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 08/2020 MRI [facility]): Well-circumscribed T1 hypointense, T2 hyperintense enhancing mass within the right axilla measuring 3.1 x 2.6 x 3.0 cm.  Mass extends along the course of the axillary neurovascular structures.  No evidence for invasion into the adjacent musculature.  No enlarged axillary lymph nodes.   Bell's palsy    Biliary colic    CAD (coronary artery disease)    a. 09/2020 NSTEMI/Cath: LM 60-70d, LAD 70p, D2 60, RI 70p, LCX mild diff dzs, RCA 40-43m, RPDA 50, RPAV 50; b. 10/2020 CABG x 5 (LIMA->LAD, L radial->RI, VG->D1, VG->RPDA->RPL); c. 12/2021 MV: EF 45% (nl by echo), no isch/infart-->low risk; d. 06/2022  Lexi PET: mid-basal antlat and basal inflat rev ischemia. EF 35% @ rest, 37% w/ stress.   Diastolic dysfunction    a. 09/2020 Echo: EF 60-65%, no rwma, sev LVH. Gr1 DD. Nl RV size/fxn. Mod dil LA; b. 12/2021 Echo: EF 60-65%, no rwma, sev LVH, GrI DD, low-nl RV fxn, mildly dil LA, mild MR, Ao root 38mm.   Hemorrhoids    Hypercholesterolemia    Hypertension    LVH (left ventricular hypertrophy)    PAD (peripheral artery disease) (HCC)    Stroke (cerebrum) (HCC)    Tobacco use     Past Surgical History: Past Surgical History:  Procedure Laterality Date   ABDOMINAL AORTOGRAM W/LOWER EXTREMITY Bilateral 10/12/2022   Procedure: ABDOMINAL AORTOGRAM W/LOWER EXTREMITY;  Surgeon: Wenona Hamilton, MD;  Location: [facility] INVASIVE CV LAB;  Service: Cardiovascular;  Laterality: Bilateral;   CORONARY ARTERY BYPASS GRAFT N/A 10/28/2020   Procedure: CORONARY ARTERY BYPASS GRAFTING (CABG), ON PUMP, TIMES FIVE, USING LEFT INTERNAL MAMMARY ARTERY, LEFT RADIAL ARTERY AND ENDOSCOPICALLY HARVESTED RIGHT GREATER SAPHENOUS VEIN;  Surgeon: Zelphia Higashi, MD;  Location: [facility] OR;  Service: Open Heart Surgery;  Laterality: N/A;   CORONARY ULTRASOUND/IVUS N/A 10/06/2020   Procedure: Intravascular Ultrasound/IVUS;  Surgeon: Arnoldo Lapping, MD;  Location[facility] INVASIVE CV LAB;  Service: Cardiovascular;  Laterality: N/A;   ENDOVEIN HARVEST OF GREATER SAPHENOUS VEIN Right 10/28/2020   Procedure: ENDOVEIN HARVEST OF RIGHT GREATER SAPHENOUS VEIN;  Surgeon: Zelphia Higashi, MD;  Location: [facility] OR;  Service: Open Heart Surgery;  Laterality: Right;   IR THORACENTESIS ASP PLEURAL SPACE W/IMG GUIDE  11/12/2020   LEFT HEART CATH AND CORONARY ANGIOGRAPHY N/A 10/06/2020   Procedure: LEFT HEART CATH AND CORONARY ANGIOGRAPHY;  Surgeon: Arnoldo Lapping, MD;  Location: [facility] INVASIVE CV LAB;  Service: Cardiovascular;  Laterality: N/A;   LEFT HEART CATH AND CORS/GRAFTS ANGIOGRAPHY Left 07/21/2023   Procedure: LEFT HEART CATH AND  CORS/GRAFTS ANGIOGRAPHY;  Surgeon: Sammy Crisp, MD;  Location: [facility] INVASIVE CV LAB;  Service: Cardiovascular;  Laterality: Left;   RADIAL ARTERY HARVEST Left 10/28/2020   Procedure: LEFT RADIAL ARTERY HARVEST;  Surgeon: Zelphia Higashi, MD;  Location: [facility] OR;  Service: Open Heart Surgery;  Laterality: Left;   TEE WITHOUT CARDIOVERSION N/A 10/28/2020   Procedure: TRANSESOPHAGEAL ECHOCARDIOGRAM (TEE);  Surgeon: Zelphia Higashi, MD;  Location: [facility] OR;  Service: Open Heart Surgery;  Laterality: N/A;   VARICOSE VEIN SURGERY Left     Allergies: Allergies as of 07/31/2023   (No Known Allergies)    Medications:  Current Outpatient Medications:    acetaminophen  (TYLENOL ) 500 MG tablet, Take 1,000 mg by mouth every evening., Disp: , Rfl:    amLODipine  (NORVASC ) 10 MG tablet, Take 1 tablet (10 mg total) by mouth daily., Disp: 90 tablet, Rfl: 3   aspirin  EC 81 MG tablet, Take 1 tablet (81 mg total) by mouth daily. Swallow whole., Disp: 90 tablet, Rfl: 3   atorvastatin  (LIPITOR ) 80 MG tablet, Take 1 tablet (80 mg total) by mouth daily at 8 pm., Disp: 90 tablet, Rfl: 3   cilostazol  (PLETAL ) 50 MG tablet, Take 1 tablet (50 mg total) by mouth 2 (two) times daily., Disp: 60 tablet, Rfl: 1   hydrocortisone cream 1 %, Apply 1 Application topically at bedtime., Disp: , Rfl:    isosorbide  mononitrate (IMDUR ) 30 MG 24 hr tablet, Take 1 tablet (30 mg total) by mouth daily. (Patient taking differently: Take 30 mg by mouth at bedtime.), Disp: 90 tablet, Rfl: 3   lisinopril  (ZESTRIL ) 40 MG tablet, Take 1 tablet (40 mg total) by mouth daily. (Patient taking differently: Take 40 mg by mouth at bedtime.), Disp: 90 tablet, Rfl: 3   nitroGLYCERIN  (NITROSTAT ) 0.4 MG SL tablet, Place 1 tablet (0.4 mg total) under the tongue every 5 (five) minutes as needed for chest pain., Disp: 25 tablet, Rfl: 3   spironolactone  (ALDACTONE ) 25 MG tablet, Take 1 tablet (25 mg total) by mouth daily. (Patient taking differently:  Take 25 mg by mouth at bedtime.), Disp: 90 tablet, Rfl: 1  Social History: Social History   Tobacco Use   Smoking status: Every Day    Current packs/day: 1.00    Average packs/day: 1 pack/day for 27.7 years (27.7 ttl pk-yrs)    Types: Cigarettes    Start date: 11/07/1995    Last attempt to quit: 11/06/2020    Passive exposure: Past   Smokeless tobacco: Never   Tobacco comments:    02/22/22 1 PPD  Vaping Use   Vaping status: Never Used  Substance Use Topics   Alcohol use: No   Drug use: Not Currently    Comment: I QUIT ABOUT 20 YRS AGO    Family Medical History: Family History  Problem Relation Age of Onset   Alzheimer's disease Father    Heart disease Father    CAD Neg Hx     Physical Examination: Vitals:   07/31/23 0913  BP: 136/84    General: Patient is  in no apparent distress. Attention to examination is appropriate.  Neck:   Supple.  Full range of motion.  Respiratory: Patient is breathing without any difficulty.   NEUROLOGICAL:     Awake, alert, oriented to person, place, and time.  Speech is clear and fluent.   Cranial Nerves: Pupils equal round and reactive to light.  Facial tone is asymmetric, right sided Bell's palsy with forehead involvement.  Facial sensation is symmetric. Shoulder shrug is symmetric. Tongue protrusion is midline.    Strength: Side Biceps Triceps Deltoid Interossei Grip Wrist Ext. Wrist Flex.  R 5 5 5  4+ 5 5 5   L 5 5 5 5 5 5 5    Reflexes are 2+ and symmetric at the biceps, triceps, brachioradialis, patella and achilles.   Hoffman's is absent. Clonus is absent  Paresthesias noted in the right sided mostly lateral cord distribution     No evidence of dysmetria noted.  Gait is normal.    Imaging: arrative & Impression  CLINICAL DATA:  Pulmonary embolism (PE) suspected, high prob Pulmonary embolism (PE) suspected, high prob. Left-sided chest pain for 1 week.   EXAM: CT ANGIOGRAPHY CHEST WITH CONTRAST    TECHNIQUE: Multidetector CT imaging of the chest was performed using the standard protocol during bolus administration of intravenous contrast. Multiplanar CT image reconstructions and MIPs were obtained to evaluate the vascular anatomy.   RADIATION DOSE REDUCTION: This exam was performed according to the departmental dose-optimization program which includes automated exposure control, adjustment of the mA and/or kV according to patient size and/or use of iterative reconstruction technique.   CONTRAST:  75mL OMNIPAQUE  IOHEXOL  350 MG/ML SOLN   COMPARISON:  CT angiography chest from 10/01/2020.   FINDINGS: Cardiovascular: No evidence of embolism to the proximal subsegmental pulmonary artery level.   Moderate cardiomegaly. No pericardial effusion. No aortic aneurysm. Patient is status post CABG.   Mediastinum/Nodes: Visualized thyroid gland appears grossly unremarkable. No solid / cystic mediastinal masses. The esophagus is nondistended precluding optimal assessment. There are few mildly prominent mediastinal and hilar lymph nodes, which do not meet the size criteria for lymphadenopathy and appear grossly similar to the prior study, favoring benign etiology. No axillary lymphadenopathy by size criteria.   Lungs/Pleura: The central tracheo-bronchial tree is patent. There is small right pleural effusion with associated patchy atelectatic changes at the right lung base. No left pleural effusion. No mass, consolidation or pneumothorax. No suspicious lung nodules.   Upper Abdomen: Visualized upper abdominal viscera within normal limits.   Musculoskeletal: The visualized soft tissues of the chest wall are grossly unremarkable. No suspicious osseous lesions. There are moderate multilevel degenerative changes in the visualized spine.   Review of the MIP images confirms the above findings.   IMPRESSION: 1. No acute embolism to the proximal subsegmental pulmonary artery level. 2.  There is small right pleural effusion with associated patchy atelectatic changes at the right lung base. 3. Moderate cardiomegaly.  No pericardial effusion. 4. Multiple other nonacute observations, as described above.     Electronically Signed   By: Beula Brunswick M.D.   On: 03/24/2023 09:42   SURGICAL PATHOLOGY  [facility]  [address], [address]  [place], [place] [address]  Telephone [phone] or [phone] Fax [phone]   REPORT OF SURGICAL PATHOLOGY    Accession #: [id number]  Patient Name: AUSAR, RIGLER  Visit # : [id number]   MRN: [id number]  Physician: Fernando Hoyer  DOB/Age [birth year] (Age: 75) Gender: M  Collected Date:  07/24/2023  Received Date: 07/24/2023   FINAL DIAGNOSIS        1. Lymph node, needle/core biopsy, right axillary mass 16 g cores :       - SCHWANNOMA WITH HYALINIZED VESSELS.       - NEGATIVE FOR MALIGNANCY.        Diagnosis Note : Per [facility] the patient has a 3.6 cm right axillary mass. The       lesional cells are positive for S100 and SOX10. Ki-67 is low. The pattern of       staining supports the above diagnosis. Stain controls worked appropriately. This       case underwent intradepartmental consultation and Dr. Talmadge Fail concurs with the       interpretation.   I reviewed images from the core needle biopsy, ultrasonic images show a large heterogeneous mass with a core sample taken, just proximal to this appears to be some ovoid type hypodensity also could be a separate nerve sheath tumor, raises suspicion for plexiform mass versus isolated mass.  Differential includes nerve sheath tumor, pathology above prove this as a schwannoma  I have personally reviewed the images and agree with the above interpretation.  Medical Decision Making/Assessment and Plan: Mr. Moskos is a pleasant 67 y.o. male with right sided axillary region schwannoma, symptomatically this seems to be consistent with a lateral cord mass, however  cross-sectional imaging will help us  to further define the location.  Will plan to get an MRI with and without contrast.  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  Physical examination shows good strength, severe Tinel at the axilla and the lateral aspect of the arm and hand both ventral and dorsal.  We discussed resection of the tumor that could cause weakness numbness tingling and that these risks would vary based off of the location of the mass which we will be able to further identify with the MRI.  Once this has been performed we will plan on reaching out to him to schedule resection.  Thank you for involving me in the care of this patient.    Carroll Clamp MD/MSCR Neurosurgery

## 2023-08-10 ENCOUNTER — Other Ambulatory Visit: Payer: Self-pay | Admitting: Nurse Practitioner

## 2023-08-16 ENCOUNTER — Ambulatory Visit: Admitting: Nurse Practitioner

## 2023-08-22 ENCOUNTER — Ambulatory Visit: Attending: Nurse Practitioner

## 2023-08-22 DIAGNOSIS — I255 Ischemic cardiomyopathy: Secondary | ICD-10-CM

## 2023-08-22 LAB — ECHOCARDIOGRAM COMPLETE
AR max vel: 4.66 cm2
AV Area VTI: 3.93 cm2
AV Area mean vel: 4.51 cm2
AV Mean grad: 4 mmHg
AV Peak grad: 7.2 mmHg
Ao pk vel: 1.34 m/s
Area-P 1/2: 3.17 cm2
S' Lateral: 3.4 cm

## 2023-08-23 ENCOUNTER — Ambulatory Visit: Payer: Self-pay | Admitting: Nurse Practitioner

## 2023-08-23 DIAGNOSIS — I255 Ischemic cardiomyopathy: Secondary | ICD-10-CM

## 2023-08-25 ENCOUNTER — Ambulatory Visit
Admission: RE | Admit: 2023-08-25 | Discharge: 2023-08-25 | Disposition: A | Discharge status: Home / Self Care | Source: Ambulatory Visit | Attending: Neurosurgery | Admitting: Neurosurgery

## 2023-08-25 ENCOUNTER — Other Ambulatory Visit: Payer: Self-pay | Admitting: Neurosurgery

## 2023-08-25 DIAGNOSIS — R2231 Localized swelling, mass and lump, right upper limb: Secondary | ICD-10-CM | POA: Diagnosis present

## 2023-08-25 DIAGNOSIS — G510 Bell's palsy: Secondary | ICD-10-CM

## 2023-08-25 MED ORDER — GADOBUTROL 1 MMOL/ML IV SOLN
9.0000 mL | Freq: Once | INTRAVENOUS | Status: AC | PRN
  Administered 2023-08-25: 9 mL via INTRAVENOUS

## 2023-08-30 ENCOUNTER — Ambulatory Visit: Attending: Nurse Practitioner | Admitting: Nurse Practitioner

## 2023-09-01 ENCOUNTER — Encounter: Payer: Self-pay | Admitting: Neurosurgery

## 2023-09-01 DIAGNOSIS — D3612 Benign neoplasm of peripheral nerves and autonomic nervous system, upper limb, including shoulder: Secondary | ICD-10-CM | POA: Insufficient documentation

## 2023-09-15 ENCOUNTER — Telehealth: Payer: Self-pay | Admitting: Cardiovascular Disease

## 2023-09-15 NOTE — Telephone Encounter (Signed)
-----   Message from Nurse Olam SAUNDERS sent at 09/14/2023  7:14 PM EDT ----- Patient no showed his post cath visit.He will need to be rescheduled within the next week or two-thanks.

## 2023-09-15 NOTE — Telephone Encounter (Signed)
 Left message on voice mail to schedule follow up with C. Vivienne

## 2023-09-28 ENCOUNTER — Telehealth: Payer: Self-pay

## 2023-09-28 DIAGNOSIS — D3612 Benign neoplasm of peripheral nerves and autonomic nervous system, upper limb, including shoulder: Secondary | ICD-10-CM

## 2023-09-28 NOTE — Telephone Encounter (Signed)
 Miguel Fletcher left voicemail at 11:13 this morning

## 2023-09-28 NOTE — Telephone Encounter (Signed)
-----   Message from Penne LELON Sharps sent at 09/28/2023  7:27 AM EDT ----- Regarding: RE: Posting Sheet Beta Version Telephone ----- Message ----- From: Jailynn Lavalais, RN Sent: 09/27/2023   5:50 PM EDT To: Penne LELON Sharps, MD; Othelia Gunner, RN Subject: RE: Posting Sheet Beta Version                 Doesn't look like you have spoken with him since he had the MRI? I don't see a note w/ documentation or surgery and risks. Does he need a telephone visit or an in person appointment to discuss? ----- Message ----- From: Sharps Penne LELON, MD Sent: 09/27/2023   4:14 PM EDT To: Othelia Gunner, RN Subject: RE: Posting Sheet Beta Version                 Right-sided axillary nerve sheath tumor ----- Message ----- From: Gunner Othelia, RN Sent: 09/27/2023   3:36 PM EDT To: Penne LELON Sharps, MD Subject: RE: Posting Sheet Beta Version                 Indication for consent? 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

## 2023-09-28 NOTE — Telephone Encounter (Addendum)
 He needs a telephone visit to discuss his MRI results with Dr Claudene and to discuss the surgery and surgery risks/benefits.   He also needs cardiac clearance before surgery. I sent a clearance request to cardiology to start the process for this.  Posting sheet placed in bottom drawer until ready to schedule.

## 2023-09-28 NOTE — Telephone Encounter (Deleted)
 He needs a telephone visit to discuss his MRI results with Dr Claudene and to discuss the surgery and surgery risks/benefits.  He also needs cardiac clearance before surgery. I sent a clearance request to cardiology to start the process for this.

## 2023-09-29 ENCOUNTER — Telehealth: Payer: Self-pay

## 2023-09-29 NOTE — Telephone Encounter (Signed)
   Patient Name: Miguel Fletcher  DOB: February 19, 1956 MRN: [id number]  Primary Cardiologist: Evalene Lunger, MD  Chart reviewed as part of pre-operative protocol coverage. Given past medical history and time since last visit, based on ACC/AHA guidelines, Miguel Fletcher is at acceptable risk for the planned procedure without further cardiovascular testing.   Patient underwent cardiac catheterization recently that showed patent grafts, no culprit lesion.  Subsequent echocardiogram showed normal ejection fraction with severe concentric left ventricular hypertrophy, no significant valvular disease.  Okay to treat with aggressive IV fluid if become hypotensive during the procedure.  Patient may hold aspirin  if needed for 5 to 7 days prior to the procedure and restart as soon as possible afterward at the surgeon's discretion.  If needed, he may also hold Pletal  for 3 to 5 days prior to the procedure and restart afterward.   I will route this recommendation to the requesting party via Epic fax function and remove from pre-op pool.  Please call with questions.  Nadirah Socorro, [place] 09/29/2023, 8:41 AM

## 2023-09-29 NOTE — Telephone Encounter (Signed)
 [facility] at [facility] [address] [place], [place]  [address] Phone:  [phone]   Fax:  [phone]     Patient: Miguel Fletcher      DOB: 07-31-1955     Date sent: 09/28/2023             Faxed to: Davene Nicolas     Surgeon: Penne Sharps, MD      Date of procedure: to be determined once cleared     Planned procedure & Anesthesia Type: resection of right sided axillary nerve sheath tumor/general anesthesia   Should you choose to see this patient in your office to provide clearance, please ask your office staff to contact the patient directly.  Please fax your evaluation and any supporting documentation as soon as completed.  Thank you! Your assistance is greatly appreciated!     *ok to stay on aspirin  81mg      Is this patient optimized to have the above procedure? Yes or No   If NO, please indicate further studies/evaluations recommended     Risk:      Low      Moderate      High     Remarks:      Physician Name: _______________________________     Physician Signature: _______________________________Date:________________    [facility] Neurosurgery at [facility], [phone]                   1

## 2023-10-09 NOTE — Telephone Encounter (Signed)
 I apologize, I did not realize that he was on pletal . We would have him stay on aspirin  81mg  and would typically request that he hold pletal  7 days prior and 7 days after surgery.   I discussed this with Dr Claudene. Based on your documentation that he may hold Pletal  3-5 days prior and restart afterward, he would like to plan to hold pletal  5 days prior and 7 days after. He is OK with him staying on the aspirin  81mg .

## 2023-10-10 NOTE — Telephone Encounter (Signed)
 It is ok to hold pletal  longer if needed.

## 2023-10-16 NOTE — Telephone Encounter (Signed)
 Patient's wife states that he will have the patient call our office back to schedule.

## 2023-10-26 ENCOUNTER — Ambulatory Visit: Admitting: Nurse Practitioner
